# Patient Record
Sex: Female | Born: 1965 | Race: White | Hispanic: No | Marital: Married | Smoking: Former smoker
Health system: Southern US, Community
[De-identification: ages and names within clinical notes are randomized; demographics above are authoritative.]

## PROBLEM LIST (undated history)

## (undated) DIAGNOSIS — E538 Deficiency of other specified B group vitamins: Secondary | ICD-10-CM

## (undated) DIAGNOSIS — R0600 Dyspnea, unspecified: Secondary | ICD-10-CM

## (undated) DIAGNOSIS — K219 Gastro-esophageal reflux disease without esophagitis: Secondary | ICD-10-CM

## (undated) DIAGNOSIS — Z8489 Family history of other specified conditions: Secondary | ICD-10-CM

## (undated) DIAGNOSIS — I739 Peripheral vascular disease, unspecified: Secondary | ICD-10-CM

## (undated) DIAGNOSIS — I493 Ventricular premature depolarization: Secondary | ICD-10-CM

## (undated) DIAGNOSIS — M1990 Unspecified osteoarthritis, unspecified site: Secondary | ICD-10-CM

## (undated) DIAGNOSIS — R2689 Other abnormalities of gait and mobility: Secondary | ICD-10-CM

## (undated) DIAGNOSIS — D509 Iron deficiency anemia, unspecified: Secondary | ICD-10-CM

## (undated) DIAGNOSIS — N028 Recurrent and persistent hematuria with other morphologic changes: Secondary | ICD-10-CM

## (undated) DIAGNOSIS — E039 Hypothyroidism, unspecified: Secondary | ICD-10-CM

## (undated) DIAGNOSIS — I731 Thromboangiitis obliterans [Buerger's disease]: Secondary | ICD-10-CM

## (undated) DIAGNOSIS — E559 Vitamin D deficiency, unspecified: Secondary | ICD-10-CM

## (undated) DIAGNOSIS — D473 Essential (hemorrhagic) thrombocythemia: Secondary | ICD-10-CM

## (undated) DIAGNOSIS — D735 Infarction of spleen: Secondary | ICD-10-CM

## (undated) DIAGNOSIS — D75839 Thrombocytosis, unspecified: Secondary | ICD-10-CM

## (undated) DIAGNOSIS — G4730 Sleep apnea, unspecified: Secondary | ICD-10-CM

## (undated) DIAGNOSIS — K5100 Ulcerative (chronic) pancolitis without complications: Secondary | ICD-10-CM

## (undated) DIAGNOSIS — I10 Essential (primary) hypertension: Secondary | ICD-10-CM

## (undated) HISTORY — DX: Iron deficiency anemia, unspecified: D50.9

## (undated) HISTORY — DX: Essential (hemorrhagic) thrombocythemia: D47.3

## (undated) HISTORY — DX: Thrombocytosis, unspecified: D75.839

## (undated) HISTORY — DX: Thromboangiitis obliterans (Buerger's disease): I73.1

## (undated) HISTORY — PX: APPENDECTOMY: SHX54

## (undated) HISTORY — DX: Deficiency of other specified B group vitamins: E53.8

## (undated) HISTORY — PX: DILATION AND CURETTAGE OF UTERUS: SHX78

## (undated) HISTORY — DX: Ventricular premature depolarization: I49.3

## (undated) HISTORY — PX: CORONARY ARTERY BYPASS GRAFT: SHX141

## (undated) HISTORY — PX: ADENOIDECTOMY: SUR15

## (undated) HISTORY — DX: Unspecified osteoarthritis, unspecified site: M19.90

---
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
## 1995-03-26 DIAGNOSIS — K9189 Other postprocedural complications and disorders of digestive system: Secondary | ICD-10-CM

## 1998-01-14 DIAGNOSIS — K9189 Other postprocedural complications and disorders of digestive system: Secondary | ICD-10-CM

## 2004-07-02 ENCOUNTER — Other Ambulatory Visit: Payer: Self-pay

## 2005-02-28 ENCOUNTER — Emergency Department: Payer: Self-pay | Admitting: Emergency Medicine

## 2005-07-20 ENCOUNTER — Emergency Department: Payer: Self-pay | Admitting: Emergency Medicine

## 2005-07-21 ENCOUNTER — Other Ambulatory Visit: Payer: Self-pay

## 2005-11-02 ENCOUNTER — Ambulatory Visit: Payer: Self-pay

## 2006-10-05 ENCOUNTER — Encounter: Admission: RE | Admit: 2006-10-05 | Discharge: 2006-10-05 | Payer: Self-pay | Admitting: Neurosurgery

## 2008-02-03 ENCOUNTER — Other Ambulatory Visit: Payer: Self-pay

## 2008-02-03 ENCOUNTER — Emergency Department: Payer: Self-pay | Admitting: Emergency Medicine

## 2008-10-08 ENCOUNTER — Ambulatory Visit: Payer: Self-pay | Admitting: Internal Medicine

## 2009-12-13 DIAGNOSIS — N028 Recurrent and persistent hematuria with other morphologic changes: Secondary | ICD-10-CM

## 2009-12-13 DIAGNOSIS — N02B9 Other recurrent and persistent immunoglobulin A nephropathy: Secondary | ICD-10-CM

## 2009-12-13 HISTORY — DX: Recurrent and persistent hematuria with other morphologic changes: N02.8

## 2009-12-13 HISTORY — DX: Other recurrent and persistent immunoglobulin A nephropathy: N02.B9

## 2010-03-24 ENCOUNTER — Emergency Department: Payer: Self-pay | Admitting: Emergency Medicine

## 2010-04-05 ENCOUNTER — Inpatient Hospital Stay: Payer: Self-pay | Admitting: Specialist

## 2010-11-13 ENCOUNTER — Emergency Department: Payer: Self-pay | Admitting: Emergency Medicine

## 2011-07-06 ENCOUNTER — Ambulatory Visit: Payer: Self-pay | Admitting: Internal Medicine

## 2012-09-26 ENCOUNTER — Ambulatory Visit: Payer: Self-pay

## 2012-12-22 ENCOUNTER — Ambulatory Visit: Payer: Self-pay | Admitting: Internal Medicine

## 2012-12-22 LAB — CBC CANCER CENTER
Bands: 1 %
Eosinophil: 2 %
HCT: 32.6 % — ABNORMAL LOW
HGB: 10.5 g/dL — ABNORMAL LOW
Lymphocytes: 24 %
MCH: 24.1 pg — ABNORMAL LOW
MCHC: 32.1 g/dL
MCV: 75 fL — ABNORMAL LOW
Monocytes: 5 %
Platelet: 432 "x10 3/mm "
RBC: 4.35 "x10 6/mm "
RDW: 18.4 % — ABNORMAL HIGH
Segmented Neutrophils: 63 %
Variant Lymphocyte: 5 %
WBC: 10.3 "x10 3/mm "

## 2012-12-22 LAB — IRON AND TIBC
Iron Bind.Cap.(Total): 481 ug/dL — ABNORMAL HIGH
Iron Saturation: 6 %
Iron: 28 ug/dL — ABNORMAL LOW
Unbound Iron-Bind.Cap.: 453 ug/dL

## 2013-01-13 ENCOUNTER — Ambulatory Visit: Payer: Self-pay | Admitting: Internal Medicine

## 2013-02-10 ENCOUNTER — Ambulatory Visit: Payer: Self-pay | Admitting: Internal Medicine

## 2013-03-13 ENCOUNTER — Ambulatory Visit: Payer: Self-pay | Admitting: Internal Medicine

## 2013-03-27 LAB — CBC CANCER CENTER
HCT: 40.3 % (ref 35.0–47.0)
Lymphocyte #: 2.7 x10 3/mm (ref 1.0–3.6)
Lymphocyte %: 32.4 %
MCHC: 32.3 g/dL (ref 32.0–36.0)
MCV: 81 fL (ref 80–100)
Monocyte #: 0.6 x10 3/mm (ref 0.2–0.9)
Monocyte %: 7.2 %
Neutrophil #: 4.9 x10 3/mm (ref 1.4–6.5)
Neutrophil %: 58.3 %
Platelet: 322 x10 3/mm (ref 150–440)
WBC: 8.3 x10 3/mm (ref 3.6–11.0)

## 2013-03-27 LAB — IRON AND TIBC
Iron Bind.Cap.(Total): 427 ug/dL (ref 250–450)
Iron: 41 ug/dL — ABNORMAL LOW (ref 50–170)
Unbound Iron-Bind.Cap.: 386 ug/dL

## 2013-04-12 ENCOUNTER — Ambulatory Visit: Payer: Self-pay | Admitting: Internal Medicine

## 2013-05-11 DIAGNOSIS — E039 Hypothyroidism, unspecified: Secondary | ICD-10-CM | POA: Insufficient documentation

## 2013-05-11 DIAGNOSIS — I10 Essential (primary) hypertension: Secondary | ICD-10-CM | POA: Insufficient documentation

## 2013-06-04 DIAGNOSIS — Z6841 Body Mass Index (BMI) 40.0 and over, adult: Secondary | ICD-10-CM | POA: Insufficient documentation

## 2013-06-04 DIAGNOSIS — I731 Thromboangiitis obliterans [Buerger's disease]: Secondary | ICD-10-CM | POA: Insufficient documentation

## 2013-06-04 DIAGNOSIS — G4730 Sleep apnea, unspecified: Secondary | ICD-10-CM | POA: Insufficient documentation

## 2013-06-04 DIAGNOSIS — K5100 Ulcerative (chronic) pancolitis without complications: Secondary | ICD-10-CM | POA: Insufficient documentation

## 2013-06-04 DIAGNOSIS — K219 Gastro-esophageal reflux disease without esophagitis: Secondary | ICD-10-CM | POA: Insufficient documentation

## 2013-06-12 ENCOUNTER — Ambulatory Visit: Payer: Self-pay | Admitting: Internal Medicine

## 2013-08-15 ENCOUNTER — Ambulatory Visit: Payer: Self-pay | Admitting: Internal Medicine

## 2013-08-15 LAB — CBC CANCER CENTER
Basophil #: 0 x10 3/mm (ref 0.0–0.1)
Basophil %: 0.6 %
Eosinophil #: 0.1 x10 3/mm (ref 0.0–0.7)
Eosinophil %: 1.7 %
HCT: 41.2 % (ref 35.0–47.0)
Lymphocyte %: 33.4 %
MCH: 29.2 pg (ref 26.0–34.0)
MCHC: 33.3 g/dL (ref 32.0–36.0)
Monocyte #: 0.5 x10 3/mm (ref 0.2–0.9)
Monocyte %: 6.5 %
Neutrophil #: 4.7 x10 3/mm (ref 1.4–6.5)
Neutrophil %: 57.8 %
RDW: 14.4 % (ref 11.5–14.5)
WBC: 8.1 x10 3/mm (ref 3.6–11.0)

## 2013-08-15 LAB — IRON AND TIBC
Iron Saturation: 22 %
Iron: 95 ug/dL (ref 50–170)

## 2013-09-12 ENCOUNTER — Ambulatory Visit: Payer: Self-pay | Admitting: Internal Medicine

## 2013-10-03 ENCOUNTER — Ambulatory Visit: Payer: Self-pay

## 2013-10-19 ENCOUNTER — Ambulatory Visit: Payer: Self-pay

## 2014-10-15 ENCOUNTER — Ambulatory Visit: Payer: Self-pay | Admitting: Internal Medicine

## 2014-11-12 ENCOUNTER — Ambulatory Visit: Payer: Self-pay | Admitting: Internal Medicine

## 2014-11-12 ENCOUNTER — Ambulatory Visit: Payer: Self-pay

## 2015-01-03 ENCOUNTER — Ambulatory Visit: Payer: Self-pay | Admitting: Internal Medicine

## 2015-01-08 LAB — CBC CANCER CENTER
BASOS ABS: 0.1 x10 3/mm (ref 0.0–0.1)
BASOS PCT: 1.1 %
EOS ABS: 0.1 x10 3/mm (ref 0.0–0.7)
Eosinophil %: 1.1 %
HCT: 43.7 % (ref 35.0–47.0)
HGB: 13.8 g/dL (ref 12.0–16.0)
LYMPHS PCT: 25.6 %
Lymphocyte #: 2.5 x10 3/mm (ref 1.0–3.6)
MCH: 27 pg (ref 26.0–34.0)
MCHC: 31.7 g/dL — AB (ref 32.0–36.0)
MCV: 85 fL (ref 80–100)
MONOS PCT: 4.7 %
Monocyte #: 0.5 x10 3/mm (ref 0.2–0.9)
NEUTROS PCT: 67.5 %
Neutrophil #: 6.6 x10 3/mm — ABNORMAL HIGH (ref 1.4–6.5)
Platelet: 337 x10 3/mm (ref 150–440)
RBC: 5.13 10*6/uL (ref 3.80–5.20)
RDW: 19.1 % — AB (ref 11.5–14.5)
WBC: 9.8 x10 3/mm (ref 3.6–11.0)

## 2015-01-08 LAB — IRON AND TIBC
Iron Bind.Cap.(Total): 417 ug/dL (ref 250–450)
Iron Saturation: 13 %
Iron: 55 ug/dL (ref 50–170)
Unbound Iron-Bind.Cap.: 362 ug/dL

## 2015-01-08 LAB — FERRITIN: Ferritin (ARMC): 41 ng/mL (ref 8–388)

## 2015-01-13 ENCOUNTER — Ambulatory Visit: Payer: Self-pay | Admitting: Internal Medicine

## 2015-04-23 ENCOUNTER — Other Ambulatory Visit: Payer: Self-pay

## 2015-04-29 ENCOUNTER — Encounter
Admission: RE | Admit: 2015-04-29 | Discharge: 2015-04-29 | Disposition: A | Discharge status: Home / Self Care | Payer: Managed Care, Other (non HMO) | Source: Ambulatory Visit | Attending: Obstetrics and Gynecology | Admitting: Obstetrics and Gynecology

## 2015-04-29 DIAGNOSIS — K219 Gastro-esophageal reflux disease without esophagitis: Secondary | ICD-10-CM | POA: Diagnosis not present

## 2015-04-29 DIAGNOSIS — N939 Abnormal uterine and vaginal bleeding, unspecified: Secondary | ICD-10-CM | POA: Diagnosis present

## 2015-04-29 DIAGNOSIS — Z7982 Long term (current) use of aspirin: Secondary | ICD-10-CM | POA: Diagnosis not present

## 2015-04-29 DIAGNOSIS — F17200 Nicotine dependence, unspecified, uncomplicated: Secondary | ICD-10-CM | POA: Diagnosis not present

## 2015-04-29 DIAGNOSIS — I731 Thromboangiitis obliterans [Buerger's disease]: Secondary | ICD-10-CM | POA: Diagnosis not present

## 2015-04-29 DIAGNOSIS — Z8249 Family history of ischemic heart disease and other diseases of the circulatory system: Secondary | ICD-10-CM | POA: Diagnosis not present

## 2015-04-29 DIAGNOSIS — G4730 Sleep apnea, unspecified: Secondary | ICD-10-CM | POA: Diagnosis not present

## 2015-04-29 DIAGNOSIS — E039 Hypothyroidism, unspecified: Secondary | ICD-10-CM | POA: Diagnosis not present

## 2015-04-29 DIAGNOSIS — Z808 Family history of malignant neoplasm of other organs or systems: Secondary | ICD-10-CM | POA: Diagnosis not present

## 2015-04-29 DIAGNOSIS — N8501 Benign endometrial hyperplasia: Secondary | ICD-10-CM | POA: Diagnosis not present

## 2015-04-29 DIAGNOSIS — Z6841 Body Mass Index (BMI) 40.0 and over, adult: Secondary | ICD-10-CM | POA: Diagnosis not present

## 2015-04-29 DIAGNOSIS — Z79899 Other long term (current) drug therapy: Secondary | ICD-10-CM | POA: Diagnosis not present

## 2015-04-29 DIAGNOSIS — I10 Essential (primary) hypertension: Secondary | ICD-10-CM | POA: Diagnosis not present

## 2015-04-29 DIAGNOSIS — E669 Obesity, unspecified: Secondary | ICD-10-CM | POA: Diagnosis not present

## 2015-04-29 DIAGNOSIS — D649 Anemia, unspecified: Secondary | ICD-10-CM | POA: Diagnosis not present

## 2015-04-29 DIAGNOSIS — Z8349 Family history of other endocrine, nutritional and metabolic diseases: Secondary | ICD-10-CM | POA: Diagnosis not present

## 2015-04-29 HISTORY — DX: Gastro-esophageal reflux disease without esophagitis: K21.9

## 2015-04-29 HISTORY — DX: Essential (primary) hypertension: I10

## 2015-04-29 HISTORY — DX: Hypothyroidism, unspecified: E03.9

## 2015-04-29 HISTORY — DX: Recurrent and persistent hematuria with other morphologic changes: N02.8

## 2015-04-29 HISTORY — DX: Family history of other specified conditions: Z84.89

## 2015-04-29 LAB — BASIC METABOLIC PANEL
Anion gap: 9 (ref 5–15)
BUN: 11 mg/dL (ref 6–20)
CHLORIDE: 101 mmol/L (ref 101–111)
CO2: 28 mmol/L (ref 22–32)
CREATININE: 0.68 mg/dL (ref 0.44–1.00)
Calcium: 8.8 mg/dL — ABNORMAL LOW (ref 8.9–10.3)
GFR calc non Af Amer: >60 mL/min (ref >60)
GLUCOSE: 86 mg/dL (ref 65–99)
POTASSIUM: 4.1 mmol/L (ref 3.5–5.1)
Sodium: 138 mmol/L (ref 135–145)

## 2015-04-29 LAB — DIFFERENTIAL
BASOS PCT: 1 %
Basophils Absolute: 0.1 10*3/uL (ref 0–0.1)
Eosinophils Absolute: 0.1 10*3/uL (ref 0–0.7)
Eosinophils Relative: 1 %
Lymphocytes Relative: 24 %
Lymphs Abs: 3.2 10*3/uL (ref 1.0–3.6)
MONO ABS: 0.8 10*3/uL (ref 0.2–0.9)
MONOS PCT: 6 %
Neutro Abs: 9 10*3/uL — ABNORMAL HIGH (ref 1.4–6.5)
Neutrophils Relative %: 68 %

## 2015-04-29 LAB — CBC
HCT: 41.7 % (ref 35.0–47.0)
HEMOGLOBIN: 13.4 g/dL (ref 12.0–16.0)
MCH: 28.5 pg (ref 26.0–34.0)
MCHC: 32.1 g/dL (ref 32.0–36.0)
MCV: 88.8 fL (ref 80.0–100.0)
Platelets: 357 10*3/uL (ref 150–440)
RBC: 4.69 MIL/uL (ref 3.80–5.20)
RDW: 15.8 % — ABNORMAL HIGH (ref 11.5–14.5)
WBC: 13.2 10*3/uL — AB (ref 3.6–11.0)

## 2015-04-29 NOTE — Patient Instructions (Signed)
  Your procedure is scheduled on: May 02, 2015 Report to Same Day Surgery. To find out your arrival time please call [phone] between 1PM - 3PM on May 01, 2015.  Remember: Instructions that are not followed completely may result in serious medical risk, up to and including death, or upon the discretion of your surgeon and anesthesiologist your surgery may need to be rescheduled.    __x__ 1. Do not eat food or drink liquids after midnight. No gum chewing or hard candies.     __x__ 2. No Alcohol for 24 hours before or after surgery.   ____ 3. Bring all medications with you on the day of surgery if instructed.    __x__ 4. Notify your doctor if there is any change in your medical condition     (cold, fever, infections).     Do not wear jewelry, make-up, hairpins, clips or nail polish.  Do not wear lotions, powders, or perfumes. You may wear deodorant.  Do not shave 48 hours prior to surgery. Men may shave face and neck.  Do not bring valuables to the hospital.    [facility] is not responsible for any belongings or valuables.               Contacts, dentures or bridgework may not be worn into surgery.  Leave your suitcase in the car. After surgery it may be brought to your room.  For patients admitted to the hospital, discharge time is determined by your treatment team.   Patients discharged the day of surgery will not be allowed to drive home.   Please read over the following fact sheets that you were given:     __x__ Take these medicines the morning of surgery with A SIP OF WATER:    1. Levothyroxine Sodium (LEVOXYL PO)     ____ Fleet Enema (as directed)   ____ Use CHG Soap as directed  ____ Use inhalers on the day of surgery  ____ Stop metformin 2 days prior to surgery    ____ Take 1/2 of usual insulin dose the night before surgery and none on the morning of surgery.   ____ Stop Coumadin/Plavix/aspirin on Does not apply.  ____ Stop Anti-inflammatories on does not  apply, patient does not take.   ____ Stop supplements until after surgery.    ____ Bring C-Pap to the hospital.

## 2015-05-02 ENCOUNTER — Ambulatory Visit: Payer: Managed Care, Other (non HMO) | Admitting: Anesthesiology

## 2015-05-02 ENCOUNTER — Encounter: Payer: Self-pay | Admitting: *Deleted

## 2015-05-02 ENCOUNTER — Encounter
Admission: RE | Disposition: A | Discharge status: Home / Self Care | Payer: Self-pay | Source: Ambulatory Visit | Attending: Obstetrics and Gynecology

## 2015-05-02 ENCOUNTER — Ambulatory Visit
Admission: RE | Admit: 2015-05-02 | Discharge: 2015-05-02 | Disposition: A | Discharge status: Home / Self Care | Payer: Managed Care, Other (non HMO) | Source: Ambulatory Visit | Attending: Obstetrics and Gynecology | Admitting: Obstetrics and Gynecology

## 2015-05-02 DIAGNOSIS — E039 Hypothyroidism, unspecified: Secondary | ICD-10-CM | POA: Insufficient documentation

## 2015-05-02 DIAGNOSIS — Z6841 Body Mass Index (BMI) 40.0 and over, adult: Secondary | ICD-10-CM | POA: Insufficient documentation

## 2015-05-02 DIAGNOSIS — Z79899 Other long term (current) drug therapy: Secondary | ICD-10-CM | POA: Insufficient documentation

## 2015-05-02 DIAGNOSIS — I10 Essential (primary) hypertension: Secondary | ICD-10-CM | POA: Insufficient documentation

## 2015-05-02 DIAGNOSIS — N8501 Benign endometrial hyperplasia: Secondary | ICD-10-CM | POA: Insufficient documentation

## 2015-05-02 DIAGNOSIS — Z8249 Family history of ischemic heart disease and other diseases of the circulatory system: Secondary | ICD-10-CM | POA: Insufficient documentation

## 2015-05-02 DIAGNOSIS — D649 Anemia, unspecified: Secondary | ICD-10-CM | POA: Insufficient documentation

## 2015-05-02 DIAGNOSIS — E669 Obesity, unspecified: Secondary | ICD-10-CM | POA: Insufficient documentation

## 2015-05-02 DIAGNOSIS — G4730 Sleep apnea, unspecified: Secondary | ICD-10-CM | POA: Insufficient documentation

## 2015-05-02 DIAGNOSIS — I731 Thromboangiitis obliterans [Buerger's disease]: Secondary | ICD-10-CM | POA: Insufficient documentation

## 2015-05-02 DIAGNOSIS — K219 Gastro-esophageal reflux disease without esophagitis: Secondary | ICD-10-CM | POA: Insufficient documentation

## 2015-05-02 DIAGNOSIS — Z7982 Long term (current) use of aspirin: Secondary | ICD-10-CM | POA: Insufficient documentation

## 2015-05-02 DIAGNOSIS — Z8349 Family history of other endocrine, nutritional and metabolic diseases: Secondary | ICD-10-CM | POA: Insufficient documentation

## 2015-05-02 DIAGNOSIS — N939 Abnormal uterine and vaginal bleeding, unspecified: Secondary | ICD-10-CM | POA: Insufficient documentation

## 2015-05-02 DIAGNOSIS — F17200 Nicotine dependence, unspecified, uncomplicated: Secondary | ICD-10-CM | POA: Insufficient documentation

## 2015-05-02 DIAGNOSIS — Z808 Family history of malignant neoplasm of other organs or systems: Secondary | ICD-10-CM | POA: Insufficient documentation

## 2015-05-02 HISTORY — PX: DILATATION & CURETTAGE/HYSTEROSCOPY WITH MYOSURE: SHX6511

## 2015-05-02 LAB — POCT PREGNANCY, URINE: PREG TEST UR: NEGATIVE

## 2015-05-02 SURGERY — DILATATION & CURETTAGE/HYSTEROSCOPY WITH MYOSURE
Anesthesia: General

## 2015-05-02 MED ORDER — FENTANYL CITRATE (PF) 100 MCG/2ML IJ SOLN
INTRAMUSCULAR | Status: DC | PRN
  Administered 2015-05-02: 50 ug via INTRAVENOUS
  Administered 2015-05-02 (×2): 25 ug via INTRAVENOUS

## 2015-05-02 MED ORDER — FAMOTIDINE 20 MG PO TABS
ORAL_TABLET | ORAL | Status: AC
  Administered 2015-05-02: 20 mg via ORAL
  Filled 2015-05-02: qty 1

## 2015-05-02 MED ORDER — LACTATED RINGERS IV SOLN
INTRAVENOUS | Status: DC
  Administered 2015-05-02: 12:00:00 via INTRAVENOUS

## 2015-05-02 MED ORDER — ONDANSETRON HCL 4 MG/2ML IJ SOLN
INTRAMUSCULAR | Status: DC | PRN
  Administered 2015-05-02: 4 mg via INTRAVENOUS

## 2015-05-02 MED ORDER — DEXAMETHASONE SODIUM PHOSPHATE 10 MG/ML IJ SOLN
INTRAMUSCULAR | Status: DC | PRN
  Administered 2015-05-02: 8 mg via INTRAVENOUS

## 2015-05-02 MED ORDER — KETOROLAC TROMETHAMINE 30 MG/ML IJ SOLN
INTRAMUSCULAR | Status: DC | PRN
  Administered 2015-05-02: 30 mg via INTRAVENOUS

## 2015-05-02 MED ORDER — LIDOCAINE HCL (CARDIAC) 20 MG/ML IV SOLN
INTRAVENOUS | Status: DC | PRN
  Administered 2015-05-02: 80 mg via INTRAVENOUS

## 2015-05-02 MED ORDER — HYDROCODONE-ACETAMINOPHEN 5-325 MG PO TABS: 1.0000 | ORAL_TABLET | Freq: Four times a day (QID) | ORAL | Status: DC | PRN

## 2015-05-02 MED ORDER — IBUPROFEN 600 MG PO TABS: 600.0000 mg | ORAL_TABLET | Freq: Four times a day (QID) | ORAL | Status: DC | PRN

## 2015-05-02 MED ORDER — FAMOTIDINE 20 MG PO TABS
20.0000 mg | ORAL_TABLET | Freq: Once | ORAL | Status: AC
  Administered 2015-05-02: 20 mg via ORAL

## 2015-05-02 MED ORDER — MIDAZOLAM HCL 5 MG/5ML IJ SOLN
INTRAMUSCULAR | Status: DC | PRN
  Administered 2015-05-02: 2 mg via INTRAVENOUS

## 2015-05-02 MED ORDER — ACETAMINOPHEN 10 MG/ML IV SOLN
INTRAVENOUS | Status: AC
  Filled 2015-05-02: qty 100

## 2015-05-02 MED ORDER — ONDANSETRON HCL 4 MG/2ML IJ SOLN: 4.0000 mg | Freq: Once | INTRAMUSCULAR | Status: DC | PRN

## 2015-05-02 MED ORDER — ACETAMINOPHEN 10 MG/ML IV SOLN
INTRAVENOUS | Status: DC | PRN
  Administered 2015-05-02: 1000 mg via INTRAVENOUS

## 2015-05-02 MED ORDER — PROPOFOL 10 MG/ML IV BOLUS
INTRAVENOUS | Status: DC | PRN
  Administered 2015-05-02: 200 mg via INTRAVENOUS

## 2015-05-02 MED ORDER — HYDROMORPHONE HCL 1 MG/ML IJ SOLN: 0.2500 mg | INTRAMUSCULAR | Status: DC | PRN

## 2015-05-02 MED ORDER — FENTANYL CITRATE (PF) 100 MCG/2ML IJ SOLN: 25.0000 ug | INTRAMUSCULAR | Status: DC | PRN

## 2015-05-02 SURGICAL SUPPLY — 22 items
ABLATOR ENDOMETRIAL MYOSURE (ABLATOR) ×3 IMPLANT
CANISTER SUC SOCK COL 7IN (MISCELLANEOUS) ×3 IMPLANT
CATH ROBINSON RED A/P 16FR (CATHETERS) ×3 IMPLANT
COVER LIGHT HANDLE STERIS (MISCELLANEOUS) ×3 IMPLANT
GLOVE BIO SURGEON STRL SZ7 (GLOVE) ×3 IMPLANT
GOWN STRL REUS W/ TWL LRG LVL3 (GOWN DISPOSABLE) ×2 IMPLANT
GOWN STRL REUS W/TWL LRG LVL3 (GOWN DISPOSABLE) ×4
IV NS 1000ML (IV SOLUTION) ×4
IV NS 1000ML BAXH (IV SOLUTION) ×2 IMPLANT
KIT RM TURNOVER CYSTO AR (KITS) ×3 IMPLANT
MYOSURE LITE POLYP REMOVAL (MISCELLANEOUS) ×3 IMPLANT
PACK DNC HYST (MISCELLANEOUS) ×3 IMPLANT
PAD GROUND ADULT SPLIT (MISCELLANEOUS) ×3 IMPLANT
PAD OB MATERNITY 4.3X12.25 (PERSONAL CARE ITEMS) ×3 IMPLANT
PAD PREP 24X41 OB/GYN DISP (PERSONAL CARE ITEMS) ×3 IMPLANT
SEAL ROD LENS SCOPE MYOSURE (ABLATOR) ×3 IMPLANT
SOL .9 NS 3000ML IRR  AL (IV SOLUTION)
SOL .9 NS 3000ML IRR UROMATIC (IV SOLUTION) IMPLANT
TOWEL OR 17X26 4PK STRL BLUE (TOWEL DISPOSABLE) ×3 IMPLANT
TUBING CONNECTING 10 (TUBING) ×2 IMPLANT
TUBING CONNECTING 10' (TUBING) ×1
TUBING HYSTEROSCOPY DOLPHIN (MISCELLANEOUS) IMPLANT

## 2015-05-02 NOTE — Transfer of Care (Signed)
Immediate Anesthesia Transfer of Care Note  Patient: [name]  Procedure(s) Performed: Procedure(s): DILATATION & CURETTAGE/HYSTEROSCOPY WITH MYOSURE (N/A)  Patient Location: PACU  Anesthesia Type:General  Level of Consciousness: awake, alert  and oriented  Airway & Oxygen Therapy: Patient Spontanous Breathing and Patient connected to face mask oxygen  Post-op Assessment: Report given to RN, Post -op Vital signs reviewed and stable and Patient moving all extremities X 4  Post vital signs: Reviewed and stable  Last Vitals:  Filed Vitals:   05/02/15 1109  BP: 123/76  Pulse: 83  Temp: 37.2 C  Resp: 16    Complications: No apparent anesthesia complications

## 2015-05-02 NOTE — Op Note (Signed)
Patient Name: [name] Date of Procedure: 05/02/2015  Preoperative Diagnosis: 1) 49 y.o. with abnormal uterine bleeding, polypoid fragment on in office D&C  Postoperative Diagnosis: 1) 49 y.o. with abnormal uterine bleeding, normal cavity contour  Operation Performed: Hysteroscopy, dilation and curettage  Indication: abnormal uterine bleeding, concern for endometrial polyp on in office biopsy  Anesthesia: .General  Primary Surgeon: Malachy Mood, MD  Assistant: none  Preoperative Antibiotics: none  Estimated Blood Loss: * No blood loss amount entered *  IV Fluids: 486mL  Urine Output:: ~16mL straight cath  Drains or Tubes: none  Implants: none  Specimens Removed: endometrial curettings  Complications: none  Intraoperative Findings:  Normal uterine cavity, grossly normal endometrial lining  Patient Condition: stable  Procedure in Detail:  Patient was taken to the operating room were she was administered general endotracheal anesthesia.  She was positioned in the dorsal lithotomy position utilizing Allen stirups, prepped and draped in the usual sterile fashion.  Bimanual exam limited by habitus.   Prior to proceeding with the case a time out was performed.  Attention was turned to the patient's pelvis.  A red rubber catheter was used to empty the patient's bladder.  An operative speculum was placed to allow visualization of the cervix.  The anterior lip of the cervix was grasped with a single tooth tenaculum and the the cervix was dilated using direct entry with the myosure hysteroscope.  The hysteroscope was then advanced into the uterine cavity, the operative speculum had to be removed secondary to patient's habitus allowing the scope to be advanced sufficiently into the uterine cavity.   Inspection noted the above findings.  Curettage was performed using the mysure system and the resulting specimen collected and sent to pathology.    The single tooth tenaculum was  removed from the cervix.  The tenaculum sites and cervix were noted to be  Hemostatic before removing the operative speculum.  Sponge needle and instrument counts were corrects times two.  The patient tolerated the procedure well and was taken to the recovery room in stable condition.

## 2015-05-02 NOTE — Discharge Summary (Signed)
  Gynecology Physician Postoperative Discharge Summary  Patient ID: ALIYAH ABEYTA MRN: [id number] DOB/AGE: 07/30/1966 49 y.o.  Admit Date: 05/02/2015 Discharge Date: 05/02/2015  Preoperative Diagnoses: Endometrial polyp, abnormal uterine bleeding  Procedures: Procedure(s) (LRB): DILATATION & CURETTAGE/HYSTEROSCOPY WITH MYOSURE (N/A)  CBC Latest Ref Rng 04/29/2015 01/08/2015 08/15/2013  WBC 3.6 - 11.0 K/uL 13.2(H) 9.8 8.1  Hemoglobin 12.0 - 16.0 g/dL 13.4 13.8 13.7  Hematocrit 35.0 - 47.0 % 41.7 43.7 41.2  Platelets 150 - 440 K/uL 357 337 319    Hospital Course:  SHEMAIAH ROUND is a 49 y.o. No obstetric history on file.  admitted for scheduled surgery.  She underwent the procedures as mentioned above, her operation was uncomplicated. For further details about surgery, please refer to the operative report. Patient had an uncomplicated postoperative course. By time of discharge on POD#0, her pain was controlled on oral pain medications; she was ambulating, voiding without difficulty, tolerating regular diet and passing flatus. She was deemed stable for discharge to home.   Discharged Condition: Stable  Disposition: home     Medication List    TAKE these medications        amLODipine 10 MG tablet  Commonly known as:  NORVASC  Take 10 mg by mouth at bedtime.     aspirin 81 MG tablet  Take 81 mg by mouth at bedtime.     folic acid 1 MG tablet  Commonly known as:  FOLVITE  Take 1 mg by mouth at bedtime.     HYDROcodone-acetaminophen 5-325 MG per tablet  Commonly known as:  NORCO/VICODIN  Take 1 tablet by mouth every 6 (six) hours as needed.     ibuprofen 600 MG tablet  Commonly known as:  ADVIL,MOTRIN  Take 1 tablet (600 mg total) by mouth every 6 (six) hours as needed.     LEVOXYL PO  Take 225 mcg by mouth every morning.     omeprazole 40 MG capsule  Commonly known as:  PRILOSEC  Take 40 mg by mouth at bedtime.     VITAMIN D3 COMPLETE PO  Take 1 capsule by mouth 2 (two)  times a week.

## 2015-05-02 NOTE — Anesthesia Postprocedure Evaluation (Signed)
  Anesthesia Post-op Note  Patient: [name]  Procedure(s) Performed: Procedure(s): DILATATION & CURETTAGE/HYSTEROSCOPY WITH MYOSURE (N/A)  Anesthesia type:General LMA  Patient location: PACU  Post pain: Pain level controlled  Post assessment: Post-op Vital signs reviewed, Patient's Cardiovascular Status Stable, Respiratory Function Stable, Patent Airway and No signs of Nausea or vomiting  Post vital signs: Reviewed and stable  Last Vitals:  Filed Vitals:   05/02/15 1356  BP: 128/79  Pulse: 76  Temp: 36.4 C  Resp:     Level of consciousness: awake, alert  and patient cooperative  Complications: No apparent anesthesia complications

## 2015-05-02 NOTE — Discharge Instructions (Signed)
AMBULATORY SURGERY  DISCHARGE INSTRUCTIONS   1) The drugs that you were given will stay in your system until tomorrow so for the next 24 hours you should not:  A) Drive an automobile B) Make any legal decisions C) Drink any alcoholic beverage   2) You may resume regular meals tomorrow.  Today it is better to start with liquids and gradually work up to solid foods.  You may eat anything you prefer, but it is better to start with liquids, then soup and crackers, and gradually work up to solid foods.   3) Please notify your doctor immediately if you have any unusual bleeding, trouble breathing, redness and pain at the surgery site, drainage, fever, or pain not relieved by medication. 4)   5) Your post-operative visit with Dr.    George Ina                                 is: Date:                        Time:    Please call to schedule your post-operative visit.  6) Additional Instructions: 7)

## 2015-05-02 NOTE — H&P (Signed)
Date of Initial H&P: 04/29/2015  History reviewed, patient examined, no change in status, stable for surgery.

## 2015-05-02 NOTE — Progress Notes (Signed)
preop report given to Gustavo Lah, RN by Phillips Grout, RN

## 2015-05-02 NOTE — Brief Op Note (Signed)
05/02/2015  12:42 PM  PATIENT:  [name]  49 y.o. female  PRE-OPERATIVE DIAGNOSIS:  Endometrial polyp  POST-OPERATIVE DIAGNOSIS:  same  PROCEDURE:  Procedure(s): DILATATION & CURETTAGE/HYSTEROSCOPY WITH MYOSURE (N/A)  SURGEON:  Surgeon(s) and Role:    * Malachy Mood, MD - Primary  PHYSICIAN ASSISTANT: none  ASSISTANTS: none  ANESTHESIA:   general  EBL:     BLOOD ADMINISTERED:none  DRAINS: none   LOCAL MEDICATIONS USED:  NONE  SPECIMEN:  Source of Specimen:  endometrial curretings  DISPOSITION OF SPECIMEN:  PATHOLOGY  COUNTS:  YES  TOURNIQUET:  * No tourniquets in log *  DICTATION: .Note written in EPIC  PLAN OF CARE: Discharge to home after PACU  PATIENT DISPOSITION:  PACU - hemodynamically stable.   Delay start of Pharmacological VTE agent (>24hrs) due to surgical blood loss or risk of bleeding: no

## 2015-05-02 NOTE — Anesthesia Procedure Notes (Signed)
Procedure Name: LMA Insertion Date/Time: 05/02/2015 12:11 PM Performed by: Delaney Meigs Pre-anesthesia Checklist: Patient identified, Emergency Drugs available, Suction available, Patient being monitored and Timeout performed Patient Re-evaluated:Patient Re-evaluated prior to inductionOxygen Delivery Method: Circle system utilized Preoxygenation: Pre-oxygenation with 100% oxygen Intubation Type: IV induction Ventilation: Mask ventilation without difficulty LMA: LMA inserted LMA Size: 3.5 Number of attempts: 1 Placement Confirmation: positive ETCO2 and breath sounds checked- equal and bilateral Tube secured with: Tape Dental Injury: Teeth and Oropharynx as per pre-operative assessment

## 2015-05-02 NOTE — Anesthesia Preprocedure Evaluation (Signed)
Anesthesia Evaluation  Patient identified by MRN, date of birth, ID band Patient awake    Reviewed: Allergy & Precautions, H&P , NPO status , Patient's Chart, lab work & pertinent test results, reviewed documented beta blocker date and time   Airway Mallampati: II  TM Distance: >3 FB Neck ROM: full    Dental   Pulmonary sleep apnea and Continuous Positive Airway Pressure Ventilation , Current Smoker, former smoker,          Cardiovascular hypertension, Rate:Normal     Neuro/Psych    GI/Hepatic GERD-  ,  Endo/Other    Renal/GU      Musculoskeletal   Abdominal   Peds  Hematology  (+) anemia ,   Anesthesia Other Findings   Reproductive/Obstetrics                             Anesthesia Physical Anesthesia Plan  ASA: III  Anesthesia Plan: General LMA   Post-op Pain Management:    Induction:   Airway Management Planned:   Additional Equipment:   Intra-op Plan:   Post-operative Plan:   Informed Consent: I have reviewed the patients History and Physical, chart, labs and discussed the procedure including the risks, benefits and alternatives for the proposed anesthesia with the patient or authorized representative who has indicated his/her understanding and acceptance.     Plan Discussed with: CRNA  Anesthesia Plan Comments:         Anesthesia Quick Evaluation

## 2015-05-05 ENCOUNTER — Encounter: Payer: Self-pay | Admitting: Obstetrics and Gynecology

## 2015-05-07 LAB — SURGICAL PATHOLOGY

## 2015-06-24 ENCOUNTER — Other Ambulatory Visit: Payer: Self-pay

## 2015-06-24 DIAGNOSIS — D509 Iron deficiency anemia, unspecified: Secondary | ICD-10-CM

## 2015-06-25 ENCOUNTER — Inpatient Hospital Stay: Payer: Managed Care, Other (non HMO) | Attending: Internal Medicine

## 2015-09-17 ENCOUNTER — Inpatient Hospital Stay: Payer: Managed Care, Other (non HMO) | Attending: Internal Medicine

## 2015-10-16 ENCOUNTER — Inpatient Hospital Stay: Payer: Managed Care, Other (non HMO) | Attending: Internal Medicine

## 2015-10-16 DIAGNOSIS — N840 Polyp of corpus uteri: Secondary | ICD-10-CM | POA: Insufficient documentation

## 2015-10-16 DIAGNOSIS — D509 Iron deficiency anemia, unspecified: Secondary | ICD-10-CM

## 2015-10-16 DIAGNOSIS — N939 Abnormal uterine and vaginal bleeding, unspecified: Secondary | ICD-10-CM | POA: Diagnosis present

## 2015-10-16 LAB — IRON AND TIBC
IRON: 39 ug/dL (ref 28–170)
Saturation Ratios: 10 % — ABNORMAL LOW (ref 10.4–31.8)
TIBC: 408 ug/dL (ref 250–450)
UIBC: 369 ug/dL

## 2015-10-16 LAB — CBC
HCT: 42.3 % (ref 35.0–47.0)
Hemoglobin: 13.8 g/dL (ref 12.0–16.0)
MCH: 27.6 pg (ref 26.0–34.0)
MCHC: 32.7 g/dL (ref 32.0–36.0)
MCV: 84.5 fL (ref 80.0–100.0)
PLATELETS: 359 10*3/uL (ref 150–440)
RBC: 5.01 MIL/uL (ref 3.80–5.20)
RDW: 15.5 % — AB (ref 11.5–14.5)
WBC: 12.9 10*3/uL — ABNORMAL HIGH (ref 3.6–11.0)

## 2015-10-16 LAB — FERRITIN: FERRITIN: 26 ng/mL (ref 11–307)

## 2015-10-21 ENCOUNTER — Other Ambulatory Visit: Payer: Self-pay | Admitting: *Deleted

## 2015-10-21 DIAGNOSIS — D509 Iron deficiency anemia, unspecified: Secondary | ICD-10-CM

## 2015-12-24 ENCOUNTER — Ambulatory Visit: Payer: Self-pay | Admitting: Internal Medicine

## 2015-12-24 ENCOUNTER — Other Ambulatory Visit: Payer: Self-pay

## 2015-12-30 ENCOUNTER — Other Ambulatory Visit: Payer: Self-pay | Admitting: *Deleted

## 2015-12-30 DIAGNOSIS — D509 Iron deficiency anemia, unspecified: Secondary | ICD-10-CM

## 2015-12-31 ENCOUNTER — Encounter: Payer: Self-pay | Admitting: *Deleted

## 2016-01-01 ENCOUNTER — Inpatient Hospital Stay: Payer: Managed Care, Other (non HMO) | Admitting: Internal Medicine

## 2016-01-01 ENCOUNTER — Inpatient Hospital Stay: Payer: Managed Care, Other (non HMO)

## 2016-01-12 ENCOUNTER — Inpatient Hospital Stay: Payer: Managed Care, Other (non HMO)

## 2016-01-12 ENCOUNTER — Encounter: Payer: Self-pay | Admitting: *Deleted

## 2016-01-12 ENCOUNTER — Inpatient Hospital Stay: Payer: Managed Care, Other (non HMO) | Admitting: Internal Medicine

## 2016-01-19 ENCOUNTER — Other Ambulatory Visit: Payer: Managed Care, Other (non HMO)

## 2016-01-19 ENCOUNTER — Encounter: Payer: Self-pay | Admitting: *Deleted

## 2016-01-19 ENCOUNTER — Inpatient Hospital Stay: Payer: Managed Care, Other (non HMO) | Attending: Internal Medicine

## 2016-01-19 ENCOUNTER — Inpatient Hospital Stay: Payer: Managed Care, Other (non HMO) | Admitting: Internal Medicine

## 2016-01-19 NOTE — Progress Notes (Signed)
Pt no show with Dr. Rogue Bussing. She has missed multiple appointments. Per MD, a final discharge letter will be written. MD requests that this appointment not be rescheduled.  Cancer center scheduling made aware.

## 2016-07-06 HISTORY — PX: LAPAROSCOPIC GASTRIC SLEEVE RESECTION: SHX5895

## 2016-10-21 ENCOUNTER — Other Ambulatory Visit: Payer: Self-pay | Admitting: Internal Medicine

## 2016-10-21 DIAGNOSIS — Z1231 Encounter for screening mammogram for malignant neoplasm of breast: Secondary | ICD-10-CM

## 2016-11-24 ENCOUNTER — Ambulatory Visit: Payer: Managed Care, Other (non HMO)

## 2016-12-17 ENCOUNTER — Ambulatory Visit: Payer: Managed Care, Other (non HMO)

## 2017-01-13 ENCOUNTER — Ambulatory Visit: Payer: Managed Care, Other (non HMO)

## 2017-02-09 ENCOUNTER — Inpatient Hospital Stay: Admission: RE | Admit: 2017-02-09 | Payer: Managed Care, Other (non HMO) | Source: Ambulatory Visit

## 2017-02-23 ENCOUNTER — Ambulatory Visit: Payer: Self-pay | Admitting: Obstetrics and Gynecology

## 2017-03-12 ENCOUNTER — Telehealth: Payer: Self-pay | Admitting: Obstetrics and Gynecology

## 2017-03-12 NOTE — Telephone Encounter (Signed)
Called by After-hours service who received a call from the patient. The patient requested antibiotics for a UTI. Per the after-hours service, the patient is bedridden with disability and can only take 16 steps without becoming winded.  She states we have treated her for UTIs in the past.   I reviewed her medical records inasmuch as I have access. I reviewed what I could find in Epic and in our prior system, [facility], where she is more likely to have records with our practice.  In 11/2015, she was treated after calling in for a UTI by Dr Leonides Schanz (then with our practice) with Cipro.  She has had no appointments since that time. She is scheduled to have an appointment on 03/29/17 with Dr. Georgianne Fick.   I declined the medication request as I have never personally met the patient and as she describes a medical condition (being bedridden) which is inconsistent with her most recent clinic note (10/2015 with Dr. Georgianne Fick), there is reason to believe she has newer medical conditions for which I can not account. In addition, she has rescheduled/cancelled six appointments with Dr. Georgianne Fick since that time.   I have asked the on-call nurse to pass along my recommendation to the patient that she be seen and evaluated for this condition so that she may be fully assessed in the context of her current medical state.

## 2017-03-29 ENCOUNTER — Ambulatory Visit: Payer: Self-pay | Admitting: Obstetrics and Gynecology

## 2017-03-29 ENCOUNTER — Encounter: Payer: Self-pay | Admitting: Obstetrics and Gynecology

## 2017-04-13 ENCOUNTER — Ambulatory Visit: Payer: Self-pay | Admitting: Obstetrics and Gynecology

## 2017-10-11 ENCOUNTER — Other Ambulatory Visit: Payer: Self-pay | Admitting: Nurse Practitioner

## 2017-10-11 DIAGNOSIS — R1013 Epigastric pain: Secondary | ICD-10-CM

## 2017-10-21 ENCOUNTER — Other Ambulatory Visit
Admission: RE | Admit: 2017-10-21 | Discharge: 2017-10-21 | Disposition: A | Discharge status: Home / Self Care | Payer: 59 | Source: Ambulatory Visit | Attending: Nurse Practitioner | Admitting: Nurse Practitioner

## 2017-10-21 DIAGNOSIS — E559 Vitamin D deficiency, unspecified: Secondary | ICD-10-CM | POA: Insufficient documentation

## 2017-10-21 DIAGNOSIS — E039 Hypothyroidism, unspecified: Secondary | ICD-10-CM | POA: Diagnosis not present

## 2017-10-21 DIAGNOSIS — R7301 Impaired fasting glucose: Secondary | ICD-10-CM | POA: Insufficient documentation

## 2017-10-21 DIAGNOSIS — E538 Deficiency of other specified B group vitamins: Secondary | ICD-10-CM | POA: Diagnosis not present

## 2017-10-21 DIAGNOSIS — Z9884 Bariatric surgery status: Secondary | ICD-10-CM | POA: Insufficient documentation

## 2017-10-21 LAB — CBC
HEMATOCRIT: 45.8 % (ref 35.0–47.0)
Hemoglobin: 15.1 g/dL (ref 12.0–16.0)
MCH: 30.2 pg (ref 26.0–34.0)
MCHC: 33 g/dL (ref 32.0–36.0)
MCV: 91.5 fL (ref 80.0–100.0)
Platelets: 375 10*3/uL (ref 150–440)
RBC: 5.01 MIL/uL (ref 3.80–5.20)
RDW: 14.4 % (ref 11.5–14.5)
WBC: 11.6 10*3/uL — AB (ref 3.6–11.0)

## 2017-10-21 LAB — COMPREHENSIVE METABOLIC PANEL
ALT: 31 U/L (ref 14–54)
AST: 29 U/L (ref 15–41)
Albumin: 4.1 g/dL (ref 3.5–5.0)
Alkaline Phosphatase: 114 U/L (ref 38–126)
Anion gap: 12 (ref 5–15)
BILIRUBIN TOTAL: 0.9 mg/dL (ref 0.3–1.2)
BUN: 11 mg/dL (ref 6–20)
CO2: 24 mmol/L (ref 22–32)
Calcium: 9.3 mg/dL (ref 8.9–10.3)
Chloride: 100 mmol/L — ABNORMAL LOW (ref 101–111)
Creatinine, Ser: 0.63 mg/dL (ref 0.44–1.00)
GFR calc Af Amer: >60 mL/min (ref >60)
GFR calc non Af Amer: >60 mL/min (ref >60)
Glucose, Bld: 87 mg/dL (ref 65–99)
POTASSIUM: 3.7 mmol/L (ref 3.5–5.1)
Sodium: 136 mmol/L (ref 135–145)
TOTAL PROTEIN: 8.7 g/dL — AB (ref 6.5–8.1)

## 2017-10-21 LAB — HEMOGLOBIN A1C
HEMOGLOBIN A1C: 5.2 % (ref 4.8–5.6)
MEAN PLASMA GLUCOSE: 102.54 mg/dL

## 2017-10-21 LAB — T4, FREE: FREE T4: 1.26 ng/dL — AB (ref 0.61–1.12)

## 2017-10-21 LAB — VITAMIN B12: Vitamin B-12: 2768 pg/mL — ABNORMAL HIGH (ref 180–914)

## 2017-10-21 LAB — LIPID PANEL
CHOLESTEROL: 163 mg/dL (ref 0–200)
HDL: 51 mg/dL (ref >40)
LDL Cholesterol: 93 mg/dL (ref 0–99)
Total CHOL/HDL Ratio: 3.2 RATIO
Triglycerides: 96 mg/dL (ref <150)
VLDL: 19 mg/dL (ref 0–40)

## 2017-10-21 LAB — MAGNESIUM: MAGNESIUM: 1.9 mg/dL (ref 1.7–2.4)

## 2017-10-21 LAB — PHOSPHORUS: Phosphorus: 2.4 mg/dL — ABNORMAL LOW (ref 2.5–4.6)

## 2017-10-21 LAB — FOLATE: FOLATE: 41 ng/mL (ref >5.9)

## 2017-10-21 LAB — FERRITIN: Ferritin: 81 ng/mL (ref 11–307)

## 2017-10-21 LAB — TSH: TSH: 0.454 u[IU]/mL (ref 0.350–4.500)

## 2017-10-22 LAB — VITAMIN D 25 HYDROXY (VIT D DEFICIENCY, FRACTURES): Vit D, 25-Hydroxy: 41.3 ng/mL (ref 30.0–100.0)

## 2017-10-24 ENCOUNTER — Ambulatory Visit: Payer: Managed Care, Other (non HMO)

## 2017-10-24 ENCOUNTER — Ambulatory Visit: Admission: RE | Admit: 2017-10-24 | Payer: Managed Care, Other (non HMO) | Source: Ambulatory Visit

## 2017-10-25 LAB — MISC LABCORP TEST (SEND OUT): LABCORP TEST CODE: 4655

## 2017-11-22 ENCOUNTER — Other Ambulatory Visit: Payer: Self-pay

## 2017-11-22 ENCOUNTER — Encounter: Payer: Self-pay | Admitting: Advanced Practice Midwife

## 2017-11-22 ENCOUNTER — Ambulatory Visit (INDEPENDENT_AMBULATORY_CARE_PROVIDER_SITE_OTHER): Payer: 59 | Admitting: Advanced Practice Midwife

## 2017-11-22 VITALS — BP 116/76 | HR 80 | Ht 67.5 in | Wt 342.0 lb

## 2017-11-22 DIAGNOSIS — N898 Other specified noninflammatory disorders of vagina: Secondary | ICD-10-CM | POA: Diagnosis not present

## 2017-11-22 DIAGNOSIS — B9689 Other specified bacterial agents as the cause of diseases classified elsewhere: Secondary | ICD-10-CM | POA: Diagnosis not present

## 2017-11-22 DIAGNOSIS — N760 Acute vaginitis: Secondary | ICD-10-CM | POA: Diagnosis not present

## 2017-11-22 DIAGNOSIS — B379 Candidiasis, unspecified: Secondary | ICD-10-CM

## 2017-11-22 MED ORDER — METRONIDAZOLE 500 MG PO TABS: 500.0000 mg | ORAL_TABLET | Freq: Two times a day (BID) | ORAL | 0 refills | Status: AC

## 2017-11-22 MED ORDER — FLUCONAZOLE 150 MG PO TABS: 150.0000 mg | ORAL_TABLET | Freq: Once | ORAL | 1 refills | Status: AC

## 2017-11-22 NOTE — Progress Notes (Addendum)
S: The patient is here with pain and irritation in her vagina. She has been treated recently and is still taking antibiotics for a UTI by another provider. She was concerned that she had a yeast infection. Last night she tried single dose OTC yeast treatment and she feels a little better today. She describes a feeling of vaginal pain and fullness. She is wondering if her IUD may be contributing to the symptoms. She is also wondering how she got a UTI since she has an abdominal intestinal end point. Discussion of vaginitis, bacterial infections, preventive strategies for UTI and vaginitis.   O: Vital Signs: BP 116/76 (BP Location: Right Arm, Patient Position: Sitting, Cuff Size: Large)   Pulse 80   Ht 5' 7.5" (1.715 m)   Wt (!) 342 lb (155.1 kg)   BMI 52.77 kg/m  Constitutional: Morbidly obese female in no acute distress.  HEENT: normal Skin: Warm and dry.  Cardiovascular: Regular rate and rhythm.   Respiratory: Clear to auscultation bilateral. Normal respiratory effort Psych: Alert and Oriented x3. No memory deficits. Normal mood and affect.    Pelvic exam:  is limited by body habitus EGBUS: within normal limits Vagina: redness/irritation present especially with speculum exam, thin white/yellow discharge present Cervix: IUD strings visualized at approximately 2 cm  Wet prep: +clue cells, negative yeast or whiff  A: 51 yo female with bacterial vaginosis  P: Rx for metronidazole 7 days Rx for diflucan if yeast symptoms develop Probiotics to prevent BV, baking soda or apple cider vinegar for bladder pH to prevent UTI  Rod Can, CNM

## 2017-11-30 ENCOUNTER — Other Ambulatory Visit: Payer: Self-pay | Admitting: Maternal Newborn

## 2017-11-30 ENCOUNTER — Telehealth: Payer: Self-pay

## 2017-11-30 DIAGNOSIS — B3731 Acute candidiasis of vulva and vagina: Secondary | ICD-10-CM

## 2017-11-30 DIAGNOSIS — B373 Candidiasis of vulva and vagina: Secondary | ICD-10-CM

## 2017-11-30 MED ORDER — FLUCONAZOLE 150 MG PO TABS: 150.0000 mg | ORAL_TABLET | Freq: Once | ORAL | 0 refills | Status: DC

## 2017-11-30 NOTE — Progress Notes (Signed)
Patient seen on 11/22/17 with possible yeast infection and told to call for Rx if symptoms develop. Patient now symptomatic and requesting medication. Diflucan prescription sent to pharmacy.  Avel Sensor, CNM 11/30/2017  4:51 PM

## 2017-11-30 NOTE — Telephone Encounter (Signed)
Pt was seen by JEG for BV, was given an antibx for it and was already on antibx for UTI, also given pill for yeast inf and told it probably would not be enough, to call and she would send in another one.  Pt would like for this to be done today. Uses [facility] [address] [phone]

## 2017-12-02 ENCOUNTER — Other Ambulatory Visit: Payer: Self-pay | Admitting: Internal Medicine

## 2017-12-08 ENCOUNTER — Ambulatory Visit: Payer: 59 | Admitting: Obstetrics and Gynecology

## 2018-02-03 ENCOUNTER — Other Ambulatory Visit: Payer: Self-pay

## 2018-02-03 MED ORDER — OMEPRAZOLE 40 MG PO CPDR: 40.0000 mg | DELAYED_RELEASE_CAPSULE | Freq: Every day | ORAL | 3 refills | Status: DC

## 2018-02-03 MED ORDER — PANTOPRAZOLE SODIUM 40 MG PO TBEC: 40.0000 mg | DELAYED_RELEASE_TABLET | Freq: Two times a day (BID) | ORAL | 3 refills | Status: DC

## 2018-03-28 ENCOUNTER — Ambulatory Visit: Payer: Self-pay | Admitting: Nurse Practitioner

## 2018-04-06 ENCOUNTER — Ambulatory Visit: Payer: Self-pay | Admitting: Nurse Practitioner

## 2018-04-21 DIAGNOSIS — J449 Chronic obstructive pulmonary disease, unspecified: Secondary | ICD-10-CM | POA: Diagnosis not present

## 2018-04-27 ENCOUNTER — Other Ambulatory Visit: Payer: Self-pay | Admitting: Nurse Practitioner

## 2018-04-27 MED ORDER — PANTOPRAZOLE SODIUM 40 MG PO TBEC: 40.0000 mg | DELAYED_RELEASE_TABLET | Freq: Two times a day (BID) | ORAL | 3 refills | Status: DC

## 2018-05-15 ENCOUNTER — Other Ambulatory Visit: Payer: Self-pay | Admitting: Internal Medicine

## 2018-05-16 ENCOUNTER — Other Ambulatory Visit: Payer: Self-pay

## 2018-05-17 ENCOUNTER — Other Ambulatory Visit: Payer: Self-pay | Admitting: Nurse Practitioner

## 2018-05-17 MED ORDER — VITAMIN D (ERGOCALCIFEROL) 1.25 MG (50000 UNIT) PO CAPS: ORAL_CAPSULE | ORAL | 0 refills | Status: DC

## 2018-05-18 ENCOUNTER — Ambulatory Visit: Payer: 59 | Admitting: Nurse Practitioner

## 2018-05-18 ENCOUNTER — Ambulatory Visit (INDEPENDENT_AMBULATORY_CARE_PROVIDER_SITE_OTHER): Payer: 59 | Admitting: Obstetrics and Gynecology

## 2018-05-18 ENCOUNTER — Encounter: Payer: Self-pay | Admitting: Nurse Practitioner

## 2018-05-18 ENCOUNTER — Encounter: Payer: Self-pay | Admitting: Obstetrics and Gynecology

## 2018-05-18 VITALS — BP 118/80 | Ht 67.5 in

## 2018-05-18 VITALS — BP 130/74 | HR 67 | Resp 16 | Ht 67.0 in

## 2018-05-18 DIAGNOSIS — R0602 Shortness of breath: Secondary | ICD-10-CM

## 2018-05-18 DIAGNOSIS — Z124 Encounter for screening for malignant neoplasm of cervix: Secondary | ICD-10-CM | POA: Diagnosis not present

## 2018-05-18 DIAGNOSIS — M25561 Pain in right knee: Secondary | ICD-10-CM

## 2018-05-18 DIAGNOSIS — G4733 Obstructive sleep apnea (adult) (pediatric): Secondary | ICD-10-CM

## 2018-05-18 DIAGNOSIS — R2689 Other abnormalities of gait and mobility: Secondary | ICD-10-CM | POA: Diagnosis not present

## 2018-05-18 DIAGNOSIS — I10 Essential (primary) hypertension: Secondary | ICD-10-CM | POA: Diagnosis not present

## 2018-05-18 DIAGNOSIS — Z01419 Encounter for gynecological examination (general) (routine) without abnormal findings: Secondary | ICD-10-CM

## 2018-05-18 DIAGNOSIS — N939 Abnormal uterine and vaginal bleeding, unspecified: Secondary | ICD-10-CM | POA: Diagnosis not present

## 2018-05-18 DIAGNOSIS — E038 Other specified hypothyroidism: Secondary | ICD-10-CM | POA: Diagnosis not present

## 2018-05-18 DIAGNOSIS — Z1239 Encounter for other screening for malignant neoplasm of breast: Secondary | ICD-10-CM

## 2018-05-18 DIAGNOSIS — L03032 Cellulitis of left toe: Secondary | ICD-10-CM

## 2018-05-18 DIAGNOSIS — M25562 Pain in left knee: Secondary | ICD-10-CM

## 2018-05-18 DIAGNOSIS — N8501 Benign endometrial hyperplasia: Secondary | ICD-10-CM

## 2018-05-18 DIAGNOSIS — Z1231 Encounter for screening mammogram for malignant neoplasm of breast: Secondary | ICD-10-CM

## 2018-05-18 DIAGNOSIS — Z6841 Body Mass Index (BMI) 40.0 and over, adult: Secondary | ICD-10-CM

## 2018-05-18 DIAGNOSIS — E063 Autoimmune thyroiditis: Secondary | ICD-10-CM | POA: Diagnosis not present

## 2018-05-18 DIAGNOSIS — Z30431 Encounter for routine checking of intrauterine contraceptive device: Secondary | ICD-10-CM

## 2018-05-18 MED ORDER — SULFAMETHOXAZOLE-TRIMETHOPRIM 800-160 MG PO TABS: 1.0000 | ORAL_TABLET | Freq: Two times a day (BID) | ORAL | 0 refills | Status: DC

## 2018-05-18 MED ORDER — MUPIROCIN 2 % EX OINT: TOPICAL_OINTMENT | CUTANEOUS | 1 refills | Status: DC

## 2018-05-18 NOTE — Patient Instructions (Signed)
Mammogram A mammogram is an X-ray of the breasts that is done to check for abnormal changes. This procedure can screen for and detect any changes that may suggest breast cancer. A mammogram can also identify other changes and variations in the breast, such as:  Inflammation of the breast tissue (mastitis).  An infected area that contains a collection of pus (abscess).  A fluid-filled sac (cyst).  Fibrocystic changes. This is when breast tissue becomes denser, which can make the tissue feel rope-like or uneven under the skin.  Tumors that are not cancerous (benign).  Tell a health care provider about:  Any allergies you have.  If you have breast implants.  If you have had previous breast disease, biopsy, or surgery.  If you are breastfeeding.  Any possibility that you could be pregnant, if this applies.  If you are younger than age 25.  If you have a family history of breast cancer. What are the risks? Generally, this is a safe procedure. However, problems may occur, including:  Exposure to radiation. Radiation levels are very low with this test.  The results being misinterpreted.  The need for further tests.  The inability of the mammogram to detect certain cancers.  What happens before the procedure?  Schedule your test about 1-2 weeks after your menstrual period. This is usually when your breasts are the least tender.  If you have had a mammogram done at a different facility in the past, get the mammogram X-rays or have them sent to your current exam facility in order to compare them.  Wash your breasts and under your arms the day of the test.  Do not wear deodorants, perfumes, lotions, or powders anywhere on your body on the day of the test.  Remove any jewelry from your neck.  Wear clothes that you can change into and out of easily. What happens during the procedure?  You will undress from the waist up and put on a gown.  You will stand in front of the  X-ray machine.  Each breast will be placed between two plastic or glass plates. The plates will compress your breast for a few seconds. Try to stay as relaxed as possible during the procedure. This does not cause any harm to your breasts and any discomfort you feel will be very brief.  X-rays will be taken from different angles of each breast. The procedure may vary among health care providers and hospitals. What happens after the procedure?  The mammogram will be examined by a specialist (radiologist).  You may need to repeat certain parts of the test, depending on the quality of the images. This is commonly done if the radiologist needs a better view of the breast tissue.  Ask when your test results will be ready. Make sure you get your test results.  You may resume your normal activities. This information is not intended to replace advice given to you by your health care provider. Make sure you discuss any questions you have with your health care provider. Document Released: 11/26/2000 Document Revised: 05/03/2016 Document Reviewed: 02/07/2015 Elsevier Interactive Patient Education  2018 [employer]  

## 2018-05-18 NOTE — Progress Notes (Signed)
Gynecology Annual Exam  PCP: Lavera Guise, MD  Chief Complaint:  Chief Complaint  Patient presents with  . Gynecologic Exam  . Vaginal Pain    History of Present Illness: Patient is a 52 y.o. [name] presents for annual exam. The patient has no complaints today.   LMP: No LMP recorded. (Menstrual status: IUD). Patient is status post prior D&C in 2016 showing hyperplasia without atypia.  IUD placed for long term management.  Has not been seen for follow up.  She reports that in the last few months she has experienced increased and prolonged episodes of bleeding despite the IUD.  She has not been re-biopsied since her initial D&C  There is no notable family history of breast or ovarian cancer in her family.  The patient wears seatbelts: yes.   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    The patient denies current symptoms of depression.    Review of Systems: ROS  Past Medical History:  Past Medical History:  Diagnosis Date  . Berger's disease 2011  . Family history of adverse reaction to anesthesia    sister and daughter difficult to wake up  . Folic acid deficiency   . GERD (gastroesophageal reflux disease)   . Hypertension   . Hypothyroidism   . IDA (iron deficiency anemia)   . Thrombocytosis ([place])     Past Surgical History:  Past Surgical History:  Procedure Laterality Date  . ADENOIDECTOMY     Small child  . APPENDECTOMY     Age 73  . COLON SURGERY  1980   ileostomy and internal pouch age 84  . DILATATION & CURETTAGE/HYSTEROSCOPY WITH MYOSURE N/A 05/02/2015   Procedure: DILATATION & CURETTAGE/HYSTEROSCOPY WITH MYOSURE;  Surgeon: Malachy Mood, MD;  Location: [facility] ORS;  Service: Gynecology;  Laterality: N/A;  . LAPAROSCOPIC GASTRIC SLEEVE RESECTION  07/06/2016    Gynecologic History:  No LMP recorded. (Menstrual status: IUD). Contraception: 07/Joanne/2016 IUD  Obstetric History: G2P2002  Family History:  Family History    Problem Relation Age of Onset  . Hypertension Mother   . Congestive Heart Failure Father   . Hypertension Father   . Congestive Heart Failure Maternal Grandmother   . Colon cancer Maternal Grandfather 85  . Skin cancer Maternal Aunt        Basal cell  . Skin cancer Maternal Uncle        Basal cell  . Skin cancer Cousin        Basal Cell    Social History:  Social History   Socioeconomic History  . Marital status: Married    Spouse name: Not on file  . Number of children: Not on file  . Years of education: Not on file  . Highest education level: Not on file  Occupational History  . Not on file  Social Needs  . Financial resource strain: Not on file  . Food insecurity:    Worry: Not on file    Inability: Not on file  . Transportation needs:    Medical: Not on file    Non-medical: Not on file  Tobacco Use  . Smoking status: Former Smoker    Types: Cigarettes    Last attempt to quit: 04/28/2010    Years since quitting: 8.0  . Smokeless tobacco: Never Used  Substance and Sexual Activity  . Alcohol use: No    Alcohol/week: 0.0 oz  . Drug use: No  . Sexual activity: Yes  Birth control/protection: None  Lifestyle  . Physical activity:    Days per week: Not on file    Minutes per session: Not on file  . Stress: Not on file  Relationships  . Social connections:    Talks on phone: Not on file    Gets together: Not on file    Attends religious service: Not on file    Active member of club or organization: Not on file    Attends meetings of clubs or organizations: Not on file    Relationship status: Not on file  . Intimate partner violence:    Fear of current or ex partner: Not on file    Emotionally abused: Not on file    Physically abused: Not on file    Forced sexual activity: Not on file  Other Topics Concern  . Not on file  Social History Narrative  . Not on file    Allergies:  Allergies  Allergen Reactions  . Tape Rash    Some tapes cause a rash,  possible the paper tape.    Medications: Prior to Admission medications   Medication Sig Start Date End Date Taking? Authorizing Provider  amLODipine (NORVASC) 10 MG tablet Take 10 mg by mouth at bedtime.    [provider]  aspirin 81 MG tablet Take 81 mg by mouth at bedtime.    [provider]  ciprofloxacin (CIPRO) 500 MG tablet TAKE 1 TABLET BY MOUTH TWICE A DAY FOR 10 DAYS FOR UTI 11/17/17   [provider]  folic acid (FOLVITE) 1 MG tablet TAKE 1 TABLET BY MOUTH EVERY DAY 12/02/17   Ronnell Freshwater, NP  levonorgestrel (MIRENA, 52 MG,) 20 MCG/24HR IUD by Intrauterine route.    [provider]  levothyroxine (SYNTHROID, LEVOTHROID) 50 MCG tablet TAKE 1 TABLET BY MOUTH EVERY DAY. SHOULD BE TAKEN WITH 200MCG TABLET FOR TOTAL OF 250MCG. 6/3/Joanne   Ronnell Freshwater, NP  Levothyroxine Sodium (LEVOXYL PO) Take 225 mcg by mouth every morning.    [provider]  Multiple Vitamins-Minerals (VITAMIN D3 COMPLETE PO) Take 1 capsule by mouth 2 (two) times a week.    [provider]  mupirocin ointment (BACTROBAN) 2 % Apply to affected area bid for 7 to 10 days 6/6/Joanne   Ronnell Freshwater, NP  omeprazole (PRILOSEC) 40 MG capsule Take 1 capsule (40 mg total) by mouth at bedtime. 2/22/Joanne   Ronnell Freshwater, NP  pantoprazole (PROTONIX) 40 MG tablet Take 1 tablet (40 mg total) by mouth 2 (two) times daily. 5/16/Joanne   Ronnell Freshwater, NP  phenazopyridine (PYRIDIUM) 200 MG tablet TAKE 1 TABLET BY MOUTH 3 TIMES A DAY AS NEEDED PAIN/SPASMS 11/02/17   [provider]  sulfamethoxazole-trimethoprim (BACTRIM DS,SEPTRA DS) 800-160 MG tablet Take 1 tablet by mouth 2 (two) times daily. 6/6/Joanne   Ronnell Freshwater, NP  Vitamin D, Ergocalciferol, (DRISDOL) 50000 units CAPS capsule TAKE 1 CAPSULE ONCE A WEEK 6/5/Joanne   Ronnell Freshwater, NP    Physical Exam Vitals: Blood pressure 118/80, height 5' 7.5" (1.715 m).  General: NAD HEENT: normocephalic,  anicteric Thyroid: no enlargement, no palpable nodules Pulmonary: No increased work of breathing, CTAB Cardiovascular: RRR, distal pulses 2+ Abdomen: NABS, soft, non-tender, non-distended.  Umbilicus without lesions.  No hepatomegaly, splenomegaly or masses palpable. No evidence of hernia  Genitourinary:  External: Normal external female genitalia.  Normal urethral meatus, normal Bartholin's and Skene's glands.    Vagina: Normal vaginal mucosa, no evidence  of prolapse.    Cervix: Grossly normal in appearance, no bleeding, cervical strings visualized  Uterus: Non-enlarged, mobile, normal contour.  No CMT  Adnexa: ovaries non-enlarged, no adnexal masses  Rectal: deferred  Lymphatic: no evidence of inguinal lymphadenopathy Extremities: no edema, erythema, or tenderness Neurologic: Grossly intact Psychiatric: mood appropriate, affect full  Female chaperone present for pelvic  portions of the physical exam    ENDOMETRIAL BIOPSY     The indications for endometrial biopsy were reviewed.   Risks of the biopsy including cramping, bleeding, infection, uterine perforation, inadequate specimen and need for additional procedures  were discussed. The patient states she understands and agrees to undergo procedure today. Consent was signed. Time out was performed. Urine HCG was negative. A Graves speculum was placed and the cervix was brought into view.  The cervix was prepped with Betadine. A single-toothed tenaculum was not placed on the anterior lip of the cervix for traction. A 3 mm pipelle was introduced through the cervix into the endometrial cavity without difficulty to a depth of 8 cm, and a moderate amount of tissue was obtained, the resulting specime sent to pathology. The instruments were removed from the patient's vagina. Minimal bleeding from the cervix was noted. The patient tolerated the procedure well. Routine post-procedure instructions were given to the patient.  She will be contacted by  phone one results become available.     Assessment: 52 y.o. G2P2002 routine annual exam  Plan: Problem List Items Addressed This Visit    None    Visit Diagnoses    Breast screening    -  Primary   Relevant Orders   MM DIGITAL SCREENING BILATERAL   Screening for malignant neoplasm of cervix       Relevant Orders   PapIG, HPV, rfx 16/18   Encounter for gynecological examination without abnormal finding       Relevant Orders   PapIG, HPV, rfx 16/18   Endometrial hyperplasia without atypia, simple       Relevant Orders   US Transvaginal Non-OB   Pathology Report   Abnormal uterine bleeding       Relevant Orders   US Transvaginal Non-OB   Pathology Report   IUD check up       Relevant Orders   US Transvaginal Non-OB      1) Mammogram - recommend yearly screening mammogram.  Mammogram Was ordered today   2) STI screening  was notoffered and therefore not obtained  3) ASCCP guidelines and rational discussed.  Patient opts for every 3 years screening interval  4) Contraception - the patient is currently using  IUD.  She is happy with her current form of contraception and plans to continue  - Given bleeding concern for misplacement of IUD will obtain TVUS to evalute  5) Colonoscopy -- N/A s/p prior colectomy  6) Routine healthcare maintenance including cholesterol, diabetes screening discussed managed by PCP  7) Return in about 1 week (around 05/25/2018) for 1-4 week TVUS and follow up.   Malachy Mood, MD, [facility], [facility] 05/18/2018, 3:48 PM

## 2018-05-18 NOTE — Progress Notes (Signed)
[facility] [place], [place] [address]  Internal MEDICINE  Office Visit Note  Patient Name: [name]  [id number]  [id number]  Date of Service: 06/07/2018   Pt is here for routine follow up.   Chief Complaint  Patient presents with  . Follow-up    left great toe lateral side pus     The patient states that she clipped her toenails the other day, feels like she may have clipped some of the skin around the nailbed. Getting tender and red. Has noted some purulent drainage.  She is also reporting increased shortness of breath, especially with exertion. Even the slightest activity increases ger shortness of breath.       Current Medication: Outpatient Encounter Medications as of 05/18/2018  Medication Sig  . amLODipine (NORVASC) 10 MG tablet Take 10 mg by mouth at bedtime.  Marland Kitchen aspirin 81 MG tablet Take 81 mg by mouth at bedtime.  . folic acid (FOLVITE) 1 MG tablet TAKE 1 TABLET BY MOUTH EVERY DAY  . levonorgestrel (MIRENA, 52 MG,) 20 MCG/24HR IUD by Intrauterine route.  Marland Kitchen levothyroxine (SYNTHROID, LEVOTHROID) 50 MCG tablet TAKE 1 TABLET BY MOUTH EVERY DAY. SHOULD BE TAKEN WITH 200MCG TABLET FOR TOTAL OF 250MCG.  . Levothyroxine Sodium (LEVOXYL PO) Take 225 mcg by mouth every morning.  . Multiple Vitamins-Minerals (VITAMIN D3 COMPLETE PO) Take 1 capsule by mouth 2 (two) times a week.  . mupirocin ointment (BACTROBAN) 2 % Apply to affected area bid for 7 to 10 days  . omeprazole (PRILOSEC) 40 MG capsule Take 1 capsule (40 mg total) by mouth at bedtime.  . pantoprazole (PROTONIX) 40 MG tablet Take 1 tablet (40 mg total) by mouth 2 (two) times daily.  Marland Kitchen sulfamethoxazole-trimethoprim (BACTRIM DS,SEPTRA DS) 800-160 MG tablet Take 1 tablet by mouth 2 (two) times daily.  . Vitamin D, Ergocalciferol, (DRISDOL) 50000 units CAPS capsule TAKE 1 CAPSULE ONCE A WEEK  . [DISCONTINUED] ciprofloxacin (CIPRO) 500 MG tablet TAKE 1 TABLET BY MOUTH TWICE A DAY FOR 10 DAYS FOR  UTI  . [DISCONTINUED] phenazopyridine (PYRIDIUM) 200 MG tablet TAKE 1 TABLET BY MOUTH 3 TIMES A DAY AS NEEDED PAIN/SPASMS   No facility-administered encounter medications on file as of 05/18/2018.     Surgical History: Past Surgical History:  Procedure Laterality Date  . ADENOIDECTOMY     Small child  . APPENDECTOMY     Age 64  . COLON SURGERY  1980   ileostomy and internal pouch age 46  . DILATATION & CURETTAGE/HYSTEROSCOPY WITH MYOSURE N/A 05/02/2015   Procedure: DILATATION & CURETTAGE/HYSTEROSCOPY WITH MYOSURE;  Surgeon: Malachy Mood, MD;  Location: [facility] ORS;  Service: Gynecology;  Laterality: N/A;  . LAPAROSCOPIC GASTRIC SLEEVE RESECTION  07/06/2016    Medical History: Past Medical History:  Diagnosis Date  . Berger's disease 2011  . Family history of adverse reaction to anesthesia    sister and daughter difficult to wake up  . Folic acid deficiency   . GERD (gastroesophageal reflux disease)   . Hypertension   . Hypothyroidism   . IDA (iron deficiency anemia)   . Thrombocytosis ([place])     Family History: Family History  Problem Relation Age of Onset  . Hypertension Mother   . Congestive Heart Failure Father   . Hypertension Father   . Congestive Heart Failure Maternal Grandmother   . Colon cancer Maternal Grandfather 40  . Skin cancer Maternal Aunt        Basal cell  .  Skin cancer Maternal Uncle        Basal cell  . Skin cancer Cousin        Basal Cell    Social History   Socioeconomic History  . Marital status: Married    Spouse name: Not on file  . Number of children: Not on file  . Years of education: Not on file  . Highest education level: Not on file  Occupational History  . Not on file  Social Needs  . Financial resource strain: Not on file  . Food insecurity:    Worry: Not on file    Inability: Not on file  . Transportation needs:    Medical: Not on file    Non-medical: Not on file  Tobacco Use  . Smoking status: Former Smoker    Types:  Cigarettes    Last attempt to quit: 04/28/2010    Years since quitting: 8.1  . Smokeless tobacco: Never Used  Substance and Sexual Activity  . Alcohol use: No    Alcohol/week: 0.0 oz  . Drug use: No  . Sexual activity: Yes    Birth control/protection: None  Lifestyle  . Physical activity:    Days per week: Not on file    Minutes per session: Not on file  . Stress: Not on file  Relationships  . Social connections:    Talks on phone: Not on file    Gets together: Not on file    Attends religious service: Not on file    Active member of club or organization: Not on file    Attends meetings of clubs or organizations: Not on file    Relationship status: Not on file  . Intimate partner violence:    Fear of current or ex partner: Not on file    Emotionally abused: Not on file    Physically abused: Not on file    Forced sexual activity: Not on file  Other Topics Concern  . Not on file  Social History Narrative  . Not on file      Review of Systems  Constitutional: Positive for activity change and fatigue. Negative for chills and unexpected weight change.  HENT: Positive for postnasal drip. Negative for congestion, rhinorrhea, sneezing and sore throat.   Eyes: Negative for redness.  Respiratory: Negative for cough, chest tightness and shortness of breath.   Cardiovascular: Negative for chest pain and palpitations.  Gastrointestinal: Negative for abdominal pain, constipation, diarrhea, nausea and vomiting.  Genitourinary: Negative for dysuria and frequency.  Musculoskeletal: Negative for arthralgias, back pain, joint swelling and neck pain.  Skin: Negative for rash.  Neurological: Negative.  Negative for tremors and numbness.  Hematological: Negative for adenopathy. Does not bruise/bleed easily.  Psychiatric/Behavioral: Negative for behavioral problems (Depression), sleep disturbance and suicidal ideas. The patient is not nervous/anxious.     Today's Vitals   05/18/18 1158   BP: 130/74  Pulse: 67  Resp: 16  SpO2: 96%  Height: 5\' 7"  (1.702 m)    Physical Exam  Constitutional: She is oriented to person, place, and time. She appears well-developed and well-nourished. No distress.  HENT:  Head: Normocephalic and atraumatic.  Mouth/Throat: Oropharynx is clear and moist. No oropharyngeal exudate.  Eyes: Pupils are equal, round, and reactive to light. Conjunctivae and EOM are normal.  Neck: Normal range of motion. Neck supple. No JVD present. No tracheal deviation present. No thyromegaly present.  Cardiovascular: Normal rate, regular rhythm and normal heart sounds. Exam reveals no gallop and no  friction rub.  No murmur heard. Pulmonary/Chest: Effort normal and breath sounds normal. No respiratory distress. She has no wheezes. She has no rales. She exhibits no tenderness.  Abdominal: Soft. Bowel sounds are normal. There is no tenderness.  Musculoskeletal: Normal range of motion.  Patient needing to use wheelchair due to severe bilateral knee pain. Very painful to put weight on the knees and to walk.   Lymphadenopathy:    She has no cervical adenopathy.  Neurological: She is alert and oriented to person, place, and time. No cranial nerve deficit.  Skin: Skin is warm and dry. She is not diaphoretic.  Nailbed along the great toenail of left great toe is red, warm, and tender. There is samll amount of serosanguinous drainage noted.   Psychiatric: She has a normal mood and affect. Her behavior is normal. Judgment and thought content normal.  Nursing note and vitals reviewed.  Assessment/Plan: 1. Cellulitis of toe of left foot Start bactrim DS bid for 10 days. Add Bactroban ointment BID for 10 days. Recommend epsom salt soaks for 20 minutes, one to two times daily.  - sulfamethoxazole-trimethoprim (BACTRIM DS,SEPTRA DS) 800-160 MG tablet; Take 1 tablet by mouth 2 (two) times daily.  Dispense: 20 tablet; Refill: 0 - mupirocin ointment (BACTROBAN) 2 %; Apply to  affected area bid for 7 to 10 days  Dispense: 22 g; Refill: 1  2. Shortness of breath Worsening, especially with exertion. Will get echocardiogram and chemical stress test for further evaluation  - ECHOCARDIOGRAM COMPLETE; Future - NM Myocar Multi W/Spect W/Wall Motion / EF; Future  3. Hypothyroidism due to Hashimoto's thyroiditis Will check thyroid panel and adjust levothyroxine dosing as indicated.   4. Essential hypertension Stable. Continue BP medication as prescribed.   5. Obstructive sleep apnea syndrome Use CPAP/O2 as prescribed  6. Morbid obesity with BMI of 60.0-69.9, adult ([place]) S/p weight loss surgery.   7. Arthralgia of both knees Will order elevated toilet seat for the patient, as standing up from seated position causes severe pain in both knees. Currently having to use a wheelchair to help with mobility.   General Counseling: [name] understanding of the findings of todays visit and agrees with plan of treatment. I have discussed any further diagnostic evaluation that may be needed or ordered today. We also reviewed her medications today. she has been encouraged to call the office with any questions or concerns that should arise related to todays visit.    Counseling:  This patient was seen by Leretha Pol, FNP- C in Collaboration with Dr Lavera Guise as a part of collaborative care agreement   Orders Placed This Encounter  Procedures  . NM Myocar Multi W/Spect W/Wall Motion / EF  . ECHOCARDIOGRAM COMPLETE    Meds ordered this encounter  Medications  . sulfamethoxazole-trimethoprim (BACTRIM DS,SEPTRA DS) 800-160 MG tablet    Sig: Take 1 tablet by mouth 2 (two) times daily.    Dispense:  20 tablet    Refill:  0    Order Specific Question:   Supervising Provider    Answer:   Lavera Guise [[id number]]  . mupirocin ointment (BACTROBAN) 2 %    Sig: Apply to affected area bid for 7 to 10 days    Dispense:  22 g    Refill:  1    Order Specific Question:    Supervising Provider    Answer:   Lavera Guise [[id number]]    Time spent: 41 Minutes    Dr Latricia Heft  [facility]

## 2018-05-22 DIAGNOSIS — J449 Chronic obstructive pulmonary disease, unspecified: Secondary | ICD-10-CM | POA: Diagnosis not present

## 2018-05-22 LAB — PAPIG, HPV, RFX 16/18
HPV, HIGH-RISK: NEGATIVE
PAP SMEAR COMMENT: 0

## 2018-05-23 LAB — PATHOLOGY

## 2018-05-29 ENCOUNTER — Other Ambulatory Visit: Payer: Self-pay | Admitting: Nurse Practitioner

## 2018-05-29 ENCOUNTER — Telehealth: Payer: Self-pay | Admitting: Nurse Practitioner

## 2018-05-29 DIAGNOSIS — M545 Low back pain, unspecified: Secondary | ICD-10-CM

## 2018-05-29 MED ORDER — TIZANIDINE HCL 4 MG PO TABS
ORAL_TABLET | ORAL | 0 refills | Status: DC
Start: 2018-05-29 — End: 2020-03-23

## 2018-05-29 NOTE — Progress Notes (Signed)
Sent in tizanidine 4mg  -take 1/2 to 1 tablet at bedtime as needed for back pain. Recommend OTC tylenol as needed for additional pain relief as well as heat to affected part of the back.

## 2018-05-29 NOTE — Telephone Encounter (Signed)
Sent in tizanidine 4mg  -take 1/2 to 1 tablet at bedtime as needed for back pain. Recommend OTC tylenol as needed for additional pain relief as well as heat to affected part of the back.

## 2018-06-04 ENCOUNTER — Other Ambulatory Visit: Payer: Self-pay | Admitting: Nurse Practitioner

## 2018-06-04 DIAGNOSIS — N390 Urinary tract infection, site not specified: Secondary | ICD-10-CM

## 2018-06-04 DIAGNOSIS — N3289 Other specified disorders of bladder: Secondary | ICD-10-CM

## 2018-06-04 MED ORDER — CIPROFLOXACIN HCL 500 MG PO TABS: 500.0000 mg | ORAL_TABLET | Freq: Two times a day (BID) | ORAL | 2 refills | Status: DC

## 2018-06-04 MED ORDER — PHENAZOPYRIDINE HCL 200 MG PO TABS: 200.0000 mg | ORAL_TABLET | Freq: Three times a day (TID) | ORAL | 2 refills | Status: DC | PRN

## 2018-06-04 NOTE — Progress Notes (Signed)
Patient contacted office over the weekend c/o symptoms associated with UTI. Sent in prescriptions for cipro BID for 10 days and pyridium 200mg  TID as needed for bladder pain. Both sent to [facility]

## 2018-06-07 ENCOUNTER — Telehealth: Payer: Self-pay | Admitting: Nurse Practitioner

## 2018-06-07 DIAGNOSIS — M25562 Pain in left knee: Secondary | ICD-10-CM

## 2018-06-07 DIAGNOSIS — L03032 Cellulitis of left toe: Secondary | ICD-10-CM | POA: Insufficient documentation

## 2018-06-07 DIAGNOSIS — R0602 Shortness of breath: Secondary | ICD-10-CM | POA: Insufficient documentation

## 2018-06-07 DIAGNOSIS — M25561 Pain in right knee: Secondary | ICD-10-CM | POA: Insufficient documentation

## 2018-06-07 DIAGNOSIS — R2689 Other abnormalities of gait and mobility: Secondary | ICD-10-CM | POA: Insufficient documentation

## 2018-06-07 NOTE — Telephone Encounter (Signed)
Spoke to [place] regarding Rx for elevated toilet, will mail to patient , also she states that her bladder is having spasms and does not think the medication is working , pt was advised to give it til Friday and if no improvement per [name] she will give a new antibiotic

## 2018-06-16 ENCOUNTER — Ambulatory Visit: Payer: 59

## 2018-06-16 ENCOUNTER — Telehealth: Payer: Self-pay

## 2018-06-16 ENCOUNTER — Telehealth: Payer: Self-pay | Admitting: Nurse Practitioner

## 2018-06-16 DIAGNOSIS — R0602 Shortness of breath: Secondary | ICD-10-CM | POA: Diagnosis not present

## 2018-06-16 NOTE — Telephone Encounter (Signed)
Ok. I treated her for this at her last visit. Since no improvement, please go ahead and start referral to podiatry. Thanks.

## 2018-06-16 NOTE — Telephone Encounter (Signed)
PT CALLED IN REGARDS TO LABS. PT HAD LABS DRAWN IN [facility] BUT WAS CHARGED A LARGE FEE AND IS UPSET ABOUT THAT. SHE WANTED TO GET LABS DRAWN AT THE OFFICE BUT WE ARE UNABLE TO DRAW HER LABS. SHE WANTED AN ALTERNATIVE SOLUTION TO WHERE SHE WILL NOT BE CHARGED.  I SUGGESTED TO GET A LAB SLIP AND GO TO [facility] BUT DUE TO TRANSPORTATION ISSUES AS WELL AS GETTING A STRESS TEST DONE ON MONDAY SHE RATHER GET IT DONE NEARBY AND NOT ON MONDAY DUE TO THE STRESS TEST.  I WAS UNSURE OF WHAT TO DO SO I LET TITANIA SPEAK TO HER.

## 2018-06-16 NOTE — Telephone Encounter (Signed)
Patient was advised that I will speak with [name] regarding someone draw her labs in the office due to cost at offsite facilities. I told the patient that if she has certain labs that need to be drawn per her insurance that she should bring paperwork to the office so we will know exactly what needs to be ordered per her insurance. This will help with cost (ordering labs not needed) .[name]

## 2018-06-19 ENCOUNTER — Encounter
Admission: RE | Admit: 2018-06-19 | Discharge: 2018-06-19 | Disposition: A | Discharge status: Home / Self Care | Payer: 59 | Source: Ambulatory Visit | Attending: Nurse Practitioner | Admitting: Nurse Practitioner

## 2018-06-19 DIAGNOSIS — R0602 Shortness of breath: Secondary | ICD-10-CM

## 2018-06-19 NOTE — Telephone Encounter (Signed)
PATIENT NEEDS A REFERRAL TO PODIATRY PER HEATHER BOSCIA.

## 2018-06-20 ENCOUNTER — Ambulatory Visit: Payer: 59

## 2018-06-20 ENCOUNTER — Encounter
Admission: RE | Admit: 2018-06-20 | Discharge: 2018-06-20 | Disposition: A | Discharge status: Home / Self Care | Payer: 59 | Source: Ambulatory Visit | Attending: Nurse Practitioner | Admitting: Nurse Practitioner

## 2018-06-20 ENCOUNTER — Other Ambulatory Visit
Admission: RE | Admit: 2018-06-20 | Discharge: 2018-06-20 | Disposition: A | Discharge status: Home / Self Care | Payer: 59 | Source: Ambulatory Visit | Attending: Nurse Practitioner | Admitting: Nurse Practitioner

## 2018-06-20 DIAGNOSIS — R0602 Shortness of breath: Secondary | ICD-10-CM | POA: Diagnosis not present

## 2018-06-20 DIAGNOSIS — E039 Hypothyroidism, unspecified: Secondary | ICD-10-CM | POA: Insufficient documentation

## 2018-06-20 DIAGNOSIS — E559 Vitamin D deficiency, unspecified: Secondary | ICD-10-CM | POA: Insufficient documentation

## 2018-06-20 DIAGNOSIS — E538 Deficiency of other specified B group vitamins: Secondary | ICD-10-CM | POA: Insufficient documentation

## 2018-06-20 DIAGNOSIS — Z9884 Bariatric surgery status: Secondary | ICD-10-CM | POA: Insufficient documentation

## 2018-06-20 LAB — LIPID PANEL
CHOL/HDL RATIO: 3 ratio
CHOLESTEROL: 149 mg/dL (ref 0–200)
HDL: 49 mg/dL (ref >40)
LDL Cholesterol: 84 mg/dL (ref 0–99)
TRIGLYCERIDES: 81 mg/dL (ref <150)
VLDL: 16 mg/dL (ref 0–40)

## 2018-06-20 LAB — CBC
HCT: 42.1 % (ref 35.0–47.0)
Hemoglobin: 14.3 g/dL (ref 12.0–16.0)
MCH: 30.8 pg (ref 26.0–34.0)
MCHC: 34 g/dL (ref 32.0–36.0)
MCV: 90.5 fL (ref 80.0–100.0)
PLATELETS: 332 10*3/uL (ref 150–440)
RBC: 4.65 MIL/uL (ref 3.80–5.20)
RDW: 14.6 % — AB (ref 11.5–14.5)
WBC: 8 10*3/uL (ref 3.6–11.0)

## 2018-06-20 LAB — COMPREHENSIVE METABOLIC PANEL
ALK PHOS: 97 U/L (ref 38–126)
ALT: 21 U/L (ref 0–44)
ANION GAP: 8 (ref 5–15)
AST: 27 U/L (ref 15–41)
Albumin: 3.6 g/dL (ref 3.5–5.0)
BILIRUBIN TOTAL: 1 mg/dL (ref 0.3–1.2)
BUN: 10 mg/dL (ref 6–20)
CALCIUM: 8.8 mg/dL — AB (ref 8.9–10.3)
CO2: 24 mmol/L (ref 22–32)
Chloride: 106 mmol/L (ref 98–111)
Creatinine, Ser: 0.63 mg/dL (ref 0.44–1.00)
GFR calc Af Amer: >60 mL/min (ref >60)
Glucose, Bld: 105 mg/dL — ABNORMAL HIGH (ref 70–99)
POTASSIUM: 3.6 mmol/L (ref 3.5–5.1)
Sodium: 138 mmol/L (ref 135–145)
TOTAL PROTEIN: 7.5 g/dL (ref 6.5–8.1)

## 2018-06-20 LAB — IRON AND TIBC
Iron: 118 ug/dL (ref 28–170)
Saturation Ratios: 34 % — ABNORMAL HIGH (ref 10.4–31.8)
TIBC: 346 ug/dL (ref 250–450)
UIBC: 228 ug/dL

## 2018-06-20 LAB — FOLATE: FOLATE: 26 ng/mL (ref >5.9)

## 2018-06-20 LAB — FERRITIN: FERRITIN: 65 ng/mL (ref 11–307)

## 2018-06-20 LAB — T4, FREE: FREE T4: 2.16 ng/dL — AB (ref 0.82–1.77)

## 2018-06-20 LAB — TSH: TSH: 0.068 u[IU]/mL — ABNORMAL LOW (ref 0.350–4.500)

## 2018-06-20 LAB — VITAMIN B12: VITAMIN B 12: 1471 pg/mL — AB (ref 180–914)

## 2018-06-20 MED ORDER — TECHNETIUM TC 99M TETROFOSMIN IV KIT
30.0000 | PACK | Freq: Once | INTRAVENOUS | Status: AC | PRN
  Administered 2018-06-20: 31.3 via INTRAVENOUS

## 2018-06-21 ENCOUNTER — Encounter
Admission: RE | Admit: 2018-06-21 | Discharge: 2018-06-21 | Disposition: A | Discharge status: Home / Self Care | Payer: 59 | Source: Ambulatory Visit | Attending: Nurse Practitioner | Admitting: Nurse Practitioner

## 2018-06-21 DIAGNOSIS — J449 Chronic obstructive pulmonary disease, unspecified: Secondary | ICD-10-CM | POA: Diagnosis not present

## 2018-06-21 LAB — RHEUMATOID FACTOR: Rhuematoid fact SerPl-aCnc: <10 IU/mL (ref 0.0–13.9)

## 2018-06-21 LAB — NM MYOCAR MULTI W/SPECT W/WALL MOTION / EF
CHL CUP RESTING HR STRESS: 91 {beats}/min
CSEPED: 0 min
CSEPHR: 73 %
Estimated workload: 1 METS
Exercise duration (sec): 0 s
LV dias vol: 74 mL (ref 46–106)
LVSYSVOL: 39 mL
MPHR: 171 {beats}/min
NUC STRESS TID: 0.54
Peak HR: 126 {beats}/min

## 2018-06-21 LAB — THYROGLOBULIN ANTIBODY: Thyroglobulin Antibody: 9.9 IU/mL — ABNORMAL HIGH (ref 0.0–0.9)

## 2018-06-21 LAB — ANA W/REFLEX IF POSITIVE: Anti Nuclear Antibody(ANA): NEGATIVE

## 2018-06-21 LAB — THYROID PEROXIDASE ANTIBODY: Thyroperoxidase Ab SerPl-aCnc: 157 IU/mL — ABNORMAL HIGH (ref 0–34)

## 2018-06-21 MED ORDER — TECHNETIUM TC 99M TETROFOSMIN IV KIT
29.9900 | PACK | Freq: Once | INTRAVENOUS | Status: AC | PRN
  Administered 2018-06-21: 29.99 via INTRAVENOUS

## 2018-06-21 MED ORDER — REGADENOSON 0.4 MG/5ML IV SOLN
0.4000 mg | Freq: Once | INTRAVENOUS | Status: AC
  Administered 2018-06-20: 0.4 mg via INTRAVENOUS

## 2018-06-22 ENCOUNTER — Telehealth: Payer: Self-pay | Admitting: Nurse Practitioner

## 2018-06-22 ENCOUNTER — Other Ambulatory Visit: Payer: Self-pay | Admitting: Nurse Practitioner

## 2018-06-22 DIAGNOSIS — E038 Other specified hypothyroidism: Secondary | ICD-10-CM

## 2018-06-22 DIAGNOSIS — E063 Autoimmune thyroiditis: Principal | ICD-10-CM

## 2018-06-22 MED ORDER — LEVOTHYROXINE SODIUM 25 MCG PO TABS: 25.0000 ug | ORAL_TABLET | Freq: Every day | ORAL | 1 refills | Status: DC

## 2018-06-22 NOTE — Telephone Encounter (Signed)
Pt reduced to 283mcg

## 2018-06-22 NOTE — Progress Notes (Signed)
Labs indicated overactive thyroid. Reduced 22mcg to 68mcg. Continue with additional 262mcg. Recheck thryoid panel in about 3 months.

## 2018-06-22 NOTE — Telephone Encounter (Signed)
Pt called wanting to know the results of her echo, stress test and labs. Patient was told that she was getting a phone call yesterday

## 2018-06-22 NOTE — Telephone Encounter (Signed)
Labs indicated overactive thyroid. Reduced 87mcg to 58mcg. Continue with additional 233mcg. Recheck thryoid panel in about 3 months. All other labs were good. Stress test was normal. I don't have echo results yet.

## 2018-06-27 ENCOUNTER — Other Ambulatory Visit: Payer: Self-pay | Admitting: Nurse Practitioner

## 2018-06-27 MED ORDER — VITAMIN D (ERGOCALCIFEROL) 1.25 MG (50000 UNIT) PO CAPS: ORAL_CAPSULE | ORAL | 2 refills | Status: DC

## 2018-06-28 LAB — VITAMIN D 25 HYDROXY (VIT D DEFICIENCY, FRACTURES): Vit D, 25-Hydroxy: 30.4 ng/mL (ref 30.0–100.0)

## 2018-07-11 ENCOUNTER — Ambulatory Visit: Payer: 59 | Admitting: Podiatry

## 2018-07-11 ENCOUNTER — Other Ambulatory Visit: Payer: Self-pay | Admitting: Internal Medicine

## 2018-07-21 ENCOUNTER — Ambulatory Visit: Payer: 59 | Admitting: Obstetrics and Gynecology

## 2018-07-21 ENCOUNTER — Other Ambulatory Visit: Payer: 59

## 2018-07-22 DIAGNOSIS — J449 Chronic obstructive pulmonary disease, unspecified: Secondary | ICD-10-CM | POA: Diagnosis not present

## 2018-07-24 ENCOUNTER — Ambulatory Visit: Payer: Self-pay | Admitting: Nurse Practitioner

## 2018-07-25 ENCOUNTER — Ambulatory Visit: Payer: 59 | Admitting: Podiatry

## 2018-07-25 ENCOUNTER — Encounter

## 2018-07-26 ENCOUNTER — Ambulatory Visit: Payer: 59 | Admitting: Obstetrics and Gynecology

## 2018-07-26 ENCOUNTER — Other Ambulatory Visit: Payer: 59

## 2018-07-27 ENCOUNTER — Other Ambulatory Visit: Payer: Self-pay

## 2018-07-27 MED ORDER — PANTOPRAZOLE SODIUM 40 MG PO TBEC: 40.0000 mg | DELAYED_RELEASE_TABLET | Freq: Two times a day (BID) | ORAL | 3 refills | Status: DC

## 2018-08-22 DIAGNOSIS — J449 Chronic obstructive pulmonary disease, unspecified: Secondary | ICD-10-CM | POA: Diagnosis not present

## 2018-09-21 DIAGNOSIS — J449 Chronic obstructive pulmonary disease, unspecified: Secondary | ICD-10-CM | POA: Diagnosis not present

## 2018-10-10 ENCOUNTER — Encounter: Payer: Self-pay | Admitting: Nurse Practitioner

## 2018-10-10 ENCOUNTER — Ambulatory Visit (INDEPENDENT_AMBULATORY_CARE_PROVIDER_SITE_OTHER): Payer: 59 | Admitting: Nurse Practitioner

## 2018-10-10 VITALS — BP 128/76 | HR 80 | Resp 16 | Ht 67.5 in | Wt 350.0 lb

## 2018-10-10 DIAGNOSIS — I731 Thromboangiitis obliterans [Buerger's disease]: Secondary | ICD-10-CM

## 2018-10-10 DIAGNOSIS — E538 Deficiency of other specified B group vitamins: Secondary | ICD-10-CM

## 2018-10-10 DIAGNOSIS — I10 Essential (primary) hypertension: Secondary | ICD-10-CM | POA: Diagnosis not present

## 2018-10-10 DIAGNOSIS — E063 Autoimmune thyroiditis: Secondary | ICD-10-CM

## 2018-10-10 DIAGNOSIS — I739 Peripheral vascular disease, unspecified: Secondary | ICD-10-CM | POA: Diagnosis not present

## 2018-10-10 DIAGNOSIS — Z1239 Encounter for other screening for malignant neoplasm of breast: Secondary | ICD-10-CM

## 2018-10-10 DIAGNOSIS — E038 Other specified hypothyroidism: Secondary | ICD-10-CM

## 2018-10-10 DIAGNOSIS — K269 Duodenal ulcer, unspecified as acute or chronic, without hemorrhage or perforation: Secondary | ICD-10-CM

## 2018-10-10 MED ORDER — LEVOTHYROXINE SODIUM 200 MCG PO TABS: 200.0000 ug | ORAL_TABLET | Freq: Every day | ORAL | 3 refills | Status: DC

## 2018-10-10 MED ORDER — FOLIC ACID 1 MG PO TABS: 1.0000 mg | ORAL_TABLET | Freq: Every day | ORAL | 3 refills | Status: DC

## 2018-10-10 MED ORDER — PANTOPRAZOLE SODIUM 40 MG PO TBEC: 40.0000 mg | DELAYED_RELEASE_TABLET | Freq: Two times a day (BID) | ORAL | 3 refills | Status: DC

## 2018-10-10 MED ORDER — AMLODIPINE BESYLATE 10 MG PO TABS: 10.0000 mg | ORAL_TABLET | Freq: Every day | ORAL | 3 refills | Status: DC

## 2018-10-10 NOTE — Progress Notes (Signed)
[facility] [place], [place] [address]  Internal MEDICINE  Office Visit Note  Patient Name: [name]  [id number]  [id number]  Date of Service: 10/11/2018  Chief Complaint  Patient presents with  . Medical Management of Chronic Issues    follow up, insurance is changing and its time for some blood work, Insurance underwriter form annual forms,   . Rash    pt has a red area on right arm that just popped up a little sore has not noticed any other places    The patient states that she has small skin tear on her right lowe forearm. Does have a new puppy. was likely scratched from the small puppy. Tear is healing and does not hurt.  She states that it is getting increasingly difficult for her to leave the home. Her knees and lower legs are so sore and painful all the time. She states that it can take three people to help get her up and mobile so she can leave home for doctor's appointments. 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 She did have abdominal CT which showed evidence of venous insufficiency in her legs.  She has brought paperwork with her from her insurance. They would like routine fasting lab results that are done after 06/12/2018. Her last set of labs was done 06/20/2018 and should be sufficient. Will print and fill out paperwork and fax into insurance.       Current Medication: Outpatient Encounter Medications as of 10/10/2018  Medication Sig  . amLODipine (NORVASC) 10 MG tablet Take 1 tablet (10 mg total) by mouth at bedtime.  Marland Kitchen aspirin 81 MG tablet Take 81 mg by mouth at bedtime.  . folic acid (FOLVITE) 1 MG tablet Take 1 tablet (1 mg total) by mouth daily.  Marland Kitchen levonorgestrel (MIRENA, 52 MG,) 20 MCG/24HR IUD by Intrauterine route.  Marland Kitchen levothyroxine (SYNTHROID, LEVOTHROID) 200 MCG tablet Take 1 tablet (200 mcg total) by mouth daily.  . Multiple Vitamins-Minerals (VITAMIN D3 COMPLETE PO) Take 1  capsule by mouth 2 (two) times a week.  . mupirocin ointment (BACTROBAN) 2 % Apply to affected area bid for 7 to 10 days  . pantoprazole (PROTONIX) 40 MG tablet Take 1 tablet (40 mg total) by mouth 2 (two) times daily.  Marland Kitchen tiZANidine (ZANAFLEX) 4 MG tablet Take 1/2 to 1 tablet po QPM prn muscle pain  . Vitamin D, Ergocalciferol, (DRISDOL) 50000 units CAPS capsule TAKE 1 CAPSULE ONCE A WEEK  . [DISCONTINUED] amLODipine (NORVASC) 10 MG tablet Take 10 mg by mouth at bedtime.  . [DISCONTINUED] folic acid (FOLVITE) 1 MG tablet TAKE 1 TABLET BY MOUTH EVERY DAY  . [DISCONTINUED] levothyroxine (SYNTHROID, LEVOTHROID) 200 MCG tablet TAKE 1 TABLET BY MOUTH EVERY DAY  . [DISCONTINUED] omeprazole (PRILOSEC) 40 MG capsule Take 1 capsule (40 mg total) by mouth at bedtime.  . [DISCONTINUED] pantoprazole (PROTONIX) 40 MG tablet Take 1 tablet (40 mg total) by mouth 2 (two) times daily.  . [DISCONTINUED] phenazopyridine (PYRIDIUM) 200 MG tablet Take 1 tablet (200 mg total) by mouth 3 (three) times daily as needed for pain.  . [DISCONTINUED] sulfamethoxazole-trimethoprim (BACTRIM DS,SEPTRA DS) 800-160 MG tablet Take 1 tablet by mouth 2 (two) times daily.  . [DISCONTINUED] ciprofloxacin (CIPRO) 500 MG tablet Take 1 tablet (500 mg total) by mouth 2 (two) times daily. (Patient not taking: Reported on 10/10/2018)   No facility-administered encounter medications on file as of  10/10/2018.     Surgical History: Past Surgical History:  Procedure Laterality Date  . ADENOIDECTOMY     Small child  . APPENDECTOMY     Age 79  . COLON SURGERY  1980   ileostomy and internal pouch age 84  . DILATATION & CURETTAGE/HYSTEROSCOPY WITH MYOSURE N/A 05/02/2015   Procedure: DILATATION & CURETTAGE/HYSTEROSCOPY WITH MYOSURE;  Surgeon: Malachy Mood, MD;  Location: [facility] ORS;  Service: Gynecology;  Laterality: N/A;  . LAPAROSCOPIC GASTRIC SLEEVE RESECTION  07/06/2016    Medical History: Past Medical History:  Diagnosis Date  .  Berger's disease 2011  . Family history of adverse reaction to anesthesia    sister and daughter difficult to wake up  . Folic acid deficiency   . GERD (gastroesophageal reflux disease)   . Hypertension   . Hypothyroidism   . IDA (iron deficiency anemia)   . Thrombocytosis ([place])     Family History: Family History  Problem Relation Age of Onset  . Hypertension Mother   . Congestive Heart Failure Father   . Hypertension Father   . Congestive Heart Failure Maternal Grandmother   . Colon cancer Maternal Grandfather 53  . Skin cancer Maternal Aunt        Basal cell  . Skin cancer Maternal Uncle        Basal cell  . Skin cancer Cousin        Basal Cell    Social History   Socioeconomic History  . Marital status: Married    Spouse name: Not on file  . Number of children: Not on file  . Years of education: Not on file  . Highest education level: Not on file  Occupational History  . Not on file  Social Needs  . Financial resource strain: Not on file  . Food insecurity:    Worry: Not on file    Inability: Not on file  . Transportation needs:    Medical: Not on file    Non-medical: Not on file  Tobacco Use  . Smoking status: Former Smoker    Types: Cigarettes    Last attempt to quit: 04/28/2010    Years since quitting: 8.4  . Smokeless tobacco: Never Used  Substance and Sexual Activity  . Alcohol use: No    Alcohol/week: 0.0 standard drinks  . Drug use: No  . Sexual activity: Yes    Birth control/protection: None  Lifestyle  . Physical activity:    Days per week: Not on file    Minutes per session: Not on file  . Stress: Not on file  Relationships  . Social connections:    Talks on phone: Not on file    Gets together: Not on file    Attends religious service: Not on file    Active member of club or organization: Not on file    Attends meetings of clubs or organizations: Not on file    Relationship status: Not on file  . Intimate partner violence:    Fear of  current or ex partner: Not on file    Emotionally abused: Not on file    Physically abused: Not on file    Forced sexual activity: Not on file  Other Topics Concern  . Not on file  Social History Narrative  . Not on file      Review of Systems  Constitutional: Positive for activity change and fatigue. Negative for chills and unexpected weight change.  HENT: Negative for congestion, postnasal drip, rhinorrhea, sneezing  and sore throat.   Eyes: Negative.  Negative for redness.  Respiratory: Positive for shortness of breath. Negative for cough and chest tightness.        Mostly with exertion.  Cardiovascular: Negative for chest pain and palpitations.       Shortness of breath with minimal exertion. Leg pain, believed to be circulatory in nature.   Gastrointestinal: Negative for abdominal pain, constipation, diarrhea, nausea and vomiting.  Endocrine: Negative for cold intolerance, heat intolerance, polydipsia, polyphagia and polyuria.       Recent thyroid panel done looked good.   Genitourinary: Negative for dysuria, frequency and menstrual problem.  Musculoskeletal: Positive for arthralgias and myalgias. Negative for back pain, joint swelling and neck pain.  Skin: Negative for rash.       Small skin tear on right forearm.   Allergic/Immunologic: Negative for environmental allergies.  Neurological: Negative for dizziness, tremors, numbness and headaches.  Hematological: Negative for adenopathy. Does not bruise/bleed easily.  Psychiatric/Behavioral: Negative for behavioral problems (Depression), sleep disturbance and suicidal ideas. The patient is not nervous/anxious.     Today's Vitals   10/10/18 1407  BP: 128/76  Pulse: 80  Resp: 16  SpO2: 97%  Weight: (!) 350 lb (158.8 kg)  Height: 5' 7.5" (1.715 m)    Physical Exam  Constitutional: She is oriented to person, place, and time. She appears well-developed and well-nourished. No distress.  Morbidly obese. Currently in  wheelchair.   HENT:  Head: Normocephalic and atraumatic.  Mouth/Throat: Oropharynx is clear and moist. No oropharyngeal exudate.  Eyes: Pupils are equal, round, and reactive to light. EOM are normal.  Neck: Normal range of motion. Neck supple. No JVD present. No tracheal deviation present. No thyromegaly present.  Cardiovascular: Normal rate, regular rhythm and normal heart sounds. Exam reveals no gallop and no friction rub.  No murmur heard. Pulmonary/Chest: Effort normal and breath sounds normal. No respiratory distress. She has no wheezes. She has no rales. She exhibits no tenderness.  Abdominal: Soft. Bowel sounds are normal. There is no tenderness.  Musculoskeletal: Normal range of motion.  Lymphadenopathy:    She has no cervical adenopathy.  Neurological: She is alert and oriented to person, place, and time. No cranial nerve deficit.  Skin: Skin is warm and dry. She is not diaphoretic.  Psychiatric: She has a normal mood and affect. Her behavior is normal. Judgment and thought content normal.  Nursing note and vitals reviewed.  Assessment/Plan: 1. Essential hypertension Well controlled. Continue amlodipine as prescribed  - amLODipine (NORVASC) 10 MG tablet; Take 1 tablet (10 mg total) by mouth at bedtime.  Dispense: 90 tablet; Refill: 3  2. 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) Likely contributing to leg pain. Was recommended to see hematologist when first diagnosed. That provider retired, and she has not seen specialist for this since. Will get referral to new hematologist for further evaluation and treatment.  - Ambulatory referral to Hematology  3. Hypothyroidism due to Hashimoto's thyroiditis Mot recent thyroid panel good. Continue levothyroxine as prescribed.  - levothyroxine (SYNTHROID, LEVOTHROID) 200 MCG tablet; Take 1 tablet (200 mcg total) by mouth daily.  Dispense: 90 tablet; Refill: 3  4. Folic acid deficiency - folic acid (FOLVITE) 1 MG tablet; Take 1 tablet (1 mg total) by mouth  daily.  Dispense: 90 tablet; Refill: 3  5. Duodenal ulcer - pantoprazole (PROTONIX) 40 MG tablet; Take 1 tablet (40 mg total) by mouth 2 (two) times daily.  Dispense: 180 tablet; Refill: 3  6. Screening for breast cancer -  MM DIGITAL SCREENING BILATERAL; Future  General Counseling: [name] understanding of the findings of todays visit and agrees with plan of treatment. I have discussed any further diagnostic evaluation that may be needed or ordered today. We also reviewed her medications today. she has been encouraged to call the office with any questions or concerns that should arise related to todays visit.  Counseling: Adherence of Medical Therapy: The patient understands that it is the responsibility of the patient to complete all prescribed medications, all recommended testing, including but not limited to, laboratory studies and imaging. The patient further understands the need to keep all scheduled follow-up visits and to inform the office immediately of any changes in their medical condition. The patient understands that the success of treatment in large part depends on the patient's willingness to complete the therapeutic regimen and to work in partnership with the designated health-care providers.   This patient was seen by Joanne Pol FNP Collaboration with Dr Lavera Guise as a part of collaborative care agreement  Orders Placed This Encounter  Procedures  . MM DIGITAL SCREENING BILATERAL  . Ambulatory referral to Hematology    Meds ordered this encounter  Medications  . amLODipine (NORVASC) 10 MG tablet    Sig: Take 1 tablet (10 mg total) by mouth at bedtime.    Dispense:  90 tablet    Refill:  3    Order Specific Question:   Supervising Provider    Answer:   Lavera Guise [[id number]]  . levothyroxine (SYNTHROID, LEVOTHROID) 200 MCG tablet    Sig: Take 1 tablet (200 mcg total) by mouth daily.    Dispense:  90 tablet    Refill:  3    Order Specific Question:    Supervising Provider    Answer:   Lavera Guise [[id number]]  . pantoprazole (PROTONIX) 40 MG tablet    Sig: Take 1 tablet (40 mg total) by mouth 2 (two) times daily.    Dispense:  180 tablet    Refill:  3    Order Specific Question:   Supervising Provider    Answer:   Lavera Guise [[id number]]  . folic acid (FOLVITE) 1 MG tablet    Sig: Take 1 tablet (1 mg total) by mouth daily.    Dispense:  90 tablet    Refill:  3    Order Specific Question:   Supervising Provider    Answer:   Lavera Guise [[id number]]    Time spent: 66 Minutes      Dr Lavera Guise Internal medicine

## 2018-10-11 DIAGNOSIS — I739 Peripheral vascular disease, unspecified: Secondary | ICD-10-CM | POA: Insufficient documentation

## 2018-10-11 DIAGNOSIS — Z1239 Encounter for other screening for malignant neoplasm of breast: Secondary | ICD-10-CM | POA: Insufficient documentation

## 2018-10-11 DIAGNOSIS — K269 Duodenal ulcer, unspecified as acute or chronic, without hemorrhage or perforation: Secondary | ICD-10-CM | POA: Insufficient documentation

## 2018-10-11 DIAGNOSIS — E538 Deficiency of other specified B group vitamins: Secondary | ICD-10-CM | POA: Insufficient documentation

## 2018-10-22 DIAGNOSIS — J449 Chronic obstructive pulmonary disease, unspecified: Secondary | ICD-10-CM | POA: Diagnosis not present

## 2018-10-30 ENCOUNTER — Other Ambulatory Visit: Payer: Self-pay

## 2018-10-30 MED ORDER — ASPIRIN 81 MG PO TABS: 81.0000 mg | ORAL_TABLET | Freq: Every day | ORAL | 1 refills | Status: DC

## 2018-10-31 ENCOUNTER — Encounter: Payer: Self-pay | Admitting: Nurse Practitioner

## 2018-11-21 DIAGNOSIS — J449 Chronic obstructive pulmonary disease, unspecified: Secondary | ICD-10-CM | POA: Diagnosis not present

## 2018-12-22 DIAGNOSIS — J449 Chronic obstructive pulmonary disease, unspecified: Secondary | ICD-10-CM | POA: Diagnosis not present

## 2019-01-22 DIAGNOSIS — J449 Chronic obstructive pulmonary disease, unspecified: Secondary | ICD-10-CM | POA: Diagnosis not present

## 2019-02-20 DIAGNOSIS — J449 Chronic obstructive pulmonary disease, unspecified: Secondary | ICD-10-CM | POA: Diagnosis not present

## 2019-03-07 ENCOUNTER — Other Ambulatory Visit: Payer: Self-pay

## 2019-03-07 MED ORDER — VITAMIN D (ERGOCALCIFEROL) 1.25 MG (50000 UNIT) PO CAPS: ORAL_CAPSULE | ORAL | 2 refills | Status: DC

## 2019-03-23 DIAGNOSIS — J449 Chronic obstructive pulmonary disease, unspecified: Secondary | ICD-10-CM | POA: Diagnosis not present

## 2019-04-22 DIAGNOSIS — J449 Chronic obstructive pulmonary disease, unspecified: Secondary | ICD-10-CM | POA: Diagnosis not present

## 2019-05-23 ENCOUNTER — Other Ambulatory Visit: Payer: Self-pay

## 2019-05-23 MED ORDER — ASPIRIN EC 81 MG PO TBEC: 81.0000 mg | DELAYED_RELEASE_TABLET | Freq: Every day | ORAL | 0 refills | Status: DC

## 2019-06-01 ENCOUNTER — Encounter: Payer: Self-pay | Admitting: Nurse Practitioner

## 2019-06-01 ENCOUNTER — Other Ambulatory Visit: Payer: Self-pay

## 2019-06-01 ENCOUNTER — Ambulatory Visit (INDEPENDENT_AMBULATORY_CARE_PROVIDER_SITE_OTHER): Payer: 59 | Admitting: Nurse Practitioner

## 2019-06-01 VITALS — Ht 67.5 in | Wt 350.0 lb

## 2019-06-01 DIAGNOSIS — E038 Other specified hypothyroidism: Secondary | ICD-10-CM

## 2019-06-01 DIAGNOSIS — E559 Vitamin D deficiency, unspecified: Secondary | ICD-10-CM | POA: Diagnosis not present

## 2019-06-01 DIAGNOSIS — K269 Duodenal ulcer, unspecified as acute or chronic, without hemorrhage or perforation: Secondary | ICD-10-CM

## 2019-06-01 DIAGNOSIS — I10 Essential (primary) hypertension: Secondary | ICD-10-CM | POA: Diagnosis not present

## 2019-06-01 DIAGNOSIS — E063 Autoimmune thyroiditis: Secondary | ICD-10-CM

## 2019-06-01 MED ORDER — LEVOTHYROXINE SODIUM 200 MCG PO TABS: 200.0000 ug | ORAL_TABLET | Freq: Every day | ORAL | 3 refills | Status: DC

## 2019-06-01 MED ORDER — AMLODIPINE BESYLATE 10 MG PO TABS: 10.0000 mg | ORAL_TABLET | Freq: Every day | ORAL | 3 refills | Status: DC

## 2019-06-01 MED ORDER — VITAMIN D (ERGOCALCIFEROL) 1.25 MG (50000 UNIT) PO CAPS: ORAL_CAPSULE | ORAL | 3 refills | Status: DC

## 2019-06-01 MED ORDER — PANTOPRAZOLE SODIUM 40 MG PO TBEC: 40.0000 mg | DELAYED_RELEASE_TABLET | Freq: Two times a day (BID) | ORAL | 3 refills | Status: DC

## 2019-06-01 NOTE — Progress Notes (Signed)
[facility] [place], [place] [address]  Internal MEDICINE  Telephone Visit  Patient Name: [name]  [id number]  [id number]  Date of Service: 06/06/2019  I connected with the patient at 4:13pm by webcam and verified the patients identity using two identifiers.   I discussed the limitations, risks, security and privacy concerns of performing an evaluation and management service by webcam and the availability of in person appointments. I also discussed with the patient that there may be a patient responsible charge related to the service.  The patient expressed understanding and agrees to proceed.    Chief Complaint  Patient presents with  . Telephone Screen    VIDEO VISIT [phone]  . Telephone Assessment  . Medical Management of Chronic Issues    medication refills  . Anemia  . Gastroesophageal Reflux  . Hypertension    The patient has been contacted via webcam for follow up visit due to concerns for spread of novel coronavirus. She states that it is getting increasingly difficult for her to leave the home. Her knees and lower legs are so sore and painful all the time. She states that it can take three people to help get her up and mobile so she can leave home for doctor's appointments. 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 She states that she needs to have several of her routine medications filled today.       Current Medication: Outpatient Encounter Medications as of 06/01/2019  Medication Sig  . aspirin EC 81 MG tablet Take 1 tablet (81 mg total) by mouth daily.  . folic acid (FOLVITE) 1 MG tablet Take 1 tablet (1 mg total) by mouth daily.  Marland Kitchen levonorgestrel (MIRENA, 52 MG,) 20 MCG/24HR IUD by Intrauterine route.  . Multiple Vitamins-Minerals (VITAMIN D3 COMPLETE PO) Take 1 capsule by mouth 2 (two) times a week.  . mupirocin ointment (BACTROBAN) 2 % Apply to affected area bid  for 7 to 10 days  . tiZANidine (ZANAFLEX) 4 MG tablet Take 1/2 to 1 tablet po QPM prn muscle pain  . [DISCONTINUED] amLODipine (NORVASC) 10 MG tablet Take 1 tablet (10 mg total) by mouth at bedtime.  . [DISCONTINUED] aspirin 81 MG tablet Take 1 tablet (81 mg total) by mouth at bedtime.  . [DISCONTINUED] levothyroxine (SYNTHROID, LEVOTHROID) 200 MCG tablet Take 1 tablet (200 mcg total) by mouth daily.  . [DISCONTINUED] pantoprazole (PROTONIX) 40 MG tablet Take 1 tablet (40 mg total) by mouth 2 (two) times daily.  . [DISCONTINUED] Vitamin D, Ergocalciferol, (DRISDOL) 1.25 MG (50000 UT) CAPS capsule TAKE 1 CAPSULE ONCE A WEEK  . amLODipine (NORVASC) 10 MG tablet Take 1 tablet (10 mg total) by mouth at bedtime.  Marland Kitchen levothyroxine (SYNTHROID) 200 MCG tablet Take 1 tablet (200 mcg total) by mouth daily.  . pantoprazole (PROTONIX) 40 MG tablet Take 1 tablet (40 mg total) by mouth 2 (two) times daily.  . Vitamin D, Ergocalciferol, (DRISDOL) 1.25 MG (50000 UT) CAPS capsule TAKE 1 CAPSULE ONCE A WEEK   No facility-administered encounter medications on file as of 06/01/2019.     Surgical History: Past Surgical History:  Procedure Laterality Date  . ADENOIDECTOMY     Small child  . APPENDECTOMY     Age 38  . COLON SURGERY  1980   ileostomy and internal pouch age 10  . DILATATION & CURETTAGE/HYSTEROSCOPY WITH MYOSURE N/A 05/02/2015   Procedure: DILATATION & CURETTAGE/HYSTEROSCOPY WITH  MYOSURE;  Surgeon: Malachy Mood, MD;  Location: [facility] ORS;  Service: Gynecology;  Laterality: N/A;  . LAPAROSCOPIC GASTRIC SLEEVE RESECTION  07/06/2016    Medical History: Past Medical History:  Diagnosis Date  . Berger's disease 2011  . Family history of adverse reaction to anesthesia    sister and daughter difficult to wake up  . Folic acid deficiency   . GERD (gastroesophageal reflux disease)   . Hypertension   . Hypothyroidism   . IDA (iron deficiency anemia)   . Thrombocytosis ([place])     Family  History: Family History  Problem Relation Age of Onset  . Hypertension Mother   . Congestive Heart Failure Father   . Hypertension Father   . Congestive Heart Failure Maternal Grandmother   . Colon cancer Maternal Grandfather 80  . Skin cancer Maternal Aunt        Basal cell  . Skin cancer Maternal Uncle        Basal cell  . Skin cancer Cousin        Basal Cell    Social History   Socioeconomic History  . Marital status: Married    Spouse name: Not on file  . Number of children: Not on file  . Years of education: Not on file  . Highest education level: Not on file  Occupational History  . Not on file  Social Needs  . Financial resource strain: Not on file  . Food insecurity    Worry: Not on file    Inability: Not on file  . Transportation needs    Medical: Not on file    Non-medical: Not on file  Tobacco Use  . Smoking status: Former Smoker    Types: Cigarettes    Quit date: 04/28/2010    Years since quitting: 9.1  . Smokeless tobacco: Never Used  Substance and Sexual Activity  . Alcohol use: No    Alcohol/week: 0.0 standard drinks  . Drug use: No  . Sexual activity: Yes    Birth control/protection: None  Lifestyle  . Physical activity    Days per week: Not on file    Minutes per session: Not on file  . Stress: Not on file  Relationships  . Social Herbalist on phone: Not on file    Gets together: Not on file    Attends religious service: Not on file    Active member of club or organization: Not on file    Attends meetings of clubs or organizations: Not on file    Relationship status: Not on file  . Intimate partner violence    Fear of current or ex partner: Not on file    Emotionally abused: Not on file    Physically abused: Not on file    Forced sexual activity: Not on file  Other Topics Concern  . Not on file  Social History Narrative  . Not on file      Review of Systems  Constitutional: Positive for fatigue. Negative for activity  change, chills and unexpected weight change.  HENT: Negative for congestion, postnasal drip, rhinorrhea, sneezing and sore throat.   Eyes: Negative.  Negative for redness.  Respiratory: Positive for shortness of breath. Negative for cough and chest tightness.        Mostly with exertion.  Cardiovascular: Negative for chest pain and palpitations.       Shortness of breath with minimal exertion. Leg pain, believed to be circulatory in nature.   Gastrointestinal:  Negative for abdominal pain, constipation, diarrhea, nausea and vomiting.  Endocrine: Negative for cold intolerance, heat intolerance, polydipsia and polyuria.       Recent thyroid panel done looked good.   Genitourinary: Negative for dysuria, frequency and menstrual problem.  Musculoskeletal: Positive for arthralgias and myalgias. Negative for back pain, joint swelling and neck pain.  Skin: Negative for rash.  Allergic/Immunologic: Negative for environmental allergies.  Neurological: Positive for weakness. Negative for dizziness, tremors, numbness and headaches.  Hematological: Negative for adenopathy. Does not bruise/bleed easily.  Psychiatric/Behavioral: Negative for behavioral problems (Depression), sleep disturbance and suicidal ideas. The patient is not nervous/anxious.     Today's Vitals   06/01/19 1541  Weight: (!) 350 lb (158.8 kg)  Height: 5' 7.5" (1.715 m)   Body mass index is 54.01 kg/m.  Observation/Objective:   The patient is alert and oriented. She is pleasant and answers all questions appropriately. Breathing is non-labored. She is in no acute distress at this time.    Assessment/Plan: 1. Essential hypertension Stable. Continue amlodipine 10mg  daily.  - amLODipine (NORVASC) 10 MG tablet; Take 1 tablet (10 mg total) by mouth at bedtime.  Dispense: 90 tablet; Refill: 3  2. Hypothyroidism due to Hashimoto's thyroiditis Continue levothyroxine at current dose. Will check thyroid panel and adjust dosing as  indicated.  - levothyroxine (SYNTHROID) 200 MCG tablet; Take 1 tablet (200 mcg total) by mouth daily.  Dispense: 90 tablet; Refill: 3  3. Duodenal ulcer Continue protonix 40mg  daily. - pantoprazole (PROTONIX) 40 MG tablet; Take 1 tablet (40 mg total) by mouth 2 (two) times daily.  Dispense: 180 tablet; Refill: 3  4. Vitamin D deficiency - Vitamin D, Ergocalciferol, (DRISDOL) 1.25 MG (50000 UT) CAPS capsule; TAKE 1 CAPSULE ONCE A WEEK  Dispense: 12 capsule; Refill: 3  General Counseling: [place] verbalizes understanding of the findings of today's phone visit and agrees with plan of treatment. I have discussed any further diagnostic evaluation that may be needed or ordered today. We also reviewed her medications today. she has been encouraged to call the office with any questions or concerns that should arise related to todays visit.  Hypertension Counseling:   The following hypertensive lifestyle modification were recommended and discussed:  1. Limiting alcohol intake to less than 1 oz/day of ethanol:(24 oz of beer or 8 oz of wine or 2 oz of 100-proof whiskey). 2. Take baby ASA 81 mg daily. 3. Importance of regular aerobic exercise and losing weight. 4. Reduce dietary saturated fat and cholesterol intake for overall cardiovascular health. 5. Maintaining adequate dietary potassium, calcium, and magnesium intake. 6. Regular monitoring of the blood pressure. 7. Reduce sodium intake to less than 100 mmol/day (less than 2.3 gm of sodium or less than 6 gm of sodium choride)   This patient was seen by [place] with Dr Lavera Guise as a part of collaborative care agreement  Meds ordered this encounter  Medications  . amLODipine (NORVASC) 10 MG tablet    Sig: Take 1 tablet (10 mg total) by mouth at bedtime.    Dispense:  90 tablet    Refill:  3    Order Specific Question:   Supervising Provider    Answer:   Lavera Guise [[id number]]  . levothyroxine (SYNTHROID) 200 MCG tablet     Sig: Take 1 tablet (200 mcg total) by mouth daily.    Dispense:  90 tablet    Refill:  3    Order Specific Question:   Supervising  Provider    Answer:   Lavera Guise [[id number]]  . pantoprazole (PROTONIX) 40 MG tablet    Sig: Take 1 tablet (40 mg total) by mouth 2 (two) times daily.    Dispense:  180 tablet    Refill:  3    Order Specific Question:   Supervising Provider    Answer:   Lavera Guise [[id number]]  . Vitamin D, Ergocalciferol, (DRISDOL) 1.25 MG (50000 UT) CAPS capsule    Sig: TAKE 1 CAPSULE ONCE A WEEK    Dispense:  12 capsule    Refill:  3    Order Specific Question:   Supervising Provider    Answer:   Lavera Guise [[id number]]    Time spent: 53 Minutes    Dr Lavera Guise Internal medicine

## 2019-06-06 DIAGNOSIS — E559 Vitamin D deficiency, unspecified: Secondary | ICD-10-CM | POA: Insufficient documentation

## 2019-07-10 ENCOUNTER — Telehealth: Payer: Self-pay

## 2019-07-10 NOTE — Telephone Encounter (Signed)
Pt calling; is filling out paperwork for disability and needs the following information:  The first time she saw AMS, the last time she saw AMS, the name of IUD; date IUD was inserted; and the reason it was inserted.  [phone]  Adv pt first saw AMS 03/05/15; last saw AMS 05/18/18, name of IUD is Mirena, the reason for IUD is endometrial hyperplasia s atypia, IUD was put in 07/01/15.

## 2019-08-21 ENCOUNTER — Other Ambulatory Visit: Payer: Self-pay

## 2019-08-21 MED ORDER — ASPIRIN EC 81 MG PO TBEC: 81.0000 mg | DELAYED_RELEASE_TABLET | Freq: Every day | ORAL | 2 refills | Status: DC

## 2019-09-07 ENCOUNTER — Ambulatory Visit: Payer: 59 | Admitting: Nurse Practitioner

## 2019-10-16 ENCOUNTER — Other Ambulatory Visit
Admission: RE | Admit: 2019-10-16 | Discharge: 2019-10-16 | Disposition: A | Discharge status: Home / Self Care | Payer: 59 | Source: Ambulatory Visit | Attending: Internal Medicine | Admitting: Internal Medicine

## 2019-10-16 ENCOUNTER — Ambulatory Visit: Payer: 59 | Admitting: Internal Medicine

## 2019-10-16 ENCOUNTER — Other Ambulatory Visit: Payer: Self-pay

## 2019-10-16 ENCOUNTER — Encounter: Payer: Self-pay | Admitting: Internal Medicine

## 2019-10-16 VITALS — Ht 67.0 in | Wt 350.0 lb

## 2019-10-16 DIAGNOSIS — N390 Urinary tract infection, site not specified: Secondary | ICD-10-CM

## 2019-10-16 DIAGNOSIS — E038 Other specified hypothyroidism: Secondary | ICD-10-CM

## 2019-10-16 DIAGNOSIS — I739 Peripheral vascular disease, unspecified: Secondary | ICD-10-CM

## 2019-10-16 DIAGNOSIS — E063 Autoimmune thyroiditis: Secondary | ICD-10-CM | POA: Insufficient documentation

## 2019-10-16 DIAGNOSIS — I10 Essential (primary) hypertension: Secondary | ICD-10-CM | POA: Insufficient documentation

## 2019-10-16 DIAGNOSIS — Z20828 Contact with and (suspected) exposure to other viral communicable diseases: Secondary | ICD-10-CM

## 2019-10-16 DIAGNOSIS — Z20822 Contact with and (suspected) exposure to covid-19: Secondary | ICD-10-CM

## 2019-10-16 DIAGNOSIS — Z1231 Encounter for screening mammogram for malignant neoplasm of breast: Secondary | ICD-10-CM

## 2019-10-16 LAB — TSH: TSH: 1.802 u[IU]/mL (ref 0.350–4.500)

## 2019-10-16 LAB — URINALYSIS, ROUTINE W REFLEX MICROSCOPIC
Bacteria, UA: NONE SEEN
Bilirubin Urine: NEGATIVE
Glucose, UA: NEGATIVE mg/dL
Hgb urine dipstick: NEGATIVE
Ketones, ur: NEGATIVE mg/dL
Nitrite: NEGATIVE
Protein, ur: NEGATIVE mg/dL
Specific Gravity, Urine: 1.013 (ref 1.005–1.030)
pH: 5 (ref 5.0–8.0)

## 2019-10-16 LAB — COMPREHENSIVE METABOLIC PANEL
ALT: 48 U/L — ABNORMAL HIGH (ref 0–44)
AST: 41 U/L (ref 15–41)
Albumin: 3.6 g/dL (ref 3.5–5.0)
Alkaline Phosphatase: 93 U/L (ref 38–126)
Anion gap: 14 (ref 5–15)
BUN: 9 mg/dL (ref 6–20)
CO2: 22 mmol/L (ref 22–32)
Calcium: 9.2 mg/dL (ref 8.9–10.3)
Chloride: 102 mmol/L (ref 98–111)
Creatinine, Ser: 0.7 mg/dL (ref 0.44–1.00)
GFR calc Af Amer: >60 mL/min (ref >60)
GFR calc non Af Amer: >60 mL/min (ref >60)
Glucose, Bld: 98 mg/dL (ref 70–99)
Potassium: 4 mmol/L (ref 3.5–5.1)
Sodium: 138 mmol/L (ref 135–145)
Total Bilirubin: 0.9 mg/dL (ref 0.3–1.2)
Total Protein: 7.4 g/dL (ref 6.5–8.1)

## 2019-10-16 LAB — CBC WITH DIFFERENTIAL/PLATELET
Abs Immature Granulocytes: 0.04 10*3/uL (ref 0.00–0.07)
Basophils Absolute: 0 10*3/uL (ref 0.0–0.1)
Basophils Relative: 0 %
Eosinophils Absolute: 0.1 10*3/uL (ref 0.0–0.5)
Eosinophils Relative: 1 %
HCT: 44.4 % (ref 36.0–46.0)
Hemoglobin: 14.5 g/dL (ref 12.0–15.0)
Immature Granulocytes: 0 %
Lymphocytes Relative: 24 %
Lymphs Abs: 2.4 10*3/uL (ref 0.7–4.0)
MCH: 29.2 pg (ref 26.0–34.0)
MCHC: 32.7 g/dL (ref 30.0–36.0)
MCV: 89.5 fL (ref 80.0–100.0)
Monocytes Absolute: 0.8 10*3/uL (ref 0.1–1.0)
Monocytes Relative: 8 %
Neutro Abs: 6.6 10*3/uL (ref 1.7–7.7)
Neutrophils Relative %: 67 %
Platelets: 372 10*3/uL (ref 150–400)
RBC: 4.96 MIL/uL (ref 3.87–5.11)
RDW: 14.6 % (ref 11.5–15.5)
WBC: 9.9 10*3/uL (ref 4.0–10.5)
nRBC: 0 % (ref 0.0–0.2)

## 2019-10-16 LAB — IRON AND TIBC
Iron: 67 ug/dL (ref 28–170)
Saturation Ratios: 22 % (ref 10.4–31.8)
TIBC: 329 ug/dL (ref 250–450)
UIBC: 258 ug/dL

## 2019-10-16 LAB — T4, FREE: Free T4: 1.61 ng/dL — ABNORMAL HIGH (ref 0.61–1.12)

## 2019-10-16 LAB — LIPID PANEL
Cholesterol: 135 mg/dL (ref 0–200)
HDL: 39 mg/dL — ABNORMAL LOW (ref >40)
LDL Cholesterol: 74 mg/dL (ref 0–99)
Total CHOL/HDL Ratio: 3.5 RATIO
Triglycerides: 110 mg/dL (ref <150)
VLDL: 22 mg/dL (ref 0–40)

## 2019-10-16 LAB — FERRITIN: Ferritin: 62 ng/mL (ref 11–307)

## 2019-10-16 LAB — VITAMIN B12: Vitamin B-12: 263 pg/mL (ref 180–914)

## 2019-10-16 LAB — FOLATE: Folate: 35 ng/mL (ref >5.9)

## 2019-10-16 NOTE — Progress Notes (Signed)
[facility] [place], [place] [id number]  Internal MEDICINE  Telephone Visit  Patient Name: [name]  [id number]  CI:[id number]  Date of Service: 10/16/2019  I connected with the patient at 1013 by Video and verified the patients identity using two identifiers.   I discussed the limitations, risks, security and privacy concerns of performing an evaluation and management service by telephone and the availability of in person appointments. I also discussed with the patient that there may be a patient responsible charge related to the service.  The patient expressed understanding and agrees to proceed.    Chief Complaint  Patient presents with  . Telephone Assessment  . Telephone Screen  . Hypertension  . Urinary Tract Infection  . Medical Management of Chronic Issues    pt need labs done by [facility]     HPI Pt is connected for follow up. Requesting labs, has family history of antiphospholipid ab and will like to be tested for it, daughter has recent covid 46, was exposed to her, she will like to be tested for ab. 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 amLODipine (NORVASC) 10 MG tablet Take 1 tablet (10 mg total) by mouth at bedtime.  Marland Kitchen aspirin EC 81 MG tablet Take 1 tablet (81 mg total) by mouth daily.  . folic acid (FOLVITE) 1 MG tablet Take 1 tablet (1 mg total) by mouth daily.  Marland Kitchen levonorgestrel (MIRENA, 52 MG,) 20 MCG/24HR IUD by Intrauterine route.  Marland Kitchen levothyroxine (SYNTHROID) 200 MCG tablet Take 1 tablet (200 mcg total) by mouth daily.  . Multiple Vitamins-Minerals (VITAMIN D3 COMPLETE PO) Take 1 capsule by mouth 2 (two) times a week.  . mupirocin ointment (BACTROBAN) 2 % Apply to affected area bid for 7 to 10 days  . pantoprazole (PROTONIX) 40 MG tablet Take 1 tablet (40 mg total) by mouth 2  (two) times daily.  Marland Kitchen tiZANidine (ZANAFLEX) 4 MG tablet Take 1/2 to 1 tablet po QPM prn muscle pain  . Vitamin D, Ergocalciferol, (DRISDOL) 1.25 MG (50000 UT) CAPS capsule TAKE 1 CAPSULE ONCE A WEEK   No facility-administered encounter medications on file as of 10/16/2019.     Surgical History: Past Surgical History:  Procedure Laterality Date  . ADENOIDECTOMY     Small child  . APPENDECTOMY     Age 27  . COLON SURGERY  1980   ileostomy and internal pouch age 3  . DILATATION & CURETTAGE/HYSTEROSCOPY WITH MYOSURE N/A 05/02/2015   Procedure: DILATATION & CURETTAGE/HYSTEROSCOPY WITH MYOSURE;  Surgeon: Malachy Mood, MD;  Location: [facility] ORS;  Service: Gynecology;  Laterality: N/A;  . LAPAROSCOPIC GASTRIC SLEEVE RESECTION  07/06/2016    Medical History: Past Medical History:  Diagnosis Date  . Berger's disease 2011  . Family history of adverse reaction to anesthesia    sister and daughter difficult to wake up  . Folic acid deficiency   . GERD (gastroesophageal reflux disease)   . Hypertension   . Hypothyroidism   . IDA (iron deficiency anemia)   . Thrombocytosis ([place])     Family History: Family History  Problem Relation Age of Onset  . Hypertension Mother   . Congestive Heart Failure Father   . Hypertension Father   . Congestive Heart Failure Maternal Grandmother   . Colon cancer Maternal Grandfather 3  .  Skin cancer Maternal Aunt        Basal cell  . Skin cancer Maternal Uncle        Basal cell  . Skin cancer Cousin        Basal Cell    Social History   Socioeconomic History  . Marital status: Married    Spouse name: Not on file  . Number of children: Not on file  . Years of education: Not on file  . Highest education level: Not on file  Occupational History  . Not on file  Social Needs  . Financial resource strain: Not on file  . Food insecurity    Worry: Not on file    Inability: Not on file  . Transportation needs    Medical: Not on file     Non-medical: Not on file  Tobacco Use  . Smoking status: Former Smoker    Types: Cigarettes    Quit date: 04/28/2010    Years since quitting: 9.4  . Smokeless tobacco: Never Used  Substance and Sexual Activity  . Alcohol use: No    Alcohol/week: 0.0 standard drinks  . Drug use: No  . Sexual activity: Yes    Birth control/protection: None  Lifestyle  . Physical activity    Days per week: Not on file    Minutes per session: Not on file  . Stress: Not on file  Relationships  . Social Herbalist on phone: Not on file    Gets together: Not on file    Attends religious service: Not on file    Active member of club or organization: Not on file    Attends meetings of clubs or organizations: Not on file    Relationship status: Not on file  . Intimate partner violence    Fear of current or ex partner: Not on file    Emotionally abused: Not on file    Physically abused: Not on file    Forced sexual activity: Not on file  Other Topics Concern  . Not on file  Social History Narrative  . Not on file   Review of Systems  Vital Signs: Ht 5\' 7"  (1.702 m)   Wt (!) 350 lb (158.8 kg)   BMI 54.82 kg/m    Observation/Objective: Pt is pleasant to talk to  NAD  Assessment/Plan: 1. Hypothyroidism due to Hashimoto's thyroiditis - Continue Synthroid as before -- TSH - T4, free  2. Essential hypertension - Continue all meds   3. Urinary tract infection without hematuria, site unspecified - Check U/A   4. Peripheral vascular disease ([place]) - Per Vascular// cardiolipin ab   5. Encounter for screening mammogram for malignant neoplasm of breast  - mammogram is orderdd   6. Exposure to COVID-19 virus - SARS-CoV-2 Antibody, IgM  General Counseling: Shaira verbalizes understanding of the findings of today's phone visit and agrees with plan of treatment. I have discussed any further diagnostic evaluation that may be needed or ordered today. We also reviewed her medications  today. she has been encouraged to call the office with any questions or concerns that should arise related to todays visit.   Orders Placed This Encounter  Procedures  . Cardiolipin antibodies, IgM+IgG  . CBC with Differential/Platelet  . Lipid Panel With LDL/HDL Ratio  . TSH  . T4, free  . Comprehensive metabolic panel  . B12 and Folate Panel  . Fe+TIBC+Fer  . Urinalysis  . SARS-CoV-2 Antibody, IgM     Time  spent: 102 Minutes    Dr Lavera Guise Internal medicine

## 2019-10-17 ENCOUNTER — Ambulatory Visit: Payer: 59 | Admitting: Nurse Practitioner

## 2019-10-18 LAB — CARDIOLIPIN ANTIBODIES, IGM+IGG
Anticardiolipin IgG: <9 GPL U/mL (ref 0–14)
Anticardiolipin IgM: <9 MPL U/mL (ref 0–12)

## 2019-10-23 ENCOUNTER — Other Ambulatory Visit: Payer: Self-pay

## 2019-10-23 ENCOUNTER — Telehealth: Payer: Self-pay | Admitting: Internal Medicine

## 2019-10-23 ENCOUNTER — Ambulatory Visit (INDEPENDENT_AMBULATORY_CARE_PROVIDER_SITE_OTHER): Payer: 59 | Admitting: Internal Medicine

## 2019-10-23 DIAGNOSIS — E038 Other specified hypothyroidism: Secondary | ICD-10-CM

## 2019-10-23 DIAGNOSIS — E538 Deficiency of other specified B group vitamins: Secondary | ICD-10-CM

## 2019-10-23 DIAGNOSIS — E063 Autoimmune thyroiditis: Secondary | ICD-10-CM

## 2019-10-23 DIAGNOSIS — K269 Duodenal ulcer, unspecified as acute or chronic, without hemorrhage or perforation: Secondary | ICD-10-CM

## 2019-10-23 DIAGNOSIS — I10 Essential (primary) hypertension: Secondary | ICD-10-CM | POA: Diagnosis not present

## 2019-10-23 MED ORDER — LEVOTHYROXINE SODIUM 200 MCG PO TABS: 200.0000 ug | ORAL_TABLET | Freq: Every day | ORAL | 3 refills | Status: DC

## 2019-10-23 MED ORDER — AMLODIPINE BESYLATE 10 MG PO TABS: 10.0000 mg | ORAL_TABLET | Freq: Every day | ORAL | 3 refills | Status: DC

## 2019-10-23 MED ORDER — FOLIC ACID 1 MG PO TABS: 1.0000 mg | ORAL_TABLET | Freq: Every day | ORAL | 3 refills | Status: DC

## 2019-10-23 MED ORDER — PANTOPRAZOLE SODIUM 40 MG PO TBEC: 40.0000 mg | DELAYED_RELEASE_TABLET | Freq: Two times a day (BID) | ORAL | 3 refills | Status: DC

## 2019-10-23 NOTE — Progress Notes (Signed)
[facility] [place], [place] [address]  Internal MEDICINE  Telephone Visit  Patient Name: [name]  [id number]  CI:[id number]  Date of Service: 10/23/2019  I connected with the patient at 1215 by telephone and verified the patients identity using two identifiers.   I discussed the limitations, risks, security and privacy concerns of performing an evaluation and management service by telephone and the availability of in person appointments. I also discussed with the patient that there may be a patient responsible charge related to the service.  The patient expressed understanding and agrees to proceed.    Chief Complaint  Patient presents with  . Telephone Assessment  . Telephone Screen  . Hypertension  . Gastroesophageal Reflux  . Follow-up    labs and paperwork    HPI Pt is seen for Labs follow up. All abnormal labs are discussed with her, mildly elevated free T4 and borderline low B12. Denies any other complaints. Pt has h/o Buerger's disease however I am unable to find any documentation in her medical record about mode of diagnosis. Pt also needs paper work for her insurance. Pt has Mirena which might not be indicated due to Buerger's disease. Bp is under good control  Current Medication: Outpatient Encounter Medications as of 10/23/2019  Medication Sig  . amLODipine (NORVASC) 10 MG tablet Take 1 tablet (10 mg total) by mouth at bedtime.  Marland Kitchen aspirin EC 81 MG tablet Take 1 tablet (81 mg total) by mouth daily.  . folic acid (FOLVITE) 1 MG tablet Take 1 tablet (1 mg total) by mouth daily.  Marland Kitchen levonorgestrel (MIRENA, 52 MG,) 20 MCG/24HR IUD by Intrauterine route.  Marland Kitchen levothyroxine (SYNTHROID) 200 MCG tablet Take 1 tablet (200 mcg total) by mouth daily.  . Multiple Vitamins-Minerals (VITAMIN D3 COMPLETE PO) Take 1 capsule by mouth 2 (two) times a week.  . mupirocin ointment (BACTROBAN) 2 % Apply to affected area bid for 7 to 10 days  . pantoprazole  (PROTONIX) 40 MG tablet Take 1 tablet (40 mg total) by mouth 2 (two) times daily.  Marland Kitchen tiZANidine (ZANAFLEX) 4 MG tablet Take 1/2 to 1 tablet po QPM prn muscle pain  . Vitamin D, Ergocalciferol, (DRISDOL) 1.25 MG (50000 UT) CAPS capsule TAKE 1 CAPSULE ONCE A WEEK   No facility-administered encounter medications on file as of 10/23/2019.     Surgical History: Past Surgical History:  Procedure Laterality Date  . ADENOIDECTOMY     Small child  . APPENDECTOMY     Age 81  . COLON SURGERY  1980   ileostomy and internal pouch age 7  . DILATATION & CURETTAGE/HYSTEROSCOPY WITH MYOSURE N/A 05/02/2015   Procedure: DILATATION & CURETTAGE/HYSTEROSCOPY WITH MYOSURE;  Surgeon: Malachy Mood, MD;  Location: [facility] ORS;  Service: Gynecology;  Laterality: N/A;  . LAPAROSCOPIC GASTRIC SLEEVE RESECTION  07/06/2016    Medical History: Past Medical History:  Diagnosis Date  . Berger's disease 2011  . Family history of adverse reaction to anesthesia    sister and daughter difficult to wake up  . Folic acid deficiency   . GERD (gastroesophageal reflux disease)   . Hypertension   . Hypothyroidism   . IDA (iron deficiency anemia)   . Thrombocytosis ([place])     Family History: Family History  Problem Relation Age of Onset  . Hypertension Mother   . Congestive Heart Failure Father   . Hypertension Father   . Congestive Heart Failure Maternal Grandmother   . Colon cancer Maternal Grandfather  85  . Skin cancer Maternal Aunt        Basal cell  . Skin cancer Maternal Uncle        Basal cell  . Skin cancer Cousin        Basal Cell    Social History   Socioeconomic History  . Marital status: Married    Spouse name: Not on file  . Number of children: Not on file  . Years of education: Not on file  . Highest education level: Not on file  Occupational History  . Not on file  Social Needs  . Financial resource strain: Not on file  . Food insecurity    Worry: Not on file    Inability: Not on  file  . Transportation needs    Medical: Not on file    Non-medical: Not on file  Tobacco Use  . Smoking status: Former Smoker    Types: Cigarettes    Quit date: 04/28/2010    Years since quitting: 9.4  . Smokeless tobacco: Never Used  Substance and Sexual Activity  . Alcohol use: No    Alcohol/week: 0.0 standard drinks  . Drug use: No  . Sexual activity: Yes    Birth control/protection: None  Lifestyle  . Physical activity    Days per week: Not on file    Minutes per session: Not on file  . Stress: Not on file  Relationships  . Social Herbalist on phone: Not on file    Gets together: Not on file    Attends religious service: Not on file    Active member of club or organization: Not on file    Attends meetings of clubs or organizations: Not on file    Relationship status: Not on file  . Intimate partner violence    Fear of current or ex partner: Not on file    Emotionally abused: Not on file    Physically abused: Not on file    Forced sexual activity: Not on file  Other Topics Concern  . Not on file  Social History Narrative  . Not on file    Review of Systems  Constitutional: Positive for activity change.  HENT: Negative.   Respiratory: Negative.  Negative for apnea.   Cardiovascular: Negative.   Endocrine: Negative.   Musculoskeletal: Positive for arthralgias. Negative for joint swelling.  Neurological: Negative.     Vital Signs: Ht 5\' 7"  (1.702 m)   Wt (!) 350 lb (158.8 kg)   BMI 54.82 kg/m    Observation/Objective: NAD, staying home   Assessment/Plan: 1. Essential hypertension - Continue Norvasc as before  2. Hypothyroidism due to Hashimoto's thyroiditis - Pt has mildly elevated Free T4, she will continue Synthroid 200 mg for 5 days and for the rest of 2 days she will take half tab. This way pt will not waste her Synthroid    3. B12 deficiency due to diet - Pt is instructed to take OTC b12, she does have h/o gastric sleeve   General  Counseling: jacquelinne brumlow understanding of the findings of today's phone visit and agrees with plan of treatment. I have discussed any further diagnostic evaluation that may be needed or ordered today. We also reviewed her medications today. she has been encouraged to call the office with any questions or concerns that should arise related to todays visit.  Time spent:15 Minutes Dr Lavera Guise Internal medicine

## 2019-10-23 NOTE — Telephone Encounter (Signed)
Pt informed of form faxed to insurance , also made sure patient understood how to take synthroid medication , 5 days a week a whole pill on and two days a week , Wednesday and saturdays will take half of pill .

## 2019-10-30 ENCOUNTER — Other Ambulatory Visit: Payer: Self-pay | Admitting: Nurse Practitioner

## 2019-10-30 ENCOUNTER — Telehealth: Payer: Self-pay | Admitting: Nurse Practitioner

## 2019-10-30 DIAGNOSIS — B373 Candidiasis of vulva and vagina: Secondary | ICD-10-CM

## 2019-10-30 DIAGNOSIS — N390 Urinary tract infection, site not specified: Secondary | ICD-10-CM

## 2019-10-30 DIAGNOSIS — B3731 Acute candidiasis of vulva and vagina: Secondary | ICD-10-CM

## 2019-10-30 DIAGNOSIS — N3289 Other specified disorders of bladder: Secondary | ICD-10-CM

## 2019-10-30 MED ORDER — FLUCONAZOLE 150 MG PO TABS: ORAL_TABLET | ORAL | 2 refills | Status: DC

## 2019-10-30 MED ORDER — CIPROFLOXACIN HCL 500 MG PO TABS: ORAL_TABLET | ORAL | 2 refills | Status: DC

## 2019-10-30 MED ORDER — PHENAZOPYRIDINE HCL 200 MG PO TABS: 200.0000 mg | ORAL_TABLET | Freq: Three times a day (TID) | ORAL | 2 refills | Status: DC | PRN

## 2019-10-30 NOTE — Progress Notes (Signed)
I sent prescriptions for cipro 500mg  bid for 10 days and pyridium 200mg  three times daily if needed for bladder pain and spasms to [facility] [address]. Also added diflucan to take as needed if yeast infection develops.

## 2019-10-30 NOTE — Telephone Encounter (Signed)
I sent prescriptions for cipro 500mg  bid for 10 days and pyridium 200mg  three times daily if needed for bladder pain and spasms to [facility] [address]. Also added diflucan to take as needed if yeast infection develops.

## 2019-10-30 NOTE — Telephone Encounter (Signed)
Pt has been advised medications are at pharmacy. [name]

## 2019-10-31 ENCOUNTER — Telehealth: Payer: Self-pay | Admitting: Obstetrics and Gynecology

## 2019-10-31 ENCOUNTER — Other Ambulatory Visit: Payer: Self-pay | Admitting: Obstetrics and Gynecology

## 2019-10-31 MED ORDER — METRONIDAZOLE 500 MG PO TABS: 500.0000 mg | ORAL_TABLET | Freq: Two times a day (BID) | ORAL | 0 refills | Status: AC

## 2019-10-31 NOTE — Telephone Encounter (Signed)
advise

## 2019-10-31 NOTE — Telephone Encounter (Signed)
Patient is calling with BV  Symptoms. Patient states she would like to know if she could have [facility] sent [facility] to [facility] [address]. Patient is schedule for annual 11/20/19 with [facility]. No current opening with Dr. Georgianne Fick until the 2nd week of December. Please advise

## 2019-10-31 NOTE — Telephone Encounter (Signed)
Rx is in if fails to improve will need to be seen

## 2019-11-01 NOTE — Telephone Encounter (Signed)
Pt is scheduled for AE/Mirena removal/Replacement 11/20/2019 @9 :30 in Procedure Room. Noted and will order Mirena to arrive by apt date/time.

## 2019-11-01 NOTE — Telephone Encounter (Signed)
Pt aware of Rx being called in. She needs to have her IUD switched out and would like to do that same day. She has requested the procedure room d/t being disabled.   Crystal can you reserve Mirena for pt on 12/8 and reserve procedure room. thanks

## 2019-11-05 ENCOUNTER — Other Ambulatory Visit: Payer: Self-pay | Admitting: Nurse Practitioner

## 2019-11-05 ENCOUNTER — Telehealth: Payer: Self-pay

## 2019-11-05 DIAGNOSIS — K219 Gastro-esophageal reflux disease without esophagitis: Secondary | ICD-10-CM

## 2019-11-05 MED ORDER — FAMOTIDINE 20 MG PO TABS: 20.0000 mg | ORAL_TABLET | Freq: Two times a day (BID) | ORAL | 3 refills | Status: DC

## 2019-11-05 NOTE — Telephone Encounter (Signed)
Added famotidine 20mg  twice daily to help with reflux symptoms. Sent new prescription to [facility] [address].

## 2019-11-05 NOTE — Progress Notes (Signed)
Added famotidine 20mg  twice daily to help with reflux symptoms. Sent new prescription to [facility] [address].

## 2019-11-05 NOTE — Telephone Encounter (Signed)
Pt advised we add famotidine and send to phar

## 2019-11-05 NOTE — Telephone Encounter (Signed)
Send message to heather  

## 2019-11-15 NOTE — Telephone Encounter (Signed)
Mirena reserved for this patient.

## 2019-11-16 ENCOUNTER — Other Ambulatory Visit: Payer: Self-pay

## 2019-11-16 MED ORDER — ASPIRIN EC 81 MG PO TBEC: 81.0000 mg | DELAYED_RELEASE_TABLET | Freq: Every day | ORAL | 2 refills | Status: DC

## 2019-11-16 NOTE — Telephone Encounter (Signed)
Appointment moved to 1/26 at 330

## 2019-11-19 ENCOUNTER — Ambulatory Visit: Payer: 59 | Admitting: Nurse Practitioner

## 2019-11-19 NOTE — Telephone Encounter (Signed)
Noted. Mirena placed back in stock. Will reserve closer to apt date.

## 2019-11-20 ENCOUNTER — Ambulatory Visit: Payer: 59 | Admitting: Obstetrics and Gynecology

## 2019-12-20 ENCOUNTER — Telehealth: Payer: Self-pay

## 2019-12-20 NOTE — Telephone Encounter (Signed)
LMOM TO CONFIRM AND SCREEN FOR 12-24-19.

## 2019-12-24 ENCOUNTER — Encounter: Payer: 59 | Admitting: Nurse Practitioner

## 2020-01-04 NOTE — Telephone Encounter (Signed)
Patient is reschedule for 03/04/20 for replacement due to covid cases

## 2020-01-08 ENCOUNTER — Ambulatory Visit: Payer: 59 | Admitting: Obstetrics and Gynecology

## 2020-01-14 NOTE — Telephone Encounter (Signed)
Noted. Will reserve closer to apt date.

## 2020-01-27 ENCOUNTER — Ambulatory Visit (INDEPENDENT_AMBULATORY_CARE_PROVIDER_SITE_OTHER)
Admission: RE | Admit: 2020-01-27 | Discharge: 2020-01-27 | Disposition: A | Discharge status: Home / Self Care | Payer: 59 | Source: Ambulatory Visit

## 2020-01-27 DIAGNOSIS — N390 Urinary tract infection, site not specified: Secondary | ICD-10-CM

## 2020-01-27 MED ORDER — NITROFURANTOIN MONOHYD MACRO 100 MG PO CAPS: 100.0000 mg | ORAL_CAPSULE | Freq: Two times a day (BID) | ORAL | 0 refills | Status: DC

## 2020-01-27 NOTE — ED Provider Notes (Signed)
Virtual Visit via Video Note:  [name]  initiated request for Telemedicine visit with [facility] Ur[facility] team. I connected with [name]  on 01/27/2020 at 5:45 PM  for a synchronized telemedicine visit using a video enabled HIPPA compliant telemedicine application. I verified that I am speaking with [name]  using two identifiers. Emerson Monte, FNP  was physically located in a [facility] Urgent care site and LEAYA MCCORKLE was located at a different location.   The limitations of evaluation and management by telemedicine as well as the availability of in-person appointments were discussed. Patient was informed that she  may incur a bill ( including co-pay) for this virtual visit encounter. [name]  expressed understanding and gave verbal consent to proceed with virtual visit.     History of Present Illness:[name]  is a 54 y.o. female presents with recurrent Hx of UTI present via video with a complaint of recurrent dysuria and bladder spasm.  She denies a precipitating event or recent sexual encounter.  She has tried ciprofloxacin and pyridium without relief.  Her symptoms are made worse with urination.  She reports similar symptoms in the past that improved with ciprofloxacin and pyridium..  She complains of decreased amount, increased urgency.  She denies fever, chills, nausea, vomiting, abdominal pain, flank pain, hematuria, or incontinence. She is requesting a different antibiotic.  Past Medical History:  Diagnosis Date  . Berger's disease 2011  . Family history of adverse reaction to anesthesia    sister and daughter difficult to wake up  . Folic acid deficiency   . GERD (gastroesophageal reflux disease)   . Hypertension   . Hypothyroidism   . IDA (iron deficiency anemia)   . Thrombocytosis ([facility])     Allergies  Allergen Reactions  . Tape Rash    Some tapes cause a rash, possible the paper tape.        Observations/Objective:VITALS: Per patient  if applicable, see vitals. GENERAL: Alert, appears well and in no acute distress. HEENT: Atraumatic, conjunctiva clear, no obvious abnormalities on inspection of external nose and ears. NECK: Normal movements of the head and neck. CARDIOPULMONARY: No increased WOB. Speaking in clear sentences. I:E ratio WNL.  MS: Moves all visible extremities without noticeable abnormality. PSYCH: Pleasant and cooperative, well-groomed. Speech normal rate and rhythm. Affect is appropriate. Insight and judgement are appropriate. Attention is focused, linear, and appropriate.  NEURO: CN grossly intact. Oriented as arrived to appointment on time with no prompting. Moves both UE equally.  SKIN: No obvious lesions, wounds, erythema, or cyanosis noted on face or hands.     Assessment and Plan: Recurrent UTI Macrobid was prescribed To continue to take Pyridium for symptom relief  Follow Up Instructions: Follow up with primary care To return or go to ED for worsening of symptom   I discussed the assessment and treatment plan with the patient. The patient was provided an opportunity to ask questions and all were answered. The patient agreed with the plan and demonstrated an understanding of the instructions.   The patient was advised to call back or seek an in-person evaluation if the symptoms worsen or if the condition fails to improve as anticipated.  I provided 15 minutes of non-face-to-face time during this encounter.    Emerson Monte, FNP  01/27/2020 5:45 PM         Emerson Monte, FNP 01/27/20 [phone]

## 2020-01-27 NOTE — Discharge Instructions (Addendum)
Push fluids and get plenty of rest.   Take antibiotic as directed and to completion Take pyridium as prescribed and as needed for symptomatic relief Follow up with PCP if symptoms persists Return here or go to ER if you have any new or worsening symptoms such as fever, worsening abdominal pain, nausea/vomiting, flank pain

## 2020-02-29 NOTE — Telephone Encounter (Signed)
Mirena reserved for this patient.

## 2020-03-02 ENCOUNTER — Encounter: Payer: Self-pay | Admitting: Nurse Practitioner

## 2020-03-03 ENCOUNTER — Telehealth: Payer: Self-pay

## 2020-03-04 ENCOUNTER — Ambulatory Visit: Payer: 59 | Admitting: Obstetrics and Gynecology

## 2020-03-04 NOTE — Telephone Encounter (Signed)
Pt advised that due to her symptoms need to go to ER as per heather and adam she said she going talk to her husband and call us back

## 2020-03-04 NOTE — Telephone Encounter (Signed)
After some review, she has a very complicated history, and I feel like waiting to get labs done and then appointment to follow up, may be dangerous if the issue is pancreatitis or some other massive infection in her abdomen. I really feel like she should be seen in the ER, espcially since it's been going on for some time.

## 2020-03-04 NOTE — Telephone Encounter (Signed)
She wants me to do labs then follow up with her virtually. I'm not sure how I feel about all of this. With the complicated nature of her health issues, I do feel like she should go to ER, but I feel like she will refuse.

## 2020-03-05 ENCOUNTER — Other Ambulatory Visit: Payer: Self-pay | Admitting: Nurse Practitioner

## 2020-03-05 ENCOUNTER — Telehealth: Payer: Self-pay

## 2020-03-05 ENCOUNTER — Other Ambulatory Visit
Admission: RE | Admit: 2020-03-05 | Discharge: 2020-03-05 | Disposition: A | Discharge status: Home / Self Care | Payer: 59 | Source: Ambulatory Visit | Attending: Nurse Practitioner | Admitting: Nurse Practitioner

## 2020-03-05 DIAGNOSIS — R109 Unspecified abdominal pain: Secondary | ICD-10-CM | POA: Diagnosis present

## 2020-03-05 DIAGNOSIS — E039 Hypothyroidism, unspecified: Secondary | ICD-10-CM | POA: Diagnosis not present

## 2020-03-05 DIAGNOSIS — E063 Autoimmune thyroiditis: Secondary | ICD-10-CM

## 2020-03-05 DIAGNOSIS — Z8719 Personal history of other diseases of the digestive system: Secondary | ICD-10-CM | POA: Insufficient documentation

## 2020-03-05 LAB — COMPREHENSIVE METABOLIC PANEL
ALT: 32 U/L (ref 0–44)
AST: 32 U/L (ref 15–41)
Albumin: 4.2 g/dL (ref 3.5–5.0)
Alkaline Phosphatase: 89 U/L (ref 38–126)
Anion gap: 10 (ref 5–15)
BUN: 9 mg/dL (ref 6–20)
CO2: 25 mmol/L (ref 22–32)
Calcium: 9 mg/dL (ref 8.9–10.3)
Chloride: 103 mmol/L (ref 98–111)
Creatinine, Ser: 0.77 mg/dL (ref 0.44–1.00)
GFR calc Af Amer: >60 mL/min (ref >60)
GFR calc non Af Amer: >60 mL/min (ref >60)
Glucose, Bld: 104 mg/dL — ABNORMAL HIGH (ref 70–99)
Potassium: 3.6 mmol/L (ref 3.5–5.1)
Sodium: 138 mmol/L (ref 135–145)
Total Bilirubin: 1 mg/dL (ref 0.3–1.2)
Total Protein: 8.5 g/dL — ABNORMAL HIGH (ref 6.5–8.1)

## 2020-03-05 LAB — TSH: TSH: 10.865 u[IU]/mL — ABNORMAL HIGH (ref 0.350–4.500)

## 2020-03-05 LAB — AMYLASE: Amylase: 649 U/L — ABNORMAL HIGH (ref 28–100)

## 2020-03-05 LAB — LIPASE, BLOOD: Lipase: 27 U/L (ref 11–51)

## 2020-03-05 LAB — T4, FREE: Free T4: 1.36 ng/dL — ABNORMAL HIGH (ref 0.61–1.12)

## 2020-03-05 MED ORDER — LEVOTHYROXINE SODIUM 200 MCG PO TABS: ORAL_TABLET | ORAL | 3 refills | Status: DC

## 2020-03-05 NOTE — Progress Notes (Signed)
TSH >10 with free T4 1.32. will increase dose of levothyroxine 258mcg, to take one tablet po six days per week and take 1/2 tablet po on Saturdays. Will need to recheck in 2 months, and really, she should see endocrinology, as it is unusual to have both TSH and Free t4 elevated.

## 2020-03-05 NOTE — Progress Notes (Signed)
Please let the patient know that I will have her TSH >10 with free T4 1.32. will increase dose of levothyroxine 268mcg, to take one tablet po six days per week and take 1/2 tablet po on Saturdays. Will need to recheck in 2 months, and really, she should see endocrinology, as it is unusual to have both TSH and Free t4 elevated. Thanks

## 2020-03-05 NOTE — Telephone Encounter (Signed)
As per [name] due to abnormal labs and due to her symptoms from last 3 days and also pt had history of pancreatitis we advised her go to ER  And we going to treat her thyroid

## 2020-03-06 ENCOUNTER — Telehealth: Payer: Self-pay

## 2020-03-06 NOTE — Progress Notes (Signed)
Pt notified for labs  

## 2020-03-06 NOTE — Telephone Encounter (Signed)
lmom to call us back

## 2020-03-07 ENCOUNTER — Ambulatory Visit: Payer: 59 | Admitting: Nurse Practitioner

## 2020-03-07 ENCOUNTER — Telehealth: Payer: Self-pay

## 2020-03-07 NOTE — Telephone Encounter (Signed)
Patient has had to r/s her AE/IUD Replacement/EMB. She's been having symptoms lately. She thought it was a stomach bug. She went to [facility] ER 03/05/20 & her Amylase was 649. She had a scan that showed normal liver, gb, pancreas are all fine. She has a bladder infection which they are treating. She has done some research. She is inquiring if [facility] will order her a TV U/S to be sure she doesn't have ovarian cancer or anything like that. She would like it done at the same time of her other apts here since she is disabled and has autoimmune disease. States she's only been out 2x since Covid and she's had Covid 2x. Cb#[phone]

## 2020-03-07 NOTE — Progress Notes (Signed)
Pt notified for labs result  

## 2020-03-10 ENCOUNTER — Encounter: Payer: Self-pay | Admitting: Nurse Practitioner

## 2020-03-10 ENCOUNTER — Ambulatory Visit (INDEPENDENT_AMBULATORY_CARE_PROVIDER_SITE_OTHER): Payer: 59 | Admitting: Nurse Practitioner

## 2020-03-10 ENCOUNTER — Other Ambulatory Visit: Payer: Self-pay | Admitting: Obstetrics and Gynecology

## 2020-03-10 VITALS — Ht 67.0 in | Wt 350.0 lb

## 2020-03-10 DIAGNOSIS — Z9049 Acquired absence of other specified parts of digestive tract: Secondary | ICD-10-CM | POA: Diagnosis not present

## 2020-03-10 DIAGNOSIS — N8501 Benign endometrial hyperplasia: Secondary | ICD-10-CM

## 2020-03-10 DIAGNOSIS — R1084 Generalized abdominal pain: Secondary | ICD-10-CM | POA: Diagnosis not present

## 2020-03-10 DIAGNOSIS — Z30431 Encounter for routine checking of intrauterine contraceptive device: Secondary | ICD-10-CM

## 2020-03-10 DIAGNOSIS — Z6841 Body Mass Index (BMI) 40.0 and over, adult: Secondary | ICD-10-CM

## 2020-03-10 DIAGNOSIS — Z9884 Bariatric surgery status: Secondary | ICD-10-CM

## 2020-03-10 NOTE — Telephone Encounter (Signed)
Order is in.

## 2020-03-10 NOTE — Telephone Encounter (Signed)
mirena replacement with [facility] 04/02/20

## 2020-03-10 NOTE — Progress Notes (Signed)
[facility] [place], [place] [address]  Internal MEDICINE  Telephone Visit  Patient Name: [name]  [id number]  CI:[id number]  Date of Service: 03/23/2020  I connected with the patient at 4:34pm by webcam and verified the patients identity using two identifiers.   I discussed the limitations, risks, security and privacy concerns of performing an evaluation and management service by webcam and the availability of in person appointments. I also discussed with the patient that there may be a patient responsible charge related to the service.  The patient expressed understanding and agrees to proceed.    Chief Complaint  Patient presents with  . Telephone Assessment  . Telephone Screen  . Medical Management of Chronic Issues    weight nanagment   . Follow-up    sleeping issue     The patient has been contacted via webcam for follow up visit due to concerns for spread of novel coronavirus. The patient was recently seen in ER due to abdominal pain. She had labs done. Amylase was moderately elevated. She had CT scan of the abdomen. She is status post sleeve gastrectomy and prior bowel resection with anastomosis in the right and left lower quadrants and right lower quadrant end ileostomy. No evidence of bowel obstruction. No wall thickening. Appendix is surgically absent. Swirling of the superior mesenteric vasculature, with proximal jejunal loops extending into the right lower quadrant is favored postsurgical. Peritoneum: No free air. No free fluid. Liver: Normal size and density. Smooth surface contour. No focal hepatic lesions. Gallbladder/Biliary system: No radiopaque gallstones. No intrahepatic or extrahepatic biliary duct dilatation.  Pancreas: Normal size and attenuation. No ductal dilation.Spleen: Normal.  Adrenals: Minimal nodular thickening of the left adrenal gland. Kidneys: Punctate nonobstructing right lower pole calculus. No hydronephrosis. No solid renal  masses. Bladder: Normal.She was treated with different antibiotic for UTI. She is gradually feeling better. Unclear as to why amylase was so elevated. Initially, amylase level was 649.  Rechecked in ER and amylase was 636.  The patient is very concerned about weight and how obesity is causing her to have comorbid health conditions. She is spending most of her time in bed. Difficult for her to even move around at all, let alone, get out of her home and go to any sort of appointments or anything outside of the home. She has already had gastric sleeve surgery. She initially lost 88 pounds, but then weight loss slowed and she eventually gained the weight. As an adolescent, the patient had significant ulcerative colitis. she had BCIR pouch surgery to remove effected colon. She should have referral to GI provider. She has found one at [facility] who specializes in the surgery she had in the past. She will also have upcoming appointment with nutritionist.       Current Medication: Outpatient Encounter Medications as of 03/10/2020  Medication Sig  . amLODipine (NORVASC) 10 MG tablet Take 1 tablet (10 mg total) by mouth at bedtime.  Marland Kitchen aspirin EC 81 MG tablet Take 1 tablet (81 mg total) by mouth daily.  . folic acid (FOLVITE) 1 MG tablet Take 1 tablet (1 mg total) by mouth daily.  Marland Kitchen levonorgestrel (MIRENA, 52 MG,) 20 MCG/24HR IUD by Intrauterine route.  Marland Kitchen levothyroxine (SYNTHROID) 200 MCG tablet take 200 mcg po six days a week and on saturdays take 1/2 tablet PO.  . Multiple Vitamins-Minerals (VITAMIN D3 COMPLETE PO) Take 1 capsule by mouth 2 (two) times a week.  . nitrofurantoin, macrocrystal-monohydrate, (  MACROBID) 100 MG capsule Take 1 capsule (100 mg total) by mouth 2 (two) times daily.  . pantoprazole (PROTONIX) 40 MG tablet Take 1 tablet (40 mg total) by mouth 2 (two) times daily.  . phenazopyridine (PYRIDIUM) 200 MG tablet Take 1 tablet (200 mg total) by mouth 3 (three) times daily as  needed for pain.  . Vitamin D, Ergocalciferol, (DRISDOL) 1.25 MG (50000 UT) CAPS capsule TAKE 1 CAPSULE ONCE A WEEK  . [DISCONTINUED] ciprofloxacin (CIPRO) 500 MG tablet TAKE 1 TABLET BY MOUTH TWICE A DAY FOR 10 DAYS FOR UTI  . [DISCONTINUED] famotidine (PEPCID) 20 MG tablet Take 1 tablet (20 mg total) by mouth 2 (two) times daily.  . fluconazole (DIFLUCAN) 150 MG tablet Can take additional dose three days later if symptoms persist  . mupirocin ointment (BACTROBAN) 2 % Apply to affected area bid for 7 to 10 days  . tiZANidine (ZANAFLEX) 4 MG tablet Take 1/2 to 1 tablet po QPM prn muscle pain   No facility-administered encounter medications on file as of 03/10/2020.    Surgical History: Past Surgical History:  Procedure Laterality Date  . ADENOIDECTOMY     Small child  . APPENDECTOMY     Age 50  . COLON SURGERY  1980   ileostomy and internal pouch age 29  . DILATATION & CURETTAGE/HYSTEROSCOPY WITH MYOSURE N/A 05/02/2015   Procedure: DILATATION & CURETTAGE/HYSTEROSCOPY WITH MYOSURE;  Surgeon: Malachy Mood, MD;  Location: [facility] ORS;  Service: Gynecology;  Laterality: N/A;  . LAPAROSCOPIC GASTRIC SLEEVE RESECTION  07/06/2016    Medical History: Past Medical History:  Diagnosis Date  . Berger's disease 2011  . Family history of adverse reaction to anesthesia    sister and daughter difficult to wake up  . Folic acid deficiency   . GERD (gastroesophageal reflux disease)   . Hypertension   . Hypothyroidism   . IDA (iron deficiency anemia)   . Thrombocytosis ([place])     Family History: Family History  Problem Relation Age of Onset  . Hypertension Mother   . Congestive Heart Failure Father   . Hypertension Father   . Congestive Heart Failure Maternal Grandmother   . Colon cancer Maternal Grandfather 16  . Skin cancer Maternal Aunt        Basal cell  . Skin cancer Maternal Uncle        Basal cell  . Skin cancer Cousin        Basal Cell    Social History   Socioeconomic  History  . Marital status: Married    Spouse name: Not on file  . Number of children: Not on file  . Years of education: Not on file  . Highest education level: Not on file  Occupational History  . Not on file  Tobacco Use  . Smoking status: Former Smoker    Types: Cigarettes    Quit date: 04/28/2010    Years since quitting: 9.9  . Smokeless tobacco: Never Used  Substance and Sexual Activity  . Alcohol use: No    Alcohol/week: 0.0 standard drinks  . Drug use: No  . Sexual activity: Yes    Birth control/protection: None  Other Topics Concern  . Not on file  Social History Narrative  . Not on file   Social Determinants of Health   Financial Resource Strain:   . Difficulty of Paying Living Expenses:   Food Insecurity:   . Worried About Charity fundraiser in the Last Year:   . Ran  Out of Food in the Last Year:   Transportation Needs:   . Lack of Transportation (Medical):   Marland Kitchen Lack of Transportation (Non-Medical):   Physical Activity:   . Days of Exercise per Week:   . Minutes of Exercise per Session:   Stress:   . Feeling of Stress :   Social Connections:   . Frequency of Communication with Friends and Family:   . Frequency of Social Gatherings with Friends and Family:   . Attends Religious Services:   . Active Member of Clubs or Organizations:   . Attends Archivist Meetings:   Marland Kitchen Marital Status:   Intimate Partner Violence:   . Fear of Current or Ex-Partner:   . Emotionally Abused:   Marland Kitchen Physically Abused:   . Sexually Abused:       Review of Systems  Constitutional: Positive for activity change, fatigue and unexpected weight change. Negative for chills.       The patient continues to have weight gain which is significantly impacting her ability to participate in activities of daily living, and especially activities in which she enjoys.   HENT: Negative for congestion, postnasal drip, rhinorrhea, sneezing and sore throat.   Respiratory: Positive for  shortness of breath. Negative for cough and chest tightness.        There is shortness of breath with exertion.  Cardiovascular: Negative for chest pain and palpitations.  Gastrointestinal: Positive for abdominal pain. Negative for constipation, diarrhea, nausea and vomiting.       Has improved   Musculoskeletal: Positive for arthralgias, back pain and myalgias. Negative for joint swelling and neck pain.  Skin: Negative for rash.  Neurological: Negative for dizziness, tremors, numbness and headaches.  Hematological: Negative for adenopathy. Does not bruise/bleed easily.  Psychiatric/Behavioral: Negative for behavioral problems (Depression), sleep disturbance and suicidal ideas. The patient is nervous/anxious.     Today's Vitals   03/10/20 1553  Weight: (!) 350 lb (158.8 kg)  Height: 5\' 7"  (1.702 m)   Body mass index is 54.82 kg/m.  Observation/Objective:   The patient is alert and oriented. She is pleasant and answers all questions appropriately. Breathing is non-labored. She is in no acute distress at this time.    Assessment/Plan: 1. Generalized abdominal pain Unclear etiology. Recent labs indicate severe elevation of amylase level. CT abdomen and pelvis did not indicate pancreatitis or other acute abnormalities or inflammation. Refer to GI for further evaluation.  - Ambulatory referral to Gastroenterology  2. S/P total colectomy BCIR surgery in the past due to history of ulcerative colitis. Refer to GI for further evaluation.  - Ambulatory referral to Gastroenterology  3. S/P gastric bypass Refer to GI for further evaluation. Patient also to have appointment with nutritionist.  - Ambulatory referral to Gastroenterology  4. Morbid obesity with BMI of 50.0-59.9, adult ([place]) Refer to GI for further evaluation. Patient also to have appointment with nutritionist to discuss healthy ways for her to lose weight.   General Counseling: armetha redden understanding of the findings  of today's phone visit and agrees with plan of treatment. I have discussed any further diagnostic evaluation that may be needed or ordered today. We also reviewed her medications today. she has been encouraged to call the office with any questions or concerns that should arise related to todays visit.  This patient was seen by Leretha Pol FNP Collaboration with Dr Lavera Guise as a part of collaborative care agreement  Orders Placed This Encounter  Procedures  .  Ambulatory referral to Gastroenterology      Time spent: [place] [facility]

## 2020-03-10 NOTE — Telephone Encounter (Signed)
Noted. Will order to arrive by apt date/time. 

## 2020-03-10 NOTE — Telephone Encounter (Signed)
Patient is schedule for 04/02/20

## 2020-03-18 ENCOUNTER — Other Ambulatory Visit: Payer: Self-pay

## 2020-03-18 DIAGNOSIS — K219 Gastro-esophageal reflux disease without esophagitis: Secondary | ICD-10-CM

## 2020-03-18 MED ORDER — FAMOTIDINE 20 MG PO TABS: 20.0000 mg | ORAL_TABLET | Freq: Two times a day (BID) | ORAL | 3 refills | Status: DC

## 2020-03-19 ENCOUNTER — Other Ambulatory Visit: Payer: Self-pay

## 2020-03-19 DIAGNOSIS — K219 Gastro-esophageal reflux disease without esophagitis: Secondary | ICD-10-CM

## 2020-03-19 MED ORDER — FAMOTIDINE 20 MG PO TABS: 20.0000 mg | ORAL_TABLET | Freq: Two times a day (BID) | ORAL | 3 refills | Status: DC

## 2020-03-21 ENCOUNTER — Other Ambulatory Visit: Payer: Self-pay

## 2020-03-21 DIAGNOSIS — K219 Gastro-esophageal reflux disease without esophagitis: Secondary | ICD-10-CM

## 2020-03-21 MED ORDER — FAMOTIDINE 20 MG PO TABS: 20.0000 mg | ORAL_TABLET | Freq: Two times a day (BID) | ORAL | 3 refills | Status: DC

## 2020-03-23 DIAGNOSIS — R1084 Generalized abdominal pain: Secondary | ICD-10-CM | POA: Insufficient documentation

## 2020-03-23 DIAGNOSIS — Z9884 Bariatric surgery status: Secondary | ICD-10-CM | POA: Insufficient documentation

## 2020-03-23 DIAGNOSIS — Z9049 Acquired absence of other specified parts of digestive tract: Secondary | ICD-10-CM | POA: Insufficient documentation

## 2020-03-31 ENCOUNTER — Other Ambulatory Visit: Payer: Self-pay

## 2020-03-31 ENCOUNTER — Telehealth: Payer: Self-pay | Admitting: Nurse Practitioner

## 2020-03-31 MED ORDER — FLUCONAZOLE 150 MG PO TABS: ORAL_TABLET | ORAL | 0 refills | Status: DC

## 2020-03-31 NOTE — Telephone Encounter (Signed)
Per heather sent in diflucan for yeast infection.

## 2020-03-31 NOTE — Telephone Encounter (Signed)
Patient called regarding 2 requests,   She needs to pick up lab slip you guys discussed at last visit, and add urine cultures extensive(per her request).  Also please call in ABX for yeast infection (pharmacy cheap with good RX card).  Patient will pick up lab slip Wednesday please have ready. beth

## 2020-04-01 ENCOUNTER — Telehealth: Payer: Self-pay | Admitting: Nurse Practitioner

## 2020-04-01 NOTE — Telephone Encounter (Signed)
I'm going to have to open the note and take a look. Will be after patients today or first thing in the morning. I will have it ready when she comes. Can you send this note back to me so I can remember? Thanks.

## 2020-04-02 ENCOUNTER — Other Ambulatory Visit: Payer: Self-pay | Admitting: Nurse Practitioner

## 2020-04-02 ENCOUNTER — Other Ambulatory Visit (HOSPITAL_COMMUNITY)
Admission: RE | Admit: 2020-04-02 | Discharge: 2020-04-02 | Disposition: A | Discharge status: Home / Self Care | Payer: 59 | Source: Ambulatory Visit | Attending: Obstetrics and Gynecology | Admitting: Obstetrics and Gynecology

## 2020-04-02 ENCOUNTER — Ambulatory Visit (INDEPENDENT_AMBULATORY_CARE_PROVIDER_SITE_OTHER): Payer: 59

## 2020-04-02 ENCOUNTER — Encounter: Payer: Self-pay | Admitting: Obstetrics and Gynecology

## 2020-04-02 ENCOUNTER — Other Ambulatory Visit: Payer: Self-pay

## 2020-04-02 ENCOUNTER — Ambulatory Visit (INDEPENDENT_AMBULATORY_CARE_PROVIDER_SITE_OTHER): Payer: 59 | Admitting: Obstetrics and Gynecology

## 2020-04-02 VITALS — Ht 67.0 in

## 2020-04-02 DIAGNOSIS — Z30431 Encounter for routine checking of intrauterine contraceptive device: Secondary | ICD-10-CM

## 2020-04-02 DIAGNOSIS — N8501 Benign endometrial hyperplasia: Secondary | ICD-10-CM

## 2020-04-02 DIAGNOSIS — Z124 Encounter for screening for malignant neoplasm of cervix: Secondary | ICD-10-CM

## 2020-04-02 DIAGNOSIS — Z1239 Encounter for other screening for malignant neoplasm of breast: Secondary | ICD-10-CM | POA: Diagnosis not present

## 2020-04-02 DIAGNOSIS — R748 Abnormal levels of other serum enzymes: Secondary | ICD-10-CM

## 2020-04-02 DIAGNOSIS — E039 Hypothyroidism, unspecified: Secondary | ICD-10-CM

## 2020-04-02 DIAGNOSIS — B3731 Acute candidiasis of vulva and vagina: Secondary | ICD-10-CM

## 2020-04-02 DIAGNOSIS — N390 Urinary tract infection, site not specified: Secondary | ICD-10-CM

## 2020-04-02 DIAGNOSIS — R1084 Generalized abdominal pain: Secondary | ICD-10-CM

## 2020-04-02 DIAGNOSIS — Z01419 Encounter for gynecological examination (general) (routine) without abnormal findings: Secondary | ICD-10-CM

## 2020-04-02 DIAGNOSIS — B373 Candidiasis of vulva and vagina: Secondary | ICD-10-CM

## 2020-04-02 DIAGNOSIS — N951 Menopausal and female climacteric states: Secondary | ICD-10-CM

## 2020-04-02 MED ORDER — FLUCONAZOLE 150 MG PO TABS: ORAL_TABLET | ORAL | 2 refills | Status: DC

## 2020-04-02 NOTE — Progress Notes (Signed)
Lab slip prepared for patient and ready to pick up. I also sent in prescription for diflucan for her to use every other day for 5 doses. Repeat if needed.

## 2020-04-02 NOTE — Patient Instructions (Signed)
[facility] [address] [place] [address]  [facility] [place]  [address] [place] [address]  Phone: [phone]  

## 2020-04-03 ENCOUNTER — Ambulatory Visit (INDEPENDENT_AMBULATORY_CARE_PROVIDER_SITE_OTHER): Payer: 59 | Admitting: Nurse Practitioner

## 2020-04-03 ENCOUNTER — Encounter: Payer: Self-pay | Admitting: Nurse Practitioner

## 2020-04-03 DIAGNOSIS — R748 Abnormal levels of other serum enzymes: Secondary | ICD-10-CM | POA: Diagnosis not present

## 2020-04-03 DIAGNOSIS — E038 Other specified hypothyroidism: Secondary | ICD-10-CM

## 2020-04-03 DIAGNOSIS — Z9049 Acquired absence of other specified parts of digestive tract: Secondary | ICD-10-CM | POA: Diagnosis not present

## 2020-04-03 DIAGNOSIS — Z6841 Body Mass Index (BMI) 40.0 and over, adult: Secondary | ICD-10-CM

## 2020-04-03 DIAGNOSIS — R1084 Generalized abdominal pain: Secondary | ICD-10-CM

## 2020-04-03 DIAGNOSIS — E063 Autoimmune thyroiditis: Secondary | ICD-10-CM

## 2020-04-03 LAB — CBC
Hematocrit: 46.5 % (ref 34.0–46.6)
Hemoglobin: 15.7 g/dL (ref 11.1–15.9)
MCH: 31.9 pg (ref 26.6–33.0)
MCHC: 33.8 g/dL (ref 31.5–35.7)
MCV: 95 fL (ref 79–97)
Platelets: 345 10*3/uL (ref 150–450)
RBC: 4.92 x10E6/uL (ref 3.77–5.28)
RDW: 13.1 % (ref 11.7–15.4)
WBC: 10.2 10*3/uL (ref 3.4–10.8)

## 2020-04-03 LAB — COMPREHENSIVE METABOLIC PANEL
ALT: 30 IU/L (ref 0–32)
AST: 29 IU/L (ref 0–40)
Albumin/Globulin Ratio: 1.2 (ref 1.2–2.2)
Albumin: 4.3 g/dL (ref 3.8–4.9)
Alkaline Phosphatase: 115 IU/L (ref 39–117)
BUN/Creatinine Ratio: 13 (ref 9–23)
BUN: 10 mg/dL (ref 6–24)
Bilirubin Total: 0.8 mg/dL (ref 0.0–1.2)
CO2: 20 mmol/L (ref 20–29)
Calcium: 9.5 mg/dL (ref 8.7–10.2)
Chloride: 101 mmol/L (ref 96–106)
Creatinine, Ser: 0.8 mg/dL (ref 0.57–1.00)
GFR calc Af Amer: 99 mL/min/{1.73_m2} (ref >59)
GFR calc non Af Amer: 86 mL/min/{1.73_m2} (ref >59)
Globulin, Total: 3.5 g/dL (ref 1.5–4.5)
Glucose: 105 mg/dL — ABNORMAL HIGH (ref 65–99)
Potassium: 4.4 mmol/L (ref 3.5–5.2)
Sodium: 139 mmol/L (ref 134–144)
Total Protein: 7.8 g/dL (ref 6.0–8.5)

## 2020-04-03 LAB — THYROID PANEL WITH TSH
Free Thyroxine Index: 3.4 (ref 1.2–4.9)
T3 Uptake Ratio: 34 % (ref 24–39)
T4, Total: 10.1 ug/dL (ref 4.5–12.0)
TSH: 5.41 u[IU]/mL — ABNORMAL HIGH (ref 0.450–4.500)

## 2020-04-03 LAB — LIPASE: Lipase: 30 U/L (ref 14–72)

## 2020-04-03 LAB — ESTRADIOL: Estradiol: 39.6 pg/mL

## 2020-04-03 LAB — AMYLASE: Amylase: 642 U/L (ref 31–110)

## 2020-04-03 LAB — FOLLICLE STIMULATING HORMONE: FSH: 40.7 m[IU]/mL

## 2020-04-03 NOTE — Progress Notes (Signed)
[facility] [place], [place] [address]  Internal MEDICINE  Telephone Visit  Patient Name: [name]  [id number]  [id number]  Date of Service: 04/06/2020  I connected with the patient at 3:53pm by webcam and verified the patients identity using two identifiers.   I discussed the limitations, risks, security and privacy concerns of performing an evaluation and management service by webcam and the availability of in person appointments. I also discussed with the patient that there may be a patient responsible charge related to the service.  The patient expressed understanding and agrees to proceed.    Chief Complaint  Patient presents with  . Telephone Screen  . Telephone Assessment  . Hypertension  . Follow-up    labs and need referral     The patient has been contacted via webcam for follow up visit due to concerns for spread of novel coronavirus. Repeat labs were done yesterday. Amylase is still considerably elevated. She was referred to GI surgeon after her last visit due to the generalized abdominal pain and elevated amylase levels. The GI specialist has not received referral information. I have collected more specific information to resend referral. CT scan of the abdomen, done at her ER visit showed  She had CT scan of the abdomen. She is status post sleeve gastrectomy and prior bowel resection with anastomosis in the right and left lower quadrants and right lower quadrant end ileostomy. No evidence of bowel obstruction. No wall thickening. Appendix is surgically absent. Swirling of the superior mesenteric vasculature, with proximal jejunal loops extending into the right lower quadrant is favored postsurgical. Peritoneum: No free air. No free fluid. Liver: Normal size and density. Smooth surface contour. No focal hepatic lesions. Gallbladder/Biliary system: No radiopaque gallstones. No intrahepatic or extrahepatic biliary duct dilatation.  Pancreas: Normal  size and attenuation. No ductal dilation.Spleen: Normal.  Adrenals: Minimal nodular thickening of the left adrenal gland. Kidneys: Punctate nonobstructing right lower pole calculus. No hydronephrosis. No solid renal masses. Bladder: Normal. The patient is very concerned about weight and how obesity is causing her to have comorbid health conditions. She is spending most of her time in bed. Difficult for her to even move around at all, let alone, get out of her home and go to any sort of appointments or anything outside of the home. She has already had gastric sleeve surgery. She initially lost 88 pounds, but then weight loss slowed and she eventually gained the weight. As an adolescent, the patient had significant ulcerative colitis. she had BCIR pouch surgery to remove effected colon. She should have referral to GI provider. She has found one at [facility] who specializes in the surgery she had in the past. She will also have upcoming appointment with nutritionist.        Current Medication: Outpatient Encounter Medications as of 04/03/2020  Medication Sig  . amLODipine (NORVASC) 10 MG tablet Take 1 tablet (10 mg total) by mouth at bedtime.  Marland Kitchen aspirin EC 81 MG tablet Take 1 tablet (81 mg total) by mouth daily.  . famotidine (PEPCID) 20 MG tablet Take 1 tablet (20 mg total) by mouth 2 (two) times daily.  . fluconazole (DIFLUCAN) 150 MG tablet Take 1 tablet po QOD for 5 doses. Repeat treatment if needed.  . folic acid (FOLVITE) 1 MG tablet Take 1 tablet (1 mg total) by mouth daily.  Marland Kitchen levonorgestrel (MIRENA, 52 MG,) 20 MCG/24HR IUD by Intrauterine route.  Marland Kitchen levothyroxine (SYNTHROID) 200 MCG tablet  take 200 mcg po six days a week and on saturdays take 1/2 tablet PO.  . Multiple Vitamins-Minerals (VITAMIN D3 COMPLETE PO) Take 1 capsule by mouth 2 (two) times a week.  . pantoprazole (PROTONIX) 40 MG tablet Take 1 tablet (40 mg total) by mouth 2 (two) times daily.  . phenazopyridine  (PYRIDIUM) 200 MG tablet Take 1 tablet (200 mg total) by mouth 3 (three) times daily as needed for pain.  . Vitamin D, Ergocalciferol, (DRISDOL) 1.25 MG (50000 UT) CAPS capsule TAKE 1 CAPSULE ONCE A WEEK   No facility-administered encounter medications on file as of 04/03/2020.    Surgical History: Past Surgical History:  Procedure Laterality Date  . ADENOIDECTOMY     Small child  . APPENDECTOMY     Age 40  . COLON SURGERY  1980   ileostomy and internal pouch age 88  . DILATATION & CURETTAGE/HYSTEROSCOPY WITH MYOSURE N/A 05/02/2015   Procedure: DILATATION & CURETTAGE/HYSTEROSCOPY WITH MYOSURE;  Surgeon: Malachy Mood, MD;  Location: [facility] ORS;  Service: Gynecology;  Laterality: N/A;  . LAPAROSCOPIC GASTRIC SLEEVE RESECTION  07/06/2016    Medical History: Past Medical History:  Diagnosis Date  . Berger's disease 2011  . Family history of adverse reaction to anesthesia    sister and daughter difficult to wake up  . Folic acid deficiency   . GERD (gastroesophageal reflux disease)   . Hypertension   . Hypothyroidism   . IDA (iron deficiency anemia)   . Thrombocytosis ([place])     Family History: Family History  Problem Relation Age of Onset  . Hypertension Mother   . Congestive Heart Failure Father   . Hypertension Father   . Congestive Heart Failure Maternal Grandmother   . Colon cancer Maternal Grandfather 9  . Skin cancer Maternal Aunt        Basal cell  . Skin cancer Maternal Uncle        Basal cell  . Skin cancer Cousin        Basal Cell    Social History   Socioeconomic History  . Marital status: Married    Spouse name: Not on file  . Number of children: Not on file  . Years of education: Not on file  . Highest education level: Not on file  Occupational History  . Not on file  Tobacco Use  . Smoking status: Former Smoker    Types: Cigarettes    Quit date: 04/28/2010    Years since quitting: 9.9  . Smokeless tobacco: Never Used  Substance and Sexual  Activity  . Alcohol use: No    Alcohol/week: 0.0 standard drinks  . Drug use: No  . Sexual activity: Yes    Birth control/protection: None  Other Topics Concern  . Not on file  Social History Narrative  . Not on file   Social Determinants of Health   Financial Resource Strain:   . Difficulty of Paying Living Expenses:   Food Insecurity:   . Worried About Charity fundraiser in the Last Year:   . Arboriculturist in the Last Year:   Transportation Needs:   . Film/video editor (Medical):   Marland Kitchen Lack of Transportation (Non-Medical):   Physical Activity:   . Days of Exercise per Week:   . Minutes of Exercise per Session:   Stress:   . Feeling of Stress :   Social Connections:   . Frequency of Communication with Friends and Family:   . Frequency of Social  Gatherings with Friends and Family:   . Attends Religious Services:   . Active Member of Clubs or Organizations:   . Attends Archivist Meetings:   Marland Kitchen Marital Status:   Intimate Partner Violence:   . Fear of Current or Ex-Partner:   . Emotionally Abused:   Marland Kitchen Physically Abused:   . Sexually Abused:       Review of Systems  Constitutional: Positive for activity change, fatigue and unexpected weight change. Negative for chills.       The patient continues to have weight gain which is significantly impacting her ability to participate in activities of daily living, and especially activities in which she enjoys.   HENT: Negative for congestion, postnasal drip, rhinorrhea, sneezing and sore throat.   Respiratory: Positive for shortness of breath. Negative for cough and chest tightness.        There is shortness of breath with exertion.  Cardiovascular: Negative for chest pain and palpitations.  Gastrointestinal: Positive for abdominal pain. Negative for constipation, diarrhea, nausea and vomiting.       Has improved   Endocrine: Negative for cold intolerance, heat intolerance, polydipsia and polyuria.       Thyroid  panel recently checked and already showing improvement after increase in levothyroxine dose at most recent check.   Genitourinary:       Frequent UTI  Musculoskeletal: Positive for arthralgias, back pain and myalgias. Negative for joint swelling and neck pain.  Skin: Negative for rash.  Neurological: Negative for dizziness, tremors, numbness and headaches.  Hematological: Negative for adenopathy. Does not bruise/bleed easily.  Psychiatric/Behavioral: Negative for behavioral problems (Depression), sleep disturbance and suicidal ideas. The patient is nervous/anxious.     Vital Signs: There were no vitals taken for this visit.   Observation/Objective:   The patient is alert and oriented. She is pleasant and answers all questions appropriately. Breathing is non-labored. She is in no acute distress at this time. The patient does sound worried and anxious on the phone.    Assessment/Plan: 1. Generalized abdominal pain Reviewed recent labs which continue to show elevated amylase levels. Will make sure referral to GI surgeon/provider is made urgently.  - Ambulatory referral to Gastroenterology  2. S/P total colectomy Will make sure referral to GI surgeon/provider is made urgently.  - Ambulatory referral to Gastroenterology  3. Elevated amylase Reviewed recent labs which continue to show elevated amylase levels. Will make sure referral to GI surgeon/provider is made urgently.   4. Hypothyroidism due to Hashimoto's thyroiditis Reviewed labs, rechecking thyroid panel. Already showing improvement of thyroid levels. Will continue to monitor closely.   5. Morbid obesity with BMI of 50.0-59.9, adult ([place]) Will make sure referral to GI surgeon/provider is made urgently.  - Ambulatory referral to Gastroenterology  General Counseling: [name] understanding of the findings of today's phone visit and agrees with plan of treatment. I have discussed any further diagnostic evaluation that  may be needed or ordered today. We also reviewed her medications today. she has been encouraged to call the office with any questions or concerns that should arise related to todays visit.    Orders Placed This Encounter  Procedures  . Ambulatory referral to Gastroenterology   This patient was seen by Leretha Pol FNP Collaboration with Dr Lavera Guise as a part of collaborative care agreement  Time spent: 72 Minutes    Dr Lavera Guise Internal medicine

## 2020-04-04 ENCOUNTER — Encounter: Payer: Self-pay | Admitting: Nurse Practitioner

## 2020-04-04 LAB — CYTOLOGY - PAP
Comment: NEGATIVE
Diagnosis: NEGATIVE
High risk HPV: NEGATIVE

## 2020-04-04 LAB — URINE CULTURE

## 2020-04-05 ENCOUNTER — Encounter: Payer: Self-pay | Admitting: Obstetrics & Gynecology

## 2020-04-05 ENCOUNTER — Encounter: Payer: Self-pay | Admitting: Nurse Practitioner

## 2020-04-06 ENCOUNTER — Other Ambulatory Visit: Payer: Self-pay | Admitting: Obstetrics & Gynecology

## 2020-04-06 ENCOUNTER — Other Ambulatory Visit: Payer: Self-pay | Admitting: Nurse Practitioner

## 2020-04-06 DIAGNOSIS — R748 Abnormal levels of other serum enzymes: Secondary | ICD-10-CM | POA: Insufficient documentation

## 2020-04-06 DIAGNOSIS — N390 Urinary tract infection, site not specified: Secondary | ICD-10-CM

## 2020-04-06 MED ORDER — NITROFURANTOIN MONOHYD MACRO 100 MG PO CAPS: ORAL_CAPSULE | ORAL | 2 refills | Status: DC

## 2020-04-06 MED ORDER — CEPHALEXIN 500 MG PO CAPS: 500.0000 mg | ORAL_CAPSULE | Freq: Four times a day (QID) | ORAL | 2 refills | Status: DC

## 2020-04-06 NOTE — Progress Notes (Signed)
Pt has called twice this weekend due to urine culture seen in MyChart and desire for treatment I did not know long term plan of Dr Georgianne Fick based on documentation and preferred she be seen or at least discussed with him, but at her insistance have called in ABX (Keflex) due to her mild sx's (albeit chronic sx's) and her concerns.  I have instructed her to be seen or call Dr Georgianne Fick Monday.  Barnett Applebaum, MD, [facility], [facility] 04/06/2020  4:13 PM

## 2020-04-06 NOTE — Progress Notes (Signed)
Persistent urinary tract infection. Will do treatment with macrobid 100mg  twice daily for 10 days. After that, continue with macrobid 100mg  daily to prevent infection.

## 2020-04-08 NOTE — Telephone Encounter (Signed)
What do I need to do to get her endocrinology referral? She has severe hashimoto's thyroiditis.

## 2020-04-08 NOTE — Progress Notes (Signed)
.     Gynecology Annual Exam  PCP: Lavera Guise, MD  Chief Complaint:  Chief Complaint  Patient presents with  . Gynecologic Exam    History of Present Illness:Patient is a 54 y.o. G2P2002 presents for annual exam. The patient has no complaints today.   LMP: No LMP recorded. (Menstrual status: IUD). Amenorrhea on IUD   There is no notable family history of breast or ovarian cancer in her family.  The patient wears seatbelts: yes.   The patient has regular exercise: not asked.    The patient denies current symptoms of depression.     Ultrasound today reveals normal uterus and ovaries.  Thin endometrial stipe with IUD in proper position.  Review of Systems: Review of Systems  Constitutional: Negative.   Gastrointestinal: Positive for abdominal pain and nausea. Negative for blood in stool, constipation, diarrhea, heartburn, melena and vomiting.  Genitourinary: Negative.   Neurological: Negative.     Past Medical History:  Patient Active Problem List   Diagnosis Date Noted  . Elevated amylase 04/06/2020  . Generalized abdominal pain 03/23/2020  . 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 Vitamin D deficiency 06/06/2019  . Peripheral vascular disease ([place]) 10/11/2018  . Folic acid deficiency [id number]  . Duodenal ulcer 10/11/2018  . Screening for breast cancer 10/11/2018  . Cellulitis of toe of left foot 06/07/2018  . Shortness of breath 06/07/2018  . Arthralgia of both knees 06/07/2018  . Decreased functional mobility 06/07/2018  . Iron deficiency anemia 06/24/2015  . Morbid obesity with BMI of 50.0-59.9, adult ([place]) 06/04/2013  . GERD (gastroesophageal reflux disease) 06/04/2013    Overview:  On ppi   . 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 Buerger's disease ([place]) 06/04/2013  . Sleep apnea 06/04/2013    Overview:  Prescribed CPAP but not able to  tolerate.   . Hypothyroid 05/11/2013  . Hypertension 05/11/2013    Overview:  On medications     Past Surgical History:  Past Surgical History:  Procedure Laterality Date  . ADENOIDECTOMY     Small child  . APPENDECTOMY     Age 5  . COLON SURGERY  1980   ileostomy and internal pouch age 36  . DILATATION & CURETTAGE/HYSTEROSCOPY WITH MYOSURE N/A 05/02/2015   Procedure: DILATATION & CURETTAGE/HYSTEROSCOPY WITH MYOSURE;  Surgeon: Malachy Mood, MD;  Location: [facility] ORS;  Service: Gynecology;  Laterality: N/A;  . LAPAROSCOPIC GASTRIC SLEEVE RESECTION  07/06/2016    Gynecologic History:  No LMP recorded. (Menstrual status: IUD). Last Pap: Results were: 05/18/2020 NIL HPV negative EMBx 05/18/2018 negative for hyperplasia or malignancy  Obstetric History: [id number]  Family History:  Family History  Problem Relation Age of Onset  . Hypertension Mother   . Congestive Heart Failure Father   . Hypertension Father   . Congestive Heart Failure Maternal Grandmother   . Colon cancer Maternal Grandfather 23  . Skin cancer Maternal Aunt        Basal cell  . Skin cancer Maternal Uncle        Basal cell  . Skin cancer Cousin        Basal Cell    Social History:  Social History   Socioeconomic History  . Marital status: Married    Spouse name: Not on file  . Number of children: Not on file  . Years of education: Not on file  .  Highest education level: Not on file  Occupational History  . Not on file  Tobacco Use  . Smoking status: Former Smoker    Types: Cigarettes    Quit date: 04/28/2010    Years since quitting: 9.9  . Smokeless tobacco: Never Used  Substance and Sexual Activity  . Alcohol use: No    Alcohol/week: 0.0 standard drinks  . Drug use: No  . Sexual activity: Yes    Birth control/protection: None  Other Topics Concern  . Not on file  Social History Narrative  . Not on file   Social Determinants of Health   Financial Resource Strain:   . Difficulty of  Paying Living Expenses:   Food Insecurity:   . Worried About Charity fundraiser in the Last Year:   . Arboriculturist in the Last Year:   Transportation Needs:   . Film/video editor (Medical):   Marland Kitchen Lack of Transportation (Non-Medical):   Physical Activity:   . Days of Exercise per Week:   . Minutes of Exercise per Session:   Stress:   . Feeling of Stress :   Social Connections:   . Frequency of Communication with Friends and Family:   . Frequency of Social Gatherings with Friends and Family:   . Attends Religious Services:   . Active Member of Clubs or Organizations:   . Attends Archivist Meetings:   Marland Kitchen Marital Status:   Intimate Partner Violence:   . Fear of Current or Ex-Partner:   . Emotionally Abused:   Marland Kitchen Physically Abused:   . Sexually Abused:     Allergies:  Allergies  Allergen Reactions  . Tape Rash    Some tapes cause a rash, possible the paper tape.    Medications: Prior to Admission medications   Medication Sig Start Date End Date Taking? Authorizing Provider  amLODipine (NORVASC) 10 MG tablet Take 1 tablet (10 mg total) by mouth at bedtime. 10/23/19   Lavera Guise, MD  aspirin EC 81 MG tablet Take 1 tablet (81 mg total) by mouth daily. 11/16/19   Ronnell Freshwater, NP  cephALEXin (KEFLEX) 500 MG capsule Take 1 capsule (500 mg total) by mouth 4 (four) times daily. 04/06/20   Gae Dry, MD  famotidine (PEPCID) 20 MG tablet Take 1 tablet (20 mg total) by mouth 2 (two) times daily. 03/21/20   Ronnell Freshwater, NP  fluconazole (DIFLUCAN) 150 MG tablet Take 1 tablet po QOD for 5 doses. Repeat treatment if needed. 04/02/20   Ronnell Freshwater, NP  folic acid (FOLVITE) 1 MG tablet Take 1 tablet (1 mg total) by mouth daily. 10/23/19   Lavera Guise, MD  levonorgestrel (MIRENA, 52 MG,) 20 MCG/24HR IUD by Intrauterine route.    [provider]  levothyroxine (SYNTHROID) 200 MCG tablet take 200 mcg po six days a week and on saturdays take 1/2  tablet PO. 03/05/20   Boscia, Greer Ee, NP  Multiple Vitamins-Minerals (VITAMIN D3 COMPLETE PO) Take 1 capsule by mouth 2 (two) times a week.    [provider]  nitrofurantoin, macrocrystal-monohydrate, (MACROBID) 100 MG capsule Take 1 capsule po bid for 10 days then take 1 capsule po daily to prevent further infection. 04/06/20   Ronnell Freshwater, NP  pantoprazole (PROTONIX) 40 MG tablet Take 1 tablet (40 mg total) by mouth 2 (two) times daily. 10/23/19   Lavera Guise, MD  phenazopyridine (PYRIDIUM) 200 MG tablet Take 1 tablet (200 mg  total) by mouth 3 (three) times daily as needed for pain. 10/30/19   Ronnell Freshwater, NP  Vitamin D, Ergocalciferol, (DRISDOL) 1.25 MG (50000 UT) CAPS capsule TAKE 1 CAPSULE ONCE A WEEK 06/01/19   Ronnell Freshwater, NP    Physical Exam Vitals: Height 5\' 7"  (1.702 m).  General: NAD, obese, appears stated age 33: normocephalic, anicteric Pulmonary: No increased work of breathing, CTAB Cardiovascular: RRR, distal pulses 2+ Abdomen: NABS, soft, non-tender, non-distended.     Genitourinary:  External: Normal external female genitalia.  Normal urethral meatus, normal Bartholin's and Skene's glands.    Vagina: Normal vaginal mucosa, no evidence of prolapse.    Cervix: Grossly normal in appearance, no bleeding  Uterus: Non-enlarged, mobile, normal contour.  No CMT  Adnexa: ovaries non-enlarged, no adnexal masses  Rectal: deferred  Lymphatic: no evidence of inguinal lymphadenopathy Extremities: no edema, erythema, or tenderness Neurologic: Grossly intact Psychiatric: mood appropriate, affect full  Female chaperone present for pelvic and breast  portions of the physical exam     Assessment: 54 y.o. G2P2002 routine annual exam  Plan: Problem List Items Addressed This Visit      Endocrine   Hypothyroid   Relevant Orders   Thyroid Panel With TSH (Completed)     Other   Generalized abdominal pain   Relevant Orders   Comprehensive metabolic  panel (Completed)   Amylase (Completed)   Lipase (Completed)   CBC (Completed)    Other Visit Diagnoses    Encounter for gynecological examination without abnormal finding    -  Primary   Screening for malignant neoplasm of cervix       Relevant Orders   Cytology - PAP (Completed)   Breast screening       Relevant Orders   MM 3D SCREEN BREAST BILATERAL   Elevated amylase       Relevant Orders   Amylase (Completed)   Lipase (Completed)   Urinary tract infection without hematuria, site unspecified       Relevant Orders   Urine Culture (Completed)   Vasomotor symptoms due to menopause       Relevant Orders   Follicle stimulating hormone (Completed)   Estradiol (Completed)      1) Mammogram - recommend yearly screening mammogram.  Mammogram Was ordered today  2) STI screening  was notoffered and declined  3) ASCCP guidelines and rational discussed.  Patient opts for every 3 years screening interval  4) Osteoporosis  - per USPTF routine screening DEXA at age 30  5) Routine healthcare maintenance including cholesterol, diabetes screening managed by PCP.  PCP had requested some additional labs and I told Ginnie we would draw them today instead of having her run by the hospital  6) IUD in proper location, we discussed that FDA indication has been extended to 6 years so given good response still no need for replacement this year.  7) Return in about 1 year (around 04/02/2021) for annual.    Malachy Mood, MD [facility], [facility] 04/08/2020, 10:56 PM

## 2020-04-08 NOTE — Telephone Encounter (Signed)
I don't know how to answer this

## 2020-04-09 NOTE — Telephone Encounter (Signed)
Can you answer her question?

## 2020-04-10 NOTE — Telephone Encounter (Signed)
She decided local?

## 2020-04-22 ENCOUNTER — Other Ambulatory Visit: Payer: Self-pay

## 2020-04-22 MED ORDER — ASPIRIN EC 81 MG PO TBEC: 81.0000 mg | DELAYED_RELEASE_TABLET | Freq: Every day | ORAL | 2 refills | Status: DC

## 2020-04-23 ENCOUNTER — Other Ambulatory Visit: Payer: Self-pay

## 2020-04-23 ENCOUNTER — Telehealth: Payer: Self-pay

## 2020-04-23 ENCOUNTER — Ambulatory Visit: Payer: 59

## 2020-04-23 NOTE — Telephone Encounter (Signed)
Pt was came info BP check due she had appt with endocrinology and she said her bp was high and we check BP here 138/76 and advised pt  That keep tract and if high need to been seen

## 2020-04-24 ENCOUNTER — Other Ambulatory Visit: Payer: Self-pay

## 2020-04-24 MED ORDER — ASPIRIN EC 81 MG PO TBEC: DELAYED_RELEASE_TABLET | ORAL | 2 refills | Status: DC

## 2020-04-25 ENCOUNTER — Encounter: Payer: Self-pay | Admitting: Nurse Practitioner

## 2020-04-25 ENCOUNTER — Ambulatory Visit: Payer: 59 | Admitting: Nurse Practitioner

## 2020-04-25 ENCOUNTER — Other Ambulatory Visit: Payer: Self-pay | Admitting: Nurse Practitioner

## 2020-04-25 VITALS — Resp 16 | Ht 67.0 in | Wt 352.0 lb

## 2020-04-25 DIAGNOSIS — Z9049 Acquired absence of other specified parts of digestive tract: Secondary | ICD-10-CM

## 2020-04-25 DIAGNOSIS — E538 Deficiency of other specified B group vitamins: Secondary | ICD-10-CM

## 2020-04-25 DIAGNOSIS — K51019 Ulcerative (chronic) pancolitis with unspecified complications: Secondary | ICD-10-CM

## 2020-04-25 DIAGNOSIS — R1084 Generalized abdominal pain: Secondary | ICD-10-CM | POA: Diagnosis not present

## 2020-04-25 DIAGNOSIS — E063 Autoimmune thyroiditis: Secondary | ICD-10-CM

## 2020-04-25 DIAGNOSIS — E038 Other specified hypothyroidism: Secondary | ICD-10-CM | POA: Diagnosis not present

## 2020-04-25 DIAGNOSIS — R748 Abnormal levels of other serum enzymes: Secondary | ICD-10-CM

## 2020-04-25 NOTE — Progress Notes (Signed)
Added b12, folate, and BMP to current lab patient.

## 2020-04-25 NOTE — Progress Notes (Signed)
[facility] [place], [place] [address]  Internal MEDICINE  Telephone Visit  Patient Name: [name]  [id number]  [id number]  Date of Service: 04/25/2020  I connected with the patient at 8:50am by webcam and verified the patients identity using two identifiers.   I discussed the limitations, risks, security and privacy concerns of performing an evaluation and management service by webcam and the availability of in person appointments. I also discussed with the patient that there may be a patient responsible charge related to the service.  The patient expressed understanding and agrees to proceed.    Chief Complaint  Patient presents with  . Telephone Screen    discuss labs  . Telephone Assessment  . Gastroesophageal Reflux  . Hypertension  . Follow-up    new diet came with multi vitamins etc wants to know if its ok to take,     .The patient has been contacted via webcam for follow up visit due to concerns for spread of novel coronavirus. The patient is continuing to have abdominal pain. This has improved sime since her last visit. She has started a new diet with protein shakes, nutritional supplements, and high protein, reduced calorie meals. She states that since starting this diet, the abdominal pain has improved some. Amylase level now 469. There is question as to whether or not she has presence of celiac disease, gluten sensitivity, or other food sensitivities which may be causing increased issues with her abdomen. She did see Dr. Gabriel Carina, endocrinology on Wednesday. Amylase level has started to drop since starting on this new diet.  The patient is very concerned about weight and how obesity is causing her to have comorbid health conditions. She is spending most of her time in bed. Difficult for her to even move around at all, let alone, get out of her home and go to any sort of appointments or anything outside of the home. She has already had gastric sleeve  surgery. She initially lost 88 pounds, but then weight loss slowed and she eventually gained the weight. As an adolescent, the patient had significant ulcerative colitis. she had BCIR pouch surgery to remove effected colon. She should have referral to GI provider. She has found one at [facility] who specializes in the surgery she had in the past. She will also have upcoming appointment with nutritionist. She does have initial appointment with the GI surgeon at [facility] on June 2. This is the first available appointment.        Current Medication: Outpatient Encounter Medications as of 04/25/2020  Medication Sig  . amLODipine (NORVASC) 10 MG tablet Take 1 tablet (10 mg total) by mouth at bedtime.  Marland Kitchen aspirin EC 81 MG tablet Take 2 tab daily  . cephALEXin (KEFLEX) 500 MG capsule Take 1 capsule (500 mg total) by mouth 4 (four) times daily.  . famotidine (PEPCID) 20 MG tablet Take 1 tablet (20 mg total) by mouth 2 (two) times daily.  . fluconazole (DIFLUCAN) 150 MG tablet Take 1 tablet po QOD for 5 doses. Repeat treatment if needed.  . folic acid (FOLVITE) 1 MG tablet Take 1 tablet (1 mg total) by mouth daily.  Marland Kitchen levonorgestrel (MIRENA, 52 MG,) 20 MCG/24HR IUD by Intrauterine route.  Marland Kitchen levothyroxine (SYNTHROID) 200 MCG tablet take 200 mcg po six days a week and on saturdays take 1/2 tablet PO.  . Multiple Vitamins-Minerals (VITAMIN D3 COMPLETE PO) Take 1 capsule by mouth 2 (two) times a  week.  . nitrofurantoin, macrocrystal-monohydrate, (MACROBID) 100 MG capsule Take 1 capsule po bid for 10 days then take 1 capsule po daily to prevent further infection.  . pantoprazole (PROTONIX) 40 MG tablet Take 1 tablet (40 mg total) by mouth 2 (two) times daily.  . phenazopyridine (PYRIDIUM) 200 MG tablet Take 1 tablet (200 mg total) by mouth 3 (three) times daily as needed for pain.  . Vitamin D, Ergocalciferol, (DRISDOL) 1.25 MG (50000 UT) CAPS capsule TAKE 1 CAPSULE ONCE A WEEK   No  facility-administered encounter medications on file as of 04/25/2020.    Surgical History: Past Surgical History:  Procedure Laterality Date  . ADENOIDECTOMY     Small child  . APPENDECTOMY     Age 25  . COLON SURGERY  1980   ileostomy and internal pouch age 35  . DILATATION & CURETTAGE/HYSTEROSCOPY WITH MYOSURE N/A 05/02/2015   Procedure: DILATATION & CURETTAGE/HYSTEROSCOPY WITH MYOSURE;  Surgeon: Malachy Mood, MD;  Location: [facility] ORS;  Service: Gynecology;  Laterality: N/A;  . LAPAROSCOPIC GASTRIC SLEEVE RESECTION  07/06/2016    Medical History: Past Medical History:  Diagnosis Date  . Berger's disease 2011  . Family history of adverse reaction to anesthesia    sister and daughter difficult to wake up  . Folic acid deficiency   . GERD (gastroesophageal reflux disease)   . Hypertension   . Hypothyroidism   . IDA (iron deficiency anemia)   . Thrombocytosis ([place])     Family History: Family History  Problem Relation Age of Onset  . Hypertension Mother   . Congestive Heart Failure Father   . Hypertension Father   . Congestive Heart Failure Maternal Grandmother   . Colon cancer Maternal Grandfather 64  . Skin cancer Maternal Aunt        Basal cell  . Skin cancer Maternal Uncle        Basal cell  . Skin cancer Cousin        Basal Cell    Social History   Socioeconomic History  . Marital status: Married    Spouse name: Not on file  . Number of children: Not on file  . Years of education: Not on file  . Highest education level: Not on file  Occupational History  . Not on file  Tobacco Use  . Smoking status: Former Smoker    Types: Cigarettes    Quit date: 04/28/2010    Years since quitting: 10.0  . Smokeless tobacco: Never Used  Substance and Sexual Activity  . Alcohol use: No    Alcohol/week: 0.0 standard drinks  . Drug use: No  . Sexual activity: Yes    Birth control/protection: None  Other Topics Concern  . Not on file  Social History Narrative  .  Not on file   Social Determinants of Health   Financial Resource Strain:   . Difficulty of Paying Living Expenses:   Food Insecurity:   . Worried About Charity fundraiser in the Last Year:   . Arboriculturist in the Last Year:   Transportation Needs:   . Film/video editor (Medical):   Marland Kitchen Lack of Transportation (Non-Medical):   Physical Activity:   . Days of Exercise per Week:   . Minutes of Exercise per Session:   Stress:   . Feeling of Stress :   Social Connections:   . Frequency of Communication with Friends and Family:   . Frequency of Social Gatherings with Friends and Family:   .  Attends Religious Services:   . Active Member of Clubs or Organizations:   . Attends Archivist Meetings:   Marland Kitchen Marital Status:   Intimate Partner Violence:   . Fear of Current or Ex-Partner:   . Emotionally Abused:   Marland Kitchen Physically Abused:   . Sexually Abused:       Review of Systems  Constitutional: Positive for activity change, fatigue and unexpected weight change. Negative for chills.       The patient continues to have weight gain which is significantly impacting her ability to participate in activities of daily living, and especially activities in which she enjoys. Recently started new diet with protein and nutritional supplements. Has started to feel slightly better.   HENT: Negative for congestion, postnasal drip, rhinorrhea, sneezing and sore throat.   Respiratory: Positive for shortness of breath. Negative for cough and chest tightness.        There is shortness of breath with exertion.  Cardiovascular: Negative for chest pain and palpitations.  Gastrointestinal: Positive for abdominal pain. Negative for constipation, diarrhea, nausea and vomiting.       Has improved   Endocrine: Negative for cold intolerance, heat intolerance, polydipsia and polyuria.       Thyroid panel recently checked and already showing improvement after increase in levothyroxine dose at most recent  check. Recently saw endocrinology provider. No changes made to thyroid medication.   Musculoskeletal: Positive for arthralgias, back pain and myalgias. Negative for joint swelling and neck pain.  Skin: Negative for rash.  Neurological: Negative for dizziness, tremors, numbness and headaches.  Hematological: Negative for adenopathy. Does not bruise/bleed easily.  Psychiatric/Behavioral: Negative for behavioral problems (Depression), sleep disturbance and suicidal ideas. The patient is nervous/anxious.     Today's Vitals   04/25/20 0839  Resp: 16  Weight: (!) 352 lb (159.7 kg)  Height: 5\' 7"  (1.702 m)   Body mass index is 55.13 kg/m.  Observation/Objective:   The patient is alert and oriented. She is pleasant and answers all questions appropriately. Breathing is non-labored. She is in no acute distress at this time.    Assessment/Plan: 1. Generalized abdominal pain Gradually improving. Check labs for further evaluation of celiac disease and possible food sensitivities.  - Celiac Disease Panel - IgE Food Basic w/Component Rfx - Gluten Sensitivity Screen - Bowel Disorders Cascade  2. Elevated amylase Gradually improving. Check labs for further evaluation of celiac disease and possible food sensitivities.  - Celiac Disease Panel - IgE Food Basic w/Component Rfx - Gluten Sensitivity Screen - Bowel Disorders Cascade - Amylase  3. Chronic ulcerative enterocolitis with complication (HCC) Gradually improving. Check labs for further evaluation of celiac disease and possible food sensitivities.  - Celiac Disease Panel - IgE Food Basic w/Component Rfx - Gluten Sensitivity Screen - Bowel Disorders Cascade - ANA Direct w/Reflex if Positive  4. Hypothyroidism due to Hashimoto's thyroiditis Recheck thyroid panel  - Thyroid Panel With TSH  5. S/P total colectomy Keep initial patient appointment with surgeon on 05/16/2020  General Counseling: Sandy Salaam understanding of the  findings of today's phone visit and agrees with plan of treatment. I have discussed any further diagnostic evaluation that may be needed or ordered today. We also reviewed her medications today. she has been encouraged to call the office with any questions or concerns that should arise related to todays visit.  This patient was seen by Leretha Pol FNP Collaboration with Dr Lavera Guise as a part of collaborative care agreement  Orders  Placed This Encounter  Procedures  . Celiac Disease Panel  . IgE Food Basic w/Component Rfx  . Gluten Sensitivity Screen  . Bowel Disorders Cascade  . Thyroid Panel With TSH  . Amylase  . ANA Direct w/Reflex if Positive     Time spent: [facility]

## 2020-04-28 ENCOUNTER — Encounter: Payer: Self-pay | Admitting: Nurse Practitioner

## 2020-04-28 ENCOUNTER — Other Ambulatory Visit
Admission: RE | Admit: 2020-04-28 | Discharge: 2020-04-28 | Disposition: A | Discharge status: Home / Self Care | Payer: 59 | Source: Ambulatory Visit | Attending: Nurse Practitioner | Admitting: Nurse Practitioner

## 2020-04-28 ENCOUNTER — Ambulatory Visit
Admission: RE | Admit: 2020-04-28 | Discharge: 2020-04-28 | Disposition: A | Discharge status: Home / Self Care | Payer: 59 | Source: Ambulatory Visit | Attending: Obstetrics and Gynecology | Admitting: Obstetrics and Gynecology

## 2020-04-28 DIAGNOSIS — Z1239 Encounter for other screening for malignant neoplasm of breast: Secondary | ICD-10-CM | POA: Insufficient documentation

## 2020-04-28 LAB — BASIC METABOLIC PANEL
Anion gap: 12 (ref 5–15)
BUN: 14 mg/dL (ref 6–20)
CO2: 24 mmol/L (ref 22–32)
Calcium: 9.3 mg/dL (ref 8.9–10.3)
Chloride: 100 mmol/L (ref 98–111)
Creatinine, Ser: 0.87 mg/dL (ref 0.44–1.00)
GFR calc Af Amer: >60 mL/min (ref >60)
GFR calc non Af Amer: >60 mL/min (ref >60)
Glucose, Bld: 103 mg/dL — ABNORMAL HIGH (ref 70–99)
Potassium: 3.6 mmol/L (ref 3.5–5.1)
Sodium: 136 mmol/L (ref 135–145)

## 2020-04-28 LAB — VITAMIN B12: Vitamin B-12: 1471 pg/mL — ABNORMAL HIGH (ref 180–914)

## 2020-04-28 LAB — AMYLASE: Amylase: 717 U/L — ABNORMAL HIGH (ref 28–100)

## 2020-04-29 LAB — THYROID PANEL WITH TSH
Free Thyroxine Index: 3.7 (ref 1.2–4.9)
T3 Uptake Ratio: 32 % (ref 24–39)
T4, Total: 11.7 ug/dL (ref 4.5–12.0)
TSH: 3.96 u[IU]/mL (ref 0.450–4.500)

## 2020-04-29 LAB — FOLATE RBC
Folate, Hemolysate: 544 ng/mL
Folate, RBC: 1129 ng/mL (ref >498)
Hematocrit: 48.2 % — ABNORMAL HIGH (ref 34.0–46.6)

## 2020-04-29 LAB — ANA W/REFLEX IF POSITIVE: Anti Nuclear Antibody (ANA): NEGATIVE

## 2020-04-30 ENCOUNTER — Encounter: Payer: Self-pay | Admitting: Nurse Practitioner

## 2020-04-30 LAB — CELIAC DISEASE PANEL
Endomysial Ab, IgA: NEGATIVE
IgA: 739 mg/dL — ABNORMAL HIGH (ref 87–352)
Tissue Transglutaminase Ab, IgA: <2 U/mL (ref 0–3)

## 2020-05-01 ENCOUNTER — Other Ambulatory Visit: Payer: Self-pay

## 2020-05-01 ENCOUNTER — Encounter: Payer: Self-pay | Admitting: Internal Medicine

## 2020-05-01 LAB — MISC LABCORP TEST (SEND OUT)
Labcorp test code: 603857
Labcorp test code: 164085

## 2020-05-01 NOTE — Telephone Encounter (Signed)
Does patient have upcoming appointment?

## 2020-05-02 ENCOUNTER — Encounter: Payer: Self-pay | Admitting: Nurse Practitioner

## 2020-05-02 NOTE — Telephone Encounter (Signed)
Yes. Please. She is scheduled to see a GI surgeon at [facility] the first week of June. I think she expects we can fix everything before then.

## 2020-05-02 NOTE — Telephone Encounter (Signed)
Can we discuss her

## 2020-05-14 NOTE — Progress Notes (Signed)
Patient to see GI first week of June. Discussed labs, in depth with patient over telephone.

## 2020-06-04 ENCOUNTER — Other Ambulatory Visit: Payer: Self-pay

## 2020-06-04 DIAGNOSIS — E559 Vitamin D deficiency, unspecified: Secondary | ICD-10-CM

## 2020-06-04 MED ORDER — VITAMIN D (ERGOCALCIFEROL) 1.25 MG (50000 UNIT) PO CAPS: ORAL_CAPSULE | ORAL | 3 refills | Status: DC

## 2020-07-21 DIAGNOSIS — K219 Gastro-esophageal reflux disease without esophagitis: Secondary | ICD-10-CM | POA: Insufficient documentation

## 2020-07-28 ENCOUNTER — Other Ambulatory Visit: Payer: Self-pay

## 2020-07-28 ENCOUNTER — Other Ambulatory Visit: Payer: Self-pay | Admitting: Nurse Practitioner

## 2020-07-28 DIAGNOSIS — K269 Duodenal ulcer, unspecified as acute or chronic, without hemorrhage or perforation: Secondary | ICD-10-CM

## 2020-07-28 DIAGNOSIS — K219 Gastro-esophageal reflux disease without esophagitis: Secondary | ICD-10-CM

## 2020-07-28 MED ORDER — PANTOPRAZOLE SODIUM 40 MG PO TBEC: 40.0000 mg | DELAYED_RELEASE_TABLET | Freq: Two times a day (BID) | ORAL | 3 refills | Status: DC

## 2020-07-28 MED ORDER — FAMOTIDINE 20 MG PO TABS: 20.0000 mg | ORAL_TABLET | Freq: Two times a day (BID) | ORAL | 3 refills | Status: DC

## 2020-08-05 ENCOUNTER — Ambulatory Visit: Payer: 59 | Admitting: Hospice and Palliative Medicine

## 2020-08-08 ENCOUNTER — Encounter: Payer: Self-pay | Admitting: Nurse Practitioner

## 2020-08-08 ENCOUNTER — Ambulatory Visit (INDEPENDENT_AMBULATORY_CARE_PROVIDER_SITE_OTHER): Payer: 59 | Admitting: Nurse Practitioner

## 2020-08-08 ENCOUNTER — Other Ambulatory Visit: Payer: Self-pay

## 2020-08-08 VITALS — BP 132/65 | HR 82 | Temp 98.4°F | Resp 16 | Ht 67.0 in

## 2020-08-08 DIAGNOSIS — I743 Embolism and thrombosis of arteries of the lower extremities: Secondary | ICD-10-CM

## 2020-08-08 DIAGNOSIS — Z0001 Encounter for general adult medical examination with abnormal findings: Secondary | ICD-10-CM | POA: Diagnosis not present

## 2020-08-08 DIAGNOSIS — I731 Thromboangiitis obliterans [Buerger's disease]: Secondary | ICD-10-CM

## 2020-08-08 DIAGNOSIS — R079 Chest pain, unspecified: Secondary | ICD-10-CM

## 2020-08-08 DIAGNOSIS — E063 Autoimmune thyroiditis: Secondary | ICD-10-CM

## 2020-08-08 DIAGNOSIS — E038 Other specified hypothyroidism: Secondary | ICD-10-CM

## 2020-08-08 DIAGNOSIS — I744 Embolism and thrombosis of arteries of extremities, unspecified: Secondary | ICD-10-CM | POA: Diagnosis not present

## 2020-08-08 DIAGNOSIS — G4733 Obstructive sleep apnea (adult) (pediatric): Secondary | ICD-10-CM

## 2020-08-08 NOTE — Progress Notes (Signed)
[facility] [place], [place] [address]  Internal MEDICINE  Office Visit Note  Patient Name: [name]  [id number]  [id number]  Date of Service: 08/13/2020   Pt is here for routine health maintenance examination   Chief Complaint  Patient presents with  . Annual Exam    ball on finger, questions about buergers disease, sleep apnea  . Hypertension  . Quality Metric Gaps    HepC, HIV screening, TDAP     The patient is here for health maintenance exam.  -has seen GI provider, referred to her in the past. Going to do a breathing test to see if she has small bowel bacterial overgrowth. Was going to have upper GI, but was cancelled when she developed blue color to her hands. 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 Was sent to ER at [facility], where she had been hospitalized in the past. Is now seeing rheumatologist at [facility] who specializes in Buerger's disease. She had CTA of left upper extremity while in the ER. Results showed Focal occlusion of the distal ulnar artery with distal reconstitution from the palmar arches. There is opacification of all of the digital arteries. This may be embolic or related to the patient's reported vasculitis. She was started on clizistol 50mg  tablets daily. No further studies were ordered. She is not due to see him back for next 6 months. 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   They have recommended she get COVID 19 vaccine. She did get her first COVID 19 vaccine yesterday.  Patient has brought physical forms from her husband's employer. She needs to have routine, fasting labs done. Her waist circumference is 56.   Current Medication: Outpatient Encounter Medications as of 08/08/2020  Medication Sig  . amLODipine (NORVASC) 10 MG tablet Take 1 tablet (10 mg total) by mouth at bedtime.  Marland Kitchen aspirin EC 81 MG tablet Take 2 tab daily  . famotidine (PEPCID) 20 MG tablet Take 1 tablet (20 mg  total) by mouth 2 (two) times daily.  . folic acid (FOLVITE) 1 MG tablet Take 1 tablet (1 mg total) by mouth daily.  Marland Kitchen levonorgestrel (MIRENA, 52 MG,) 20 MCG/24HR IUD by Intrauterine route.  Marland Kitchen levothyroxine (SYNTHROID) 200 MCG tablet take 200 mcg po six days a week and on saturdays take 1/2 tablet PO.  . Multiple Vitamins-Minerals (VITAMIN D3 COMPLETE PO) Take 1 capsule by mouth 2 (two) times a week.  . pantoprazole (PROTONIX) 40 MG tablet Take 1 tablet (40 mg total) by mouth 2 (two) times daily.  . Vitamin D, Ergocalciferol, (DRISDOL) 1.25 MG (50000 UNIT) CAPS capsule TAKE 1 CAPSULE ONCE A WEEK  . [DISCONTINUED] cephALEXin (KEFLEX) 500 MG capsule Take 1 capsule (500 mg total) by mouth 4 (four) times daily. (Patient not taking: Reported on 08/12/2020)  . [DISCONTINUED] fluconazole (DIFLUCAN) 150 MG tablet Take 1 tablet po QOD for 5 doses. Repeat treatment if needed.  . [DISCONTINUED] nitrofurantoin, macrocrystal-monohydrate, (MACROBID) 100 MG capsule Take 1 capsule po bid for 10 days then take 1 capsule po daily to prevent further infection. (Patient not taking: Reported on 08/12/2020)  . [DISCONTINUED] phenazopyridine (PYRIDIUM) 200 MG tablet Take 1 tablet (200 mg total) by mouth 3 (three) times daily as needed for pain.   No facility-administered encounter medications on file as of 08/08/2020.    Surgical History: Past Surgical History:  Procedure Laterality Date  . ADENOIDECTOMY     Small  child  . APPENDECTOMY     Age 56  . COLON SURGERY  1980   ileostomy and internal pouch age 53  . DILATATION & CURETTAGE/HYSTEROSCOPY WITH MYOSURE N/A 05/02/2015   Procedure: DILATATION & CURETTAGE/HYSTEROSCOPY WITH MYOSURE;  Surgeon: Malachy Mood, MD;  Location: [facility] ORS;  Service: Gynecology;  Laterality: N/A;  . LAPAROSCOPIC GASTRIC SLEEVE RESECTION  07/06/2016    Medical History: Past Medical History:  Diagnosis Date  . Berger's disease 2011  . Buerger's disease ([place])   . Family history of  adverse reaction to anesthesia    sister and daughter difficult to wake up  . Folic acid deficiency   . GERD (gastroesophageal reflux disease)   . Hypertension   . Hypothyroidism   . IDA (iron deficiency anemia)   . Thrombocytosis ([place])     Family History: Family History  Problem Relation Age of Onset  . Hypertension Mother   . Congestive Heart Failure Father   . Hypertension Father   . Congestive Heart Failure Maternal Grandmother   . Colon cancer Maternal Grandfather 43  . Skin cancer Maternal Aunt        Basal cell  . Skin cancer Maternal Uncle        Basal cell  . Skin cancer Cousin        Basal Cell      Review of Systems  Constitutional: Positive for fatigue. Negative for activity change, chills and unexpected weight change.       Has started to feel slightly better.   HENT: Negative for congestion, postnasal drip, rhinorrhea, sneezing and sore throat.   Respiratory: Positive for shortness of breath. Negative for cough and chest tightness.        There is shortness of breath with exertion.  Cardiovascular: Positive for leg swelling. Negative for chest pain and palpitations.       Patient recently seen in the ER at [facility] due to blue discoloration of the hands and eet. This was especially bad in left hand.   Gastrointestinal: Negative for abdominal pain, constipation, diarrhea, nausea and vomiting.  Endocrine: Negative for cold intolerance, heat intolerance, polydipsia and polyuria.  Musculoskeletal: Positive for arthralgias, back pain and myalgias. Negative for joint swelling and neck pain.  Skin: Negative for rash.  Neurological: Negative for dizziness, tremors, numbness and headaches.  Hematological: Negative for adenopathy. Does not bruise/bleed easily.  Psychiatric/Behavioral: Negative for behavioral problems (Depression), sleep disturbance and suicidal ideas. The patient is nervous/anxious.     Today's Vitals   08/08/20 1349  BP: 132/65  Pulse: 82  Resp: 16   Temp: 98.4 F (36.9 C)  SpO2: 97%  Height: 5\' 7"  (1.702 m)   Body mass index is 55.13 kg/m.   Physical Exam Vitals and nursing note reviewed.  Constitutional:      General: She is not in acute distress.    Appearance: Normal appearance. She is well-developed. She is obese. She is not diaphoretic.  HENT:     Head: Normocephalic and atraumatic.     Nose: Nose normal.     Mouth/Throat:     Pharynx: No oropharyngeal exudate.  Eyes:     Pupils: Pupils are equal, round, and reactive to light.  Neck:     Thyroid: No thyromegaly.     Vascular: No JVD.     Trachea: No tracheal deviation.  Cardiovascular:     Rate and Rhythm: Normal rate and regular rhythm.     Heart sounds: Normal heart sounds. No murmur heard.  No friction rub. No gallop.      Comments: There is blue discoloration of the hands and fingers of the left hand and fingers. Present, to a lesser degree, on the right side. Pedal pulses are diminished, bilaterally.  Pulmonary:     Effort: Pulmonary effort is normal. No respiratory distress.     Breath sounds: Normal breath sounds. No wheezing or rales.  Chest:     Chest wall: No tenderness.  Abdominal:     General: Bowel sounds are normal.     Palpations: Abdomen is soft.     Tenderness: There is no abdominal tenderness.  Musculoskeletal:        General: Normal range of motion.     Cervical back: Normal range of motion and neck supple.     Comments: Patient in a wheelchair.   Lymphadenopathy:     Cervical: No cervical adenopathy.  Skin:    General: Skin is warm and dry.  Neurological:     Mental Status: She is alert and oriented to person, place, and time.     Cranial Nerves: No cranial nerve deficit.  Psychiatric:        Mood and Affect: Mood normal.        Behavior: Behavior normal.        Thought Content: Thought content normal.        Judgment: Judgment normal.      LABS: Recent Results (from the past 2160 hour(s))  UA/M w/rflx Culture, Routine      Status: Abnormal (Preliminary result)   Collection Time: 08/11/20  4:20 PM   Specimen: Urine   Urine  Result Value Ref Range   Specific Gravity, UA      >=1.030 (A) 1.005 - 1.030   pH, UA 5.5 5.0 - 7.5   Color, UA Yellow Yellow   Appearance Ur Clear Clear   Leukocytes,UA Trace (A) Negative   Protein,UA Negative Negative/Trace   Glucose, UA Negative Negative   Ketones, UA Negative Negative   RBC, UA Negative Negative   Bilirubin, UA Negative Negative   Urobilinogen, Ur 0.2 0.2 - 1.0 mg/dL   Nitrite, UA Negative Negative   Microscopic Examination See below:     Comment: Microscopic was indicated and was performed.   Urinalysis Reflex Comment     Comment: This specimen has reflexed to a Urine Culture.  Microscopic Examination     Status: Abnormal   Collection Time: 08/11/20  4:20 PM   Urine  Result Value Ref Range   WBC, UA 6-10 (A) 0 - 5 /hpf   RBC None seen 0 - 2 /hpf   Epithelial Cells (non renal) 0-10 0 - 10 /hpf   Casts None seen None seen /lpf   Bacteria, UA None seen None seen/Few  Urine Culture, Reflex     Status: None (Preliminary result)   Collection Time: 08/11/20  4:20 PM   Urine  Result Value Ref Range   Urine Culture, Routine WILL FOLLOW     .Assessment/Plan:  1. Encounter for general adult medical examination with abnormal findings Annual health maintenancee exam today. Order slip given to have routine, fasting labs drawn. Will complete physical form from patient's husband's employer and return to her when finished.   2. 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 Will get CTA of bilateral lower extremities for further evaluation. Will get results to her rheumatologist at [facility] when available.  - CT ANGIO LOWER EXT  BILAT W &/OR WO CONTRAST; Future  4. 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  Will get CTA of right upper extremity for further evaluation and get results to her rheumatologist at [facility].  - CT ANGIO UP EXTREM RIGHT W &/OR WO CONTRAST; Future  5. Obstructive sleep apnea Will get home sleep test for further evaluation. Refer to Dr. Devona Konig as indicated.  - Home sleep test  6. Chest pain, unspecified type CTA of left upper extremity showing occlusion of distal ulnar artery with multiple opacifications of the digital arteries. Will get CTA chest for further evaluation.  - CT Angio Chest W/Cm &/Or Wo Cm; Future  7. Hypothyroidism due to Hashimoto's thyroiditis Check thyroid panel and adjust levothyroxine dosing as indicated.   General Counseling: neeya prigmore understanding of the findings of todays visit and agrees with plan of treatment. I have discussed any further diagnostic evaluation that may be needed or ordered today. We also reviewed her medications today. she has been encouraged to call the office with any questions or concerns that should arise related to todays visit.    Counseling:  This patient was seen by Leretha Pol FNP Collaboration with Dr Lavera Guise as a part of collaborative care agreement  Orders Placed This Encounter  Procedures  . CT ANGIO LOWER EXT BILAT W &/OR WO CONTRAST  . CT ANGIO UP EXTREM RIGHT W &/OR WO CONTRAST  . CT Angio Chest W/Cm &/Or Wo Cm  . Home sleep test     Total time spent: 60 Minutes  Time spent includes review of chart, medications, test results, and follow up plan with the patient.     Lavera Guise, MD  Internal Medicine

## 2020-08-11 ENCOUNTER — Ambulatory Visit: Payer: 59

## 2020-08-11 DIAGNOSIS — R300 Dysuria: Secondary | ICD-10-CM

## 2020-08-12 ENCOUNTER — Emergency Department: Admission: EM | Admit: 2020-08-12 | Discharge: 2020-08-13 | Discharge status: Against Medical Advice | Payer: 59

## 2020-08-12 ENCOUNTER — Encounter: Payer: Self-pay | Admitting: Internal Medicine

## 2020-08-12 ENCOUNTER — Ambulatory Visit (INDEPENDENT_AMBULATORY_CARE_PROVIDER_SITE_OTHER): Payer: 59 | Admitting: Internal Medicine

## 2020-08-12 ENCOUNTER — Other Ambulatory Visit: Payer: Self-pay

## 2020-08-12 DIAGNOSIS — R002 Palpitations: Secondary | ICD-10-CM | POA: Diagnosis not present

## 2020-08-12 DIAGNOSIS — I731 Thromboangiitis obliterans [Buerger's disease]: Secondary | ICD-10-CM | POA: Diagnosis not present

## 2020-08-12 DIAGNOSIS — E038 Other specified hypothyroidism: Secondary | ICD-10-CM | POA: Diagnosis not present

## 2020-08-12 DIAGNOSIS — I10 Essential (primary) hypertension: Secondary | ICD-10-CM | POA: Diagnosis not present

## 2020-08-12 DIAGNOSIS — E063 Autoimmune thyroiditis: Secondary | ICD-10-CM

## 2020-08-12 DIAGNOSIS — G4733 Obstructive sleep apnea (adult) (pediatric): Secondary | ICD-10-CM

## 2020-08-12 NOTE — Progress Notes (Signed)
[facility] [place], [place] [address]  Internal MEDICINE  Telephone Visit  Patient Name: [name]  [id number]  [id number]  Date of Service: 08/19/2020  I connected with the patient at11:23 by telephone and verified the patients identity using two identifiers.   I discussed the limitations, risks, security and privacy concerns of performing an evaluation and management service by telephone and the availability of in person appointments. I also discussed with the patient that there may be a patient responsible charge related to the service.  The patient expressed understanding and agrees to proceed.    Chief Complaint  Patient presents with   Telephone Assessment   Telephone Screen   Hospitalization Follow-up    ER visit at [facility] for fast heart rate, elevated Bp (over 200)   Abnormal Lab    thyroid levels on labs done at ER was elevated    HPI Pt is connected via video for multiple complaints and concerns . Went to ED with palpitation, blood pressure and heart rate was elevated, free T4 is elevated as well. . C/o O2 sats dropping during the day and at night. . Buergers's disease followed by Rheumatology now. Pt is not very active due to this   Current Medication: Outpatient Encounter Medications as of 08/12/2020  Medication Sig   amLODipine (NORVASC) 10 MG tablet Take 1 tablet (10 mg total) by mouth at bedtime.   aspirin EC 81 MG tablet Take 2 tab daily   famotidine (PEPCID) 20 MG tablet Take 1 tablet (20 mg total) by mouth 2 (two) times daily.   folic acid (FOLVITE) 1 MG tablet Take 1 tablet (1 mg total) by mouth daily.   levonorgestrel (MIRENA, 52 MG,) 20 MCG/24HR IUD by Intrauterine route.   levothyroxine (SYNTHROID) 200 MCG tablet take 200 mcg po six days a week and on saturdays take 1/2 tablet PO.   Multiple Vitamins-Minerals (VITAMIN D3 COMPLETE PO) Take 1 capsule by mouth 2 (two) times a week.   pantoprazole (PROTONIX) 40 MG tablet  Take 1 tablet (40 mg total) by mouth 2 (two) times daily.   Vitamin D, Ergocalciferol, (DRISDOL) 1.25 MG (50000 UNIT) CAPS capsule TAKE 1 CAPSULE ONCE A WEEK   [DISCONTINUED] cephALEXin (KEFLEX) 500 MG capsule Take 1 capsule (500 mg total) by mouth 4 (four) times daily. (Patient not taking: Reported on 08/12/2020)   [DISCONTINUED] fluconazole (DIFLUCAN) 150 MG tablet Take 1 tablet po QOD for 5 doses. Repeat treatment if needed.   [DISCONTINUED] nitrofurantoin, macrocrystal-monohydrate, (MACROBID) 100 MG capsule Take 1 capsule po bid for 10 days then take 1 capsule po daily to prevent further infection. (Patient not taking: Reported on 08/12/2020)   [DISCONTINUED] phenazopyridine (PYRIDIUM) 200 MG tablet Take 1 tablet (200 mg total) by mouth 3 (three) times daily as needed for pain.   No facility-administered encounter medications on file as of 08/12/2020.    Surgical History: Past Surgical History:  Procedure Laterality Date   ADENOIDECTOMY     Small child   APPENDECTOMY     Age 35   COLON SURGERY  1980   ileostomy and internal pouch age 62   [place] N/A 05/02/2015   Procedure: [place];  Surgeon: Malachy Mood, MD;  Location: [facility] ORS;  Service: Gynecology;  Laterality: N/A;   LAPAROSCOPIC GASTRIC SLEEVE RESECTION  07/06/2016    Medical History: Past Medical History:  Diagnosis Date   Berger's disease 2011   Buerger's disease ([place])  Family history of adverse reaction to anesthesia    sister and daughter difficult to wake up   Folic acid deficiency    GERD (gastroesophageal reflux disease)    Hypertension    Hypothyroidism    IDA (iron deficiency anemia)    Thrombocytosis (HCC)     Family History: Family History  Problem Relation Age of Onset   Hypertension Mother    Congestive Heart Failure Father    Hypertension Father    Congestive Heart Failure Maternal Grandmother     Colon cancer Maternal Grandfather 85   Skin cancer Maternal Aunt        Basal cell   Skin cancer Maternal Uncle        Basal cell   Skin cancer Cousin        Basal Cell    Social History   Socioeconomic History   Marital status: Married    Spouse name: Not on file   Number of children: Not on file   Years of education: Not on file   Highest education level: Not on file  Occupational History   Not on file  Tobacco Use   Smoking status: Former Smoker    Types: Cigarettes    Quit date: 04/28/2010    Years since quitting: 10.3   Smokeless tobacco: Never Used  Substance and Sexual Activity   Alcohol use: No    Alcohol/week: 0.0 standard drinks   Drug use: No   Sexual activity: Yes    Birth control/protection: None  Other Topics Concern   Not on file  Social History Narrative   Not on file   Social Determinants of Health   Financial Resource Strain:    Difficulty of Paying Living Expenses: Not on file  Food Insecurity:    Worried About Charity fundraiser in the Last Year: Not on file   [employer] of Food in the Last Year: Not on file  Transportation Needs:    Lack of Transportation (Medical): Not on file   Lack of Transportation (Non-Medical): Not on file  Physical Activity:    Days of Exercise per Week: Not on file   Minutes of Exercise per Session: Not on file  Stress:    Feeling of Stress : Not on file  Social Connections:    Frequency of Communication with Friends and Family: Not on file   Frequency of Social Gatherings with Friends and Family: Not on file   Attends Religious Services: Not on file   Active Member of Clubs or Organizations: Not on file   Attends Archivist Meetings: Not on file   Marital Status: Not on file  Intimate Partner Violence:    Fear of Current or Ex-Partner: Not on file   Emotionally Abused: Not on file   Physically Abused: Not on file   Sexually Abused: Not on file    Review of  Systems  Constitutional: Negative.   Respiratory: Positive for shortness of breath. Negative for chest tightness.   Cardiovascular: Positive for palpitations. Negative for chest pain.  Gastrointestinal: Negative for anal bleeding.  Endocrine: Positive for cold intolerance.  Genitourinary: Negative.   Musculoskeletal: Positive for arthralgias and gait problem.  Skin: Positive for color change.  Allergic/Immunologic: Negative.   Psychiatric/Behavioral: Negative.     Vital Signs: Resp 16    Ht 5' 7.5" (1.715 m)    Wt (!) 340 lb (154.2 kg)    BMI 52.47 kg/m    Observation/Objective: Pt is in no NAD,  able to communicate well   Assessment/Plan: 1. Buerger's disease ([place]) Pt is just started on pletal, she is to continue on all meds as per Rheumatology, might need other vascular studies   2. Essential hypertension Monitor blood pressure at home continue Norvasc    3. Palpitation Might be due to elevtaed T4, will need echo   4. Hypothyroidism due to Hashimoto's thyroiditis Pt is instructed to hold her extra dose of 25 mcg ( she takes 200+ 25 mcg )   5. Obstructive sleep apnea - sleep study is ordered   General Counseling: Sandy Salaam understanding of the findings of today's phone visit and agrees with plan of treatment. I have discussed any further diagnostic evaluation that may be needed or ordered today. We also reviewed her medications today. she has been encouraged to call the office with any questions or concerns that should arise related to todays visit.    Orders Placed This Encounter  Procedures   PSG Sleep Study     Time spent:45 Minutes    Dr Lavera Guise Internal medicine

## 2020-08-12 NOTE — Progress Notes (Signed)
Waiting on urine culture results.

## 2020-08-13 ENCOUNTER — Other Ambulatory Visit: Payer: Self-pay | Admitting: Nurse Practitioner

## 2020-08-13 ENCOUNTER — Encounter: Payer: Self-pay | Admitting: Internal Medicine

## 2020-08-13 DIAGNOSIS — I744 Embolism and thrombosis of arteries of extremities, unspecified: Secondary | ICD-10-CM | POA: Insufficient documentation

## 2020-08-13 DIAGNOSIS — I743 Embolism and thrombosis of arteries of the lower extremities: Secondary | ICD-10-CM

## 2020-08-13 DIAGNOSIS — Z0001 Encounter for general adult medical examination with abnormal findings: Secondary | ICD-10-CM | POA: Insufficient documentation

## 2020-08-13 DIAGNOSIS — E063 Autoimmune thyroiditis: Secondary | ICD-10-CM | POA: Insufficient documentation

## 2020-08-13 DIAGNOSIS — R079 Chest pain, unspecified: Secondary | ICD-10-CM | POA: Insufficient documentation

## 2020-08-13 DIAGNOSIS — G4733 Obstructive sleep apnea (adult) (pediatric): Secondary | ICD-10-CM

## 2020-08-17 LAB — UA/M W/RFLX CULTURE, ROUTINE
Bilirubin, UA: NEGATIVE
Glucose, UA: NEGATIVE
Ketones, UA: NEGATIVE
Nitrite, UA: NEGATIVE
Protein,UA: NEGATIVE
RBC, UA: NEGATIVE
Specific Gravity, UA: >=1.03 — AB (ref 1.005–1.030)
Urobilinogen, Ur: 0.2 mg/dL (ref 0.2–1.0)
pH, UA: 5.5 (ref 5.0–7.5)

## 2020-08-17 LAB — MICROSCOPIC EXAMINATION
Bacteria, UA: NONE SEEN
Casts: NONE SEEN /lpf
RBC, Urine: NONE SEEN /hpf (ref 0–2)

## 2020-08-17 LAB — URINE CULTURE, REFLEX

## 2020-08-18 ENCOUNTER — Encounter: Payer: Self-pay | Admitting: Internal Medicine

## 2020-08-20 ENCOUNTER — Ambulatory Visit: Payer: 59 | Admitting: Internal Medicine

## 2020-08-20 ENCOUNTER — Other Ambulatory Visit: Payer: Self-pay

## 2020-08-20 ENCOUNTER — Other Ambulatory Visit: Payer: 59

## 2020-08-20 ENCOUNTER — Encounter: Payer: Self-pay | Admitting: Nurse Practitioner

## 2020-08-20 DIAGNOSIS — G4733 Obstructive sleep apnea (adult) (pediatric): Secondary | ICD-10-CM

## 2020-08-20 DIAGNOSIS — Z20822 Contact with and (suspected) exposure to covid-19: Secondary | ICD-10-CM

## 2020-08-21 ENCOUNTER — Telehealth: Payer: Self-pay

## 2020-08-21 NOTE — Telephone Encounter (Signed)
Confirmed and screened for 08-26-20 ov. 

## 2020-08-22 ENCOUNTER — Encounter: Payer: Self-pay | Admitting: Internal Medicine

## 2020-08-22 LAB — NOVEL CORONAVIRUS, NAA: SARS-CoV-2, NAA: NOT DETECTED

## 2020-08-22 LAB — SARS-COV-2, NAA 2 DAY TAT

## 2020-08-23 NOTE — Procedures (Signed)
[facility] [place], [place] [address]  Sleep Specialist: Allyne Gee, MD [facility] Sleep Study Interpretation  Patient Name: [name] Patient MR ENIDPO:[id number] DOB:December 28, 1965  Date of Study: 08/20/2020  Indications for study: sleep apnea  BMI: 52.4       Respiratory Data:  Total AHI: 20.7/h  Total Obstructive Apneas: 1  Total Central Apneas: 1  Total Mixed Apneas: 0  Total Hypopneas: 167  If the AHI is greater than 5 per hour patient qualifies for PAP evaluation  Oximetry Data:  Oxygen Desaturation Index: 27.9/h  Lowest Desaturation: 77%  Cardiac Data:  Minimum Heart Rate: 57  Maximum Heart Rate: 170   Impression / Diagnosis:  This apnea study indicates presence of moderate obstructive sleep apnea with an AHI of 20.7/h.  In addition there is severe oxygen desaturation with a low saturation of 77% and possible cardiac arrhythmia with the highest pulse rate of 170 noted this may also be artifact clinical correlation is recommended.  Recommend CPAP titration in lab due to arrhythmia  GENERAL Recommendations:  1.  Consider Auto PAP with pressure ranges 5-20 cmH20 with download, or facility based PAP Titration Study  2.  Consider PAP interface mask fitted for patient comfort, Heated Humidification & PAP compliance monitoring (1 month, 3 months & 12 months after PAP initiation)  3. Consider treatment with mandibular advancement splint (MAS) or referral to an ENT surgeon for modification to the upper airway if the patient prefers an alternate therapy or the PAP trial is unsuccessful  4. Sleep hygiene measures should be discussed with the patient  5. Behavioral therapy such as weight reduction or smoking cessation as appropriate for the patient  6. Advise patient against the use of alcohol or sedatives in so much as these substances can worsen excessive daytime sleepiness and respiratory disturbances of sleep  7. Advise  patient against participating in potentially dangerous activities while drowsy such as operating a motor vehicle, heavy equipment or power tools as it can put them and others in danger  8. Advise patient of the long term consequences of OSA if left untreated, need for treatment and close follow up  9. Clinical follow up as deemed necessary     This Level III home sleep study was performed using the [employer], a 4 channel screening device subject to limitations. Depending on actual total sleep time, not measured in this study, the AHI (sum of apneas and hypopneas/hr of sleep) and therefore the severity of sleep apnea may be underestimated. As with any single night study, including Level 1 attended PSG, severity of sleep apnea may also be underestimated due to the lack of supine and/or REM sleep.  The interpretation associated with this report is based on normal values and degrees of severity in accordance with AASM parameters and/or estimated from multiple sources in the literature for adults ages 36-80+. These may not agree with the displayed values. The patient's treating physician should use the interpretation and recommendations in conjunction with the overall clinical evaluation and treatment of the patient.  Some of the terminology used in this scored ApneaLink report was developed several years ago and may not always be in accordance with current nomenclature. This in no way affects the accuracy of the data or the reliability of the interpretation and recommendations.

## 2020-08-25 ENCOUNTER — Encounter: Payer: Self-pay | Admitting: Nurse Practitioner

## 2020-08-26 ENCOUNTER — Encounter: Payer: Self-pay | Admitting: Hospice and Palliative Medicine

## 2020-08-26 ENCOUNTER — Ambulatory Visit: Payer: 59 | Admitting: Hospice and Palliative Medicine

## 2020-08-26 ENCOUNTER — Other Ambulatory Visit: Payer: Self-pay

## 2020-08-26 ENCOUNTER — Telehealth: Payer: Self-pay

## 2020-08-26 VITALS — Resp 16 | Ht 67.5 in | Wt 340.0 lb

## 2020-08-26 DIAGNOSIS — R002 Palpitations: Secondary | ICD-10-CM

## 2020-08-26 DIAGNOSIS — G4733 Obstructive sleep apnea (adult) (pediatric): Secondary | ICD-10-CM

## 2020-08-26 NOTE — Telephone Encounter (Signed)
Faxed [employer] verification to [employer] at [phone]. Placed copy in fax.

## 2020-08-26 NOTE — Progress Notes (Signed)
[facility] [place], [place] [address]  Internal MEDICINE  Telephone Visit  Patient Name: [name]  [id number]  [id number]  Date of Service: 08/26/2020  I connected with the patient at 0920 by telephone and verified the patients identity using two identifiers.   I discussed the limitations, risks, security and privacy concerns of performing an evaluation and management service by telephone and the availability of in person appointments. I also discussed with the patient that there may be a patient responsible charge related to the service.  The patient expressed understanding and agrees to proceed.    Chief Complaint  Patient presents with   New Patient (Initial Visit)   Telephone Screen    video call   Telephone Assessment    [phone]    Hypertension   Quality Metric Gaps    Tda    HPI Patient is being seen today to establish care for pulmonary medicine She recently had a PSG, AHI 21, SpO2 minimum 77%, HR range 57-171 She was diagnosed with CPAP in the past but at that was unable to tolerate the full face mask and there were not alternatives at that time She has been struggling with her HR and breathing since her first COVID vaccination, this week she says she is feeling much better and back to her baseline She does feel anxious about being scheduled for her second vaccination on Thursday of this week   Current Medication: Outpatient Encounter Medications as of 08/26/2020  Medication Sig   amLODipine (NORVASC) 10 MG tablet Take 1 tablet (10 mg total) by mouth at bedtime.   aspirin EC 81 MG tablet Take 2 tab daily   famotidine (PEPCID) 20 MG tablet Take 1 tablet (20 mg total) by mouth 2 (two) times daily.   folic acid (FOLVITE) 1 MG tablet Take 1 tablet (1 mg total) by mouth daily.   levonorgestrel (MIRENA, 52 MG,) 20 MCG/24HR IUD by Intrauterine route.   levothyroxine (SYNTHROID) 200 MCG tablet take 200 mcg po six days a week and  on saturdays take 1/2 tablet PO.   Multiple Vitamins-Minerals (VITAMIN D3 COMPLETE PO) Take 1 capsule by mouth 2 (two) times a week.   pantoprazole (PROTONIX) 40 MG tablet Take 1 tablet (40 mg total) by mouth 2 (two) times daily.   Vitamin D, Ergocalciferol, (DRISDOL) 1.25 MG (50000 UNIT) CAPS capsule TAKE 1 CAPSULE ONCE A WEEK   No facility-administered encounter medications on file as of 08/26/2020.    Surgical History: Past Surgical History:  Procedure Laterality Date   ADENOIDECTOMY     Small child   APPENDECTOMY     Age 89   COLON SURGERY  1980   ileostomy and internal pouch age 65   [place] N/A 05/02/2015   Procedure: [place];  Surgeon: Malachy Mood, MD;  Location: [facility] ORS;  Service: Gynecology;  Laterality: N/A;   LAPAROSCOPIC GASTRIC SLEEVE RESECTION  07/06/2016    Medical History: Past Medical History:  Diagnosis Date   Berger's disease 2011   Buerger's disease ([place])    Family history of adverse reaction to anesthesia    sister and daughter difficult to wake up   Folic acid deficiency    GERD (gastroesophageal reflux disease)    Hypertension    Hypothyroidism    IDA (iron deficiency anemia)    Thrombocytosis (HCC)     Family History: Family History  Problem Relation Age of Onset   Hypertension Mother  Congestive Heart Failure Father    Hypertension Father    Congestive Heart Failure Maternal Grandmother    Colon cancer Maternal Grandfather 85   Skin cancer Maternal Aunt        Basal cell   Skin cancer Maternal Uncle        Basal cell   Skin cancer Cousin        Basal Cell    Social History   Socioeconomic History   Marital status: Married    Spouse name: Not on file   Number of children: Not on file   Years of education: Not on file   Highest education level: Not on file  Occupational History   Not on file  Tobacco Use   Smoking  status: Former Smoker    Types: Cigarettes    Quit date: 04/28/2010    Years since quitting: 10.3   Smokeless tobacco: Never Used  Substance and Sexual Activity   Alcohol use: No    Alcohol/week: 0.0 standard drinks   Drug use: No   Sexual activity: Yes    Birth control/protection: None  Other Topics Concern   Not on file  Social History Narrative   Not on file   Social Determinants of Health   Financial Resource Strain:    Difficulty of Paying Living Expenses: Not on file  Food Insecurity:    Worried About Charity fundraiser in the Last Year: Not on file   [employer] of Food in the Last Year: Not on file  Transportation Needs:    Lack of Transportation (Medical): Not on file   Lack of Transportation (Non-Medical): Not on file  Physical Activity:    Days of Exercise per Week: Not on file   Minutes of Exercise per Session: Not on file  Stress:    Feeling of Stress : Not on file  Social Connections:    Frequency of Communication with Friends and Family: Not on file   Frequency of Social Gatherings with Friends and Family: Not on file   Attends Religious Services: Not on file   Active Member of Clubs or Organizations: Not on file   Attends Archivist Meetings: Not on file   Marital Status: Not on file  Intimate Partner Violence:    Fear of Current or Ex-Partner: Not on file   Emotionally Abused: Not on file   Physically Abused: Not on file   Sexually Abused: Not on file   Review of Systems  Constitutional: Negative for chills, diaphoresis and fatigue.  HENT: Negative for ear pain, postnasal drip and sinus pressure.   Eyes: Negative for photophobia, discharge, redness, itching and visual disturbance.  Respiratory: Positive for shortness of breath. Negative for cough and wheezing.   Cardiovascular: Negative for chest pain, palpitations and leg swelling.  Gastrointestinal: Negative for abdominal pain, constipation, diarrhea, nausea and  vomiting.  Genitourinary: Negative for dysuria and flank pain.  Musculoskeletal: Positive for back pain and gait problem. Negative for arthralgias and neck pain.       Disabled   Skin: Negative for color change.  Allergic/Immunologic: Negative for environmental allergies and food allergies.  Neurological: Negative for dizziness and headaches.  Hematological: Does not bruise/bleed easily.  Psychiatric/Behavioral: Negative for agitation, behavioral problems (depression) and hallucinations.   Vital Signs: Resp 16    Ht 5' 7.5" (1.715 m)    Wt (!) 340 lb (154.2 kg)    BMI 52.47 kg/m    Observation/Objective: No acute distress noted.  Assessment/Plan:  1. OSA (obstructive sleep apnea) Due to PSG, AHI 21, will have her set up with autotitration CPAP. Will follow-up after CPAP set-up. She was unable to tolerate full face mask in the past, will need to be set up with nasal pillows. Due to disabilities unable to tolerate CPAP titration study. - For home use only DME continuous positive airway pressure (CPAP)  2. Palpitation Palpitations she experienced from first COVID19 vaccine has much improved this week. She is anxious about taking her second vaccination on Thursday due to the complications she has with the first dose. Discussed with her the benefits of the vaccine and potential side effects and when it is appropriate to be seen by emergency services. Encouraged her to call the office with any symptoms she experiences. Will contact her on Monday to check-in.  General Counseling: samanthan dugo understanding of the findings of today's phone visit and agrees with plan of treatment. I have discussed any further diagnostic evaluation that may be needed or ordered today. We also reviewed her medications today. she has been encouraged to call the office with any questions or concerns that should arise related to todays visit.    Orders Placed This Encounter  Procedures   For home use only DME  continuous positive airway pressure (CPAP)    Time spent: 30 Minutes Time spent includes review of chart, medications, test results and follow-up plan with the patient.  Tanna Furry Bea Duren AGNP-C Internal medicine

## 2020-08-26 NOTE — Telephone Encounter (Signed)
Ok. Second message says her husband was going to fax it.

## 2020-08-26 NOTE — Telephone Encounter (Signed)
Did we get [employer] paperwork for her?

## 2020-09-01 ENCOUNTER — Other Ambulatory Visit: Payer: Self-pay

## 2020-09-02 ENCOUNTER — Telehealth: Payer: Self-pay

## 2020-09-02 NOTE — Telephone Encounter (Signed)
Faxed lab order to [facility]

## 2020-09-03 ENCOUNTER — Other Ambulatory Visit: Payer: Self-pay

## 2020-09-03 ENCOUNTER — Ambulatory Visit: Admission: RE | Admit: 2020-09-03 | Payer: 59 | Source: Ambulatory Visit

## 2020-09-03 ENCOUNTER — Ambulatory Visit: Payer: 59

## 2020-09-03 ENCOUNTER — Ambulatory Visit
Admission: RE | Admit: 2020-09-03 | Discharge: 2020-09-03 | Disposition: A | Discharge status: Home / Self Care | Payer: 59 | Source: Ambulatory Visit | Attending: Nurse Practitioner | Admitting: Nurse Practitioner

## 2020-09-03 ENCOUNTER — Other Ambulatory Visit: Payer: 59

## 2020-09-03 ENCOUNTER — Telehealth: Payer: Self-pay

## 2020-09-03 ENCOUNTER — Other Ambulatory Visit
Admission: RE | Admit: 2020-09-03 | Discharge: 2020-09-03 | Disposition: A | Discharge status: Home / Self Care | Payer: 59 | Source: Home / Self Care | Attending: Hospice and Palliative Medicine | Admitting: Hospice and Palliative Medicine

## 2020-09-03 DIAGNOSIS — I743 Embolism and thrombosis of arteries of the lower extremities: Secondary | ICD-10-CM | POA: Insufficient documentation

## 2020-09-03 DIAGNOSIS — E756 Lipid storage disorder, unspecified: Secondary | ICD-10-CM | POA: Insufficient documentation

## 2020-09-03 DIAGNOSIS — I744 Embolism and thrombosis of arteries of extremities, unspecified: Secondary | ICD-10-CM | POA: Diagnosis not present

## 2020-09-03 DIAGNOSIS — D691 Qualitative platelet defects: Secondary | ICD-10-CM | POA: Insufficient documentation

## 2020-09-03 LAB — CBC
HCT: 44.7 % (ref 36.0–46.0)
Hemoglobin: 14.9 g/dL (ref 12.0–15.0)
MCH: 31.1 pg (ref 26.0–34.0)
MCHC: 33.3 g/dL (ref 30.0–36.0)
MCV: 93.3 fL (ref 80.0–100.0)
Platelets: 312 10*3/uL (ref 150–400)
RBC: 4.79 MIL/uL (ref 3.87–5.11)
RDW: 14.9 % (ref 11.5–15.5)
WBC: 12.4 10*3/uL — ABNORMAL HIGH (ref 4.0–10.5)
nRBC: 0 % (ref 0.0–0.2)

## 2020-09-03 LAB — LIPID PANEL
Cholesterol: 142 mg/dL (ref 0–200)
HDL: 51 mg/dL (ref >40)
LDL Cholesterol: 72 mg/dL (ref 0–99)
Total CHOL/HDL Ratio: 2.8 RATIO
Triglycerides: 97 mg/dL (ref <150)
VLDL: 19 mg/dL (ref 0–40)

## 2020-09-03 LAB — POCT I-STAT CREATININE: Creatinine, Ser: 0.7 mg/dL (ref 0.44–1.00)

## 2020-09-03 MED ORDER — IOHEXOL 350 MG/ML SOLN
150.0000 mL | Freq: Once | INTRAVENOUS | Status: AC | PRN
  Administered 2020-09-03: 150 mL via INTRAVENOUS

## 2020-09-03 NOTE — Telephone Encounter (Signed)
Gave [facility] orders for apap set up. [name]

## 2020-09-05 ENCOUNTER — Encounter: Payer: Self-pay | Admitting: Hospice and Palliative Medicine

## 2020-09-09 ENCOUNTER — Ambulatory Visit: Payer: 59 | Admitting: Nurse Practitioner

## 2020-09-09 ENCOUNTER — Ambulatory Visit: Payer: 59 | Admitting: Internal Medicine

## 2020-09-10 ENCOUNTER — Ambulatory Visit
Admission: RE | Admit: 2020-09-10 | Discharge: 2020-09-10 | Disposition: A | Discharge status: Home / Self Care | Payer: 59 | Source: Ambulatory Visit | Attending: Nurse Practitioner | Admitting: Nurse Practitioner

## 2020-09-10 ENCOUNTER — Encounter: Payer: Self-pay | Admitting: Internal Medicine

## 2020-09-10 ENCOUNTER — Other Ambulatory Visit: Payer: Self-pay

## 2020-09-10 DIAGNOSIS — R079 Chest pain, unspecified: Secondary | ICD-10-CM | POA: Diagnosis present

## 2020-09-10 MED ORDER — IOHEXOL 350 MG/ML SOLN
75.0000 mL | Freq: Once | INTRAVENOUS | Status: AC | PRN
  Administered 2020-09-10: 75 mL via INTRAVENOUS

## 2020-09-11 ENCOUNTER — Encounter: Payer: Self-pay | Admitting: Hospice and Palliative Medicine

## 2020-09-11 ENCOUNTER — Telehealth: Payer: Self-pay

## 2020-09-11 NOTE — Progress Notes (Signed)
reviewed

## 2020-09-11 NOTE — Telephone Encounter (Signed)
Please see this

## 2020-09-11 NOTE — Progress Notes (Signed)
Review at visit 09/12/2020

## 2020-09-11 NOTE — Progress Notes (Signed)
Reviewed. Discus results 09/12/2020

## 2020-09-11 NOTE — Telephone Encounter (Signed)
Faxed patient CMN to [facility]. [name]

## 2020-09-12 ENCOUNTER — Encounter: Payer: Self-pay | Admitting: Nurse Practitioner

## 2020-09-12 ENCOUNTER — Telehealth: Payer: Self-pay

## 2020-09-12 ENCOUNTER — Ambulatory Visit (INDEPENDENT_AMBULATORY_CARE_PROVIDER_SITE_OTHER): Payer: 59 | Admitting: Nurse Practitioner

## 2020-09-12 ENCOUNTER — Ambulatory Visit: Payer: 59 | Admitting: Nurse Practitioner

## 2020-09-12 VITALS — Resp 16 | Ht 67.5 in | Wt 340.0 lb

## 2020-09-12 DIAGNOSIS — I739 Peripheral vascular disease, unspecified: Secondary | ICD-10-CM

## 2020-09-12 DIAGNOSIS — E038 Other specified hypothyroidism: Secondary | ICD-10-CM | POA: Diagnosis not present

## 2020-09-12 DIAGNOSIS — E063 Autoimmune thyroiditis: Secondary | ICD-10-CM

## 2020-09-12 DIAGNOSIS — I731 Thromboangiitis obliterans [Buerger's disease]: Secondary | ICD-10-CM

## 2020-09-12 DIAGNOSIS — R1084 Generalized abdominal pain: Secondary | ICD-10-CM

## 2020-09-12 NOTE — Progress Notes (Addendum)
[facility] [place], [place] [address]  Internal MEDICINE  Telephone Visit  Patient Name: [name]  [id number]  [id number]  Date of Service: 10/04/2020  I connected with the patient at 12:44pm by webcam and verified the patients identity using two identifiers.   I discussed the limitations, risks, security and privacy concerns of performing an evaluation and management service by webcam and the availability of in person appointments. I also discussed with the patient that there may be a patient responsible charge related to the service.  The patient expressed understanding and agrees to proceed.    Chief Complaint  Patient presents with   Follow-up    skin ,   Gastroesophageal Reflux   Hypertension   Shortness of Breath    copd    The patient has been contacted via webcam for follow up visit due to concerns for spread of novel coronavirus. The patient has had several CT angiograms since her last visit due to Buerger's disease. CTA of abdominal vessels shows no atherosclerotic changes and no arterial abnormalities of the abdominal vasculature. There was note of area of invagination of abdominal fat without acute findings. CTA of the chest showed no evidence of pulmonary embolism but did show mosaic pattern in the lungs, likely related to inflammatory changes from recent infection with COVID 19. CT of the right upper extremity showed no evidence of stenosis or occlusion in the vasculature of the arm. Study only diagnostic to level of the elbow. The patient continues to have pain and discoloration of her hands and fingers. She has a blue tinged knot noted to the middle finger. She is currently seeing a rheumatologist for treatmentevaluation of Beurger's disease. She is also seeing GI specialist for chronic abdominal pain.  The patient is in need of oversized, manual wheelchair. Due to the Buerger's disease, her ability to walk and perform activities of daily  living are impaired. A walker will not resolve these issues which are limiting her ability to participate in activities of daily living. The heavy duty, oversized wheelchair will give the patient the ability to safely perform daily activities. The patient and her family members are safely able to propel the wheelchair inside the home.       Current Medication: Outpatient Encounter Medications as of 09/12/2020  Medication Sig   amLODipine (NORVASC) 10 MG tablet Take 1 tablet (10 mg total) by mouth at bedtime.   cilostazol (PLETAL) 50 MG tablet Take 50 mg by mouth 2 (two) times daily.   famotidine (PEPCID) 20 MG tablet Take 1 tablet (20 mg total) by mouth 2 (two) times daily.   folic acid (FOLVITE) 1 MG tablet Take 1 tablet (1 mg total) by mouth daily.   levonorgestrel (MIRENA, 52 MG,) 20 MCG/24HR IUD by Intrauterine route.   levothyroxine (SYNTHROID) 200 MCG tablet take 200 mcg po six days a week and on saturdays take 1/2 tablet PO.   Multiple Vitamins-Minerals (VITAMIN D3 COMPLETE PO) Take 1 capsule by mouth 2 (two) times a week.   pantoprazole (PROTONIX) 40 MG tablet Take 1 tablet (40 mg total) by mouth 2 (two) times daily.   Vitamin D, Ergocalciferol, (DRISDOL) 1.25 MG (50000 UNIT) CAPS capsule TAKE 1 CAPSULE ONCE A WEEK   [DISCONTINUED] aspirin EC 81 MG tablet Take 2 tab daily   No facility-administered encounter medications on file as of 09/12/2020.    Surgical History: Past Surgical History:  Procedure Laterality Date   ADENOIDECTOMY     Small  child   APPENDECTOMY     Age 37   COLON SURGERY  1980   ileostomy and internal pouch age 39   [place] N/A 05/02/2015   Procedure: [place];  Surgeon: Malachy Mood, MD;  Location: [facility] ORS;  Service: Gynecology;  Laterality: N/A;   LAPAROSCOPIC GASTRIC SLEEVE RESECTION  07/06/2016    Medical History: Past Medical History:  Diagnosis Date    Berger's disease 2011   Buerger's disease ([place])    Family history of adverse reaction to anesthesia    sister and daughter difficult to wake up   Folic acid deficiency    GERD (gastroesophageal reflux disease)    Hypertension    Hypothyroidism    IDA (iron deficiency anemia)    Thrombocytosis     Family History: Family History  Problem Relation Age of Onset   Hypertension Mother    Congestive Heart Failure Father    Hypertension Father    Congestive Heart Failure Maternal Grandmother    Colon cancer Maternal Grandfather 85   Skin cancer Maternal Aunt        Basal cell   Skin cancer Maternal Uncle        Basal cell   Skin cancer Cousin        Basal Cell    Social History   Socioeconomic History   Marital status: Married    Spouse name: Not on file   Number of children: Not on file   Years of education: Not on file   Highest education level: Not on file  Occupational History   Not on file  Tobacco Use   Smoking status: Former Smoker    Types: Cigarettes    Quit date: 04/28/2010    Years since quitting: 10.4   Smokeless tobacco: Never Used  Substance and Sexual Activity   Alcohol use: No    Alcohol/week: 0.0 standard drinks   Drug use: No   Sexual activity: Yes    Birth control/protection: None  Other Topics Concern   Not on file  Social History Narrative   Not on file   Social Determinants of Health   Financial Resource Strain:    Difficulty of Paying Living Expenses: Not on file  Food Insecurity:    Worried About Charity fundraiser in the Last Year: Not on file   [employer] of Food in the Last Year: Not on file  Transportation Needs:    Lack of Transportation (Medical): Not on file   Lack of Transportation (Non-Medical): Not on file  Physical Activity:    Days of Exercise per Week: Not on file   Minutes of Exercise per Session: Not on file  Stress:    Feeling of Stress : Not on file  Social Connections:     Frequency of Communication with Friends and Family: Not on file   Frequency of Social Gatherings with Friends and Family: Not on file   Attends Religious Services: Not on file   Active Member of Clubs or Organizations: Not on file   Attends Archivist Meetings: Not on file   Marital Status: Not on file  Intimate Partner Violence:    Fear of Current or Ex-Partner: Not on file   Emotionally Abused: Not on file   Physically Abused: Not on file   Sexually Abused: Not on file      Review of Systems  Constitutional: Positive for fatigue. Negative for activity change, chills and unexpected weight  change.  HENT: Negative for congestion, postnasal drip, rhinorrhea, sneezing and sore throat.   Respiratory: Positive for shortness of breath. Negative for cough and chest tightness.        There is shortness of breath with exertion.  Cardiovascular: Positive for leg swelling. Negative for chest pain and palpitations.       Vascular spasms of vessels of arms, hands, legs, and feet. Gets blue discoloration to the extremities.   Gastrointestinal: Negative for abdominal pain, constipation, diarrhea, nausea and vomiting.  Endocrine: Negative for cold intolerance, heat intolerance, polydipsia and polyuria.  Musculoskeletal: Positive for arthralgias, back pain and myalgias. Negative for joint swelling and neck pain.  Skin: Negative for rash.  Neurological: Negative for dizziness, tremors, numbness and headaches.  Hematological: Negative for adenopathy. Does not bruise/bleed easily.  Psychiatric/Behavioral: Negative for behavioral problems (Depression), sleep disturbance and suicidal ideas. The patient is nervous/anxious.     Today's Vitals   09/12/20 1117  Resp: 16  Weight: (!) 340 lb (154.2 kg)  Height: 5' 7.5" (1.715 m)   Body mass index is 52.47 kg/m.  Observation/Objective:   The patient is alert and oriented. She is pleasant and answers all questions appropriately.  Breathing is non-labored. She is in no acute distress at this time. The patient sounds anxious over the phone .   Assessment/Plan: 1. Buerger's disease ([place]) Reviewed recent arterial studies with the patient. CTA of abdominal vessels shows no atherosclerotic changes and no arterial abnormalities of the abdominal vasculature. There was note of area of invagination of abdominal fat without acute findings. CTA of the chest showed no evidence of pulmonary embolism but did show mosaic pattern in the lungs, likely related to inflammatory changes from recent infection with COVID 19. CT of the right upper extremity showed no evidence of stenosis or occlusion in the vasculature of the arm. Recommend patient see vascular surgeon in addition to rheumatology for continued management of Buerger's disease.   2. Peripheral vascular disease ([place]) Reviewed recent arterial studies with the patient. CTA of abdominal vessels shows no atherosclerotic changes and no arterial abnormalities of the abdominal vasculature. There was note of area of invagination of abdominal fat without acute findings. CTA of the chest showed no evidence of pulmonary embolism but did show mosaic pattern in the lungs, likely related to inflammatory changes from recent infection with COVID 19. CT of the right upper extremity showed no evidence of stenosis or occlusion in the vasculature of the arm. Recommend patient see vascular surgeon in addition to rheumatology for continued management of Buerger's disease.   3. Hypothyroidism due to Hashimoto's thyroiditis Thyroid panel currently stable. Continue levothyroxine as prescribed   4. Generalized abdominal pain Patient should continue regular visits with GI provider as scheduled.   General Counseling: [name] understanding of the findings of today's phone visit and agrees with plan of treatment. I have discussed any further diagnostic evaluation that may be needed or ordered today. We also  reviewed her medications today. she has been encouraged to call the office with any questions or concerns that should arise related to todays visit.   This patient was seen by Leretha Pol FNP Collaboration with Dr Lavera Guise as a part of collaborative care agreement   Time spent: 15 Minutes    Dr Lavera Guise Internal medicine

## 2020-09-12 NOTE — Telephone Encounter (Signed)
Faxed [employer] to [facility]

## 2020-09-15 ENCOUNTER — Encounter: Payer: Self-pay | Admitting: Internal Medicine

## 2020-09-16 NOTE — Telephone Encounter (Signed)
Hey. If these were ordered per her providers at [facility], shouldn't she be seeing them for follow up and review?

## 2020-09-17 ENCOUNTER — Encounter: Payer: Self-pay | Admitting: Nurse Practitioner

## 2020-09-18 ENCOUNTER — Encounter: Payer: Self-pay | Admitting: Nurse Practitioner

## 2020-09-23 ENCOUNTER — Encounter: Payer: Self-pay | Admitting: Nurse Practitioner

## 2020-09-24 ENCOUNTER — Other Ambulatory Visit: Payer: Self-pay | Admitting: Nurse Practitioner

## 2020-09-24 DIAGNOSIS — I731 Thromboangiitis obliterans [Buerger's disease]: Secondary | ICD-10-CM

## 2020-09-24 DIAGNOSIS — I744 Embolism and thrombosis of arteries of extremities, unspecified: Secondary | ICD-10-CM

## 2020-09-24 NOTE — Progress Notes (Signed)
Referral to Dr. 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

## 2020-10-03 ENCOUNTER — Other Ambulatory Visit: Payer: Self-pay

## 2020-10-03 ENCOUNTER — Encounter: Payer: Self-pay | Admitting: Nurse Practitioner

## 2020-10-03 MED ORDER — ASPIRIN EC 81 MG PO TBEC: DELAYED_RELEASE_TABLET | ORAL | 2 refills | Status: DC

## 2020-10-06 ENCOUNTER — Other Ambulatory Visit: Payer: Self-pay

## 2020-10-06 ENCOUNTER — Telehealth: Payer: Self-pay

## 2020-10-06 DIAGNOSIS — R269 Unspecified abnormalities of gait and mobility: Secondary | ICD-10-CM

## 2020-10-06 DIAGNOSIS — R0602 Shortness of breath: Secondary | ICD-10-CM

## 2020-10-06 NOTE — Telephone Encounter (Signed)
Faxed order to [facility]

## 2020-10-07 ENCOUNTER — Encounter: Payer: Self-pay | Admitting: Nurse Practitioner

## 2020-10-08 ENCOUNTER — Encounter: Payer: Self-pay | Admitting: Internal Medicine

## 2020-10-08 ENCOUNTER — Encounter: Payer: Self-pay | Admitting: Nurse Practitioner

## 2020-10-08 NOTE — Telephone Encounter (Signed)
Can u please look into this

## 2020-10-08 NOTE — Telephone Encounter (Signed)
Do you know anything about this? 

## 2020-10-15 ENCOUNTER — Encounter: Payer: Self-pay | Admitting: Nurse Practitioner

## 2020-10-15 ENCOUNTER — Encounter: Payer: Self-pay | Admitting: Internal Medicine

## 2020-10-16 ENCOUNTER — Telehealth: Payer: Self-pay

## 2020-10-16 ENCOUNTER — Encounter: Payer: Self-pay | Admitting: Nurse Practitioner

## 2020-10-16 NOTE — Telephone Encounter (Signed)
lmom pt that she had new wheelchair or still waiting

## 2020-10-16 NOTE — Telephone Encounter (Signed)
Spoke with  [facility] that pt need wheelchair for her own that pt insurance only pay 80% and 20% they have to pay monthly for 13 months advised to check with her insurance and let us know what she like to do

## 2020-10-17 ENCOUNTER — Encounter: Payer: Self-pay | Admitting: Nurse Practitioner

## 2020-10-17 NOTE — Telephone Encounter (Signed)
Can we send wheelchair order to one of these places along with the note? thanks

## 2020-10-20 ENCOUNTER — Encounter: Payer: Self-pay | Admitting: Nurse Practitioner

## 2020-10-20 NOTE — Telephone Encounter (Signed)
Just FYI about her appointment tomorrow.

## 2020-10-21 ENCOUNTER — Encounter: Payer: Self-pay | Admitting: Nurse Practitioner

## 2020-10-21 ENCOUNTER — Ambulatory Visit (INDEPENDENT_AMBULATORY_CARE_PROVIDER_SITE_OTHER): Payer: 59 | Admitting: Vascular Surgery

## 2020-10-21 ENCOUNTER — Other Ambulatory Visit: Payer: Self-pay

## 2020-10-21 ENCOUNTER — Ambulatory Visit (INDEPENDENT_AMBULATORY_CARE_PROVIDER_SITE_OTHER): Payer: 59 | Admitting: Internal Medicine

## 2020-10-21 ENCOUNTER — Encounter: Payer: Self-pay | Admitting: Internal Medicine

## 2020-10-21 ENCOUNTER — Encounter: Payer: Self-pay | Admitting: Hospice and Palliative Medicine

## 2020-10-21 ENCOUNTER — Encounter (INDEPENDENT_AMBULATORY_CARE_PROVIDER_SITE_OTHER): Payer: Self-pay | Admitting: Vascular Surgery

## 2020-10-21 ENCOUNTER — Ambulatory Visit: Payer: 59 | Admitting: Internal Medicine

## 2020-10-21 VITALS — BP 116/77 | HR 90 | Ht 67.0 in | Wt 340.0 lb

## 2020-10-21 DIAGNOSIS — I10 Essential (primary) hypertension: Secondary | ICD-10-CM

## 2020-10-21 DIAGNOSIS — G4734 Idiopathic sleep related nonobstructive alveolar hypoventilation: Secondary | ICD-10-CM

## 2020-10-21 DIAGNOSIS — K219 Gastro-esophageal reflux disease without esophagitis: Secondary | ICD-10-CM | POA: Diagnosis not present

## 2020-10-21 DIAGNOSIS — I731 Thromboangiitis obliterans [Buerger's disease]: Secondary | ICD-10-CM

## 2020-10-21 DIAGNOSIS — G4733 Obstructive sleep apnea (adult) (pediatric): Secondary | ICD-10-CM | POA: Diagnosis not present

## 2020-10-21 DIAGNOSIS — R0902 Hypoxemia: Secondary | ICD-10-CM

## 2020-10-21 DIAGNOSIS — Z8616 Personal history of COVID-19: Secondary | ICD-10-CM

## 2020-10-21 DIAGNOSIS — Z9989 Dependence on other enabling machines and devices: Secondary | ICD-10-CM

## 2020-10-21 DIAGNOSIS — I744 Embolism and thrombosis of arteries of extremities, unspecified: Secondary | ICD-10-CM | POA: Diagnosis not present

## 2020-10-21 NOTE — Assessment & Plan Note (Signed)
blood pressure control important in reducing the progression of atherosclerotic disease. On appropriate oral medications.  

## 2020-10-21 NOTE — Assessment & Plan Note (Signed)
Apparently fairly severe.  She question whether or not this would be related to her Buerger's disease which would be exceedingly unlikely.

## 2020-10-21 NOTE — Progress Notes (Signed)
Patient ID: [name], female   DOB: 09-15-1966, 54 y.o.   MRN: [id number]  Chief Complaint  Patient presents with  . New Patient (Initial Visit)    Boscia beugers diease microvascular occlusions    HPI [name] is a 54 y.o. female.  I am asked to see the patient by H. Boscia for evaluation of Buerger's disease. I saw her over a decade ago and did an angiogram finding Buerger's disease. She quit smoking and largely did has done better since then.  She has gained significant weight. Last year, she had Covid and has developed some finger coolness and occasional cyanosis on both hands.  She has noticed this some on her toes as well.  She is very concerned because of her previous history of Buerger's.  These are occasionally painful.  She has a little nodule or area on her left index finger.  No open wounds or infection.  Strongly palpable radial and pedal pulses are present today.     Past Medical History:  Diagnosis Date  . Berger's disease 2011  . Buerger's disease ([place])   . Family history of adverse reaction to anesthesia    sister and daughter difficult to wake up  . Folic acid deficiency   . GERD (gastroesophageal reflux disease)   . Hypertension   . Hypothyroidism   . IDA (iron deficiency anemia)   . Thrombocytosis     Past Surgical History:  Procedure Laterality Date  . ADENOIDECTOMY     Small child  . APPENDECTOMY     Age 27  . COLON SURGERY  1980   ileostomy and internal pouch age 45  . DILATATION & CURETTAGE/HYSTEROSCOPY WITH MYOSURE N/A 05/02/2015   Procedure: DILATATION & CURETTAGE/HYSTEROSCOPY WITH MYOSURE;  Surgeon: Malachy Mood, MD;  Location: [facility] ORS;  Service: Gynecology;  Laterality: N/A;  . LAPAROSCOPIC GASTRIC SLEEVE RESECTION  07/06/2016     Family History  Problem Relation Age of Onset  . Hypertension Mother   . Congestive Heart Failure Father   . Hypertension Father   . Congestive Heart Failure Maternal Grandmother   . Colon cancer  Maternal Grandfather 16  . Skin cancer Maternal Aunt        Basal cell  . Skin cancer Maternal Uncle        Basal cell  . Skin cancer Cousin        Basal Cell      Social History   Tobacco Use  . Smoking status: Former Smoker    Types: Cigarettes    Quit date: 04/28/2010    Years since quitting: 10.4  . Smokeless tobacco: Never Used  Substance Use Topics  . Alcohol use: No    Alcohol/week: 0.0 standard drinks  . Drug use: No     Allergies  Allergen Reactions  . Tape Rash    Some tapes cause a rash, possible the paper tape.    Current Outpatient Medications  Medication Sig Dispense Refill  . amLODipine (NORVASC) 10 MG tablet Take 1 tablet (10 mg total) by mouth at bedtime. 90 tablet 3  . cilostazol (PLETAL) 50 MG tablet Take 50 mg by mouth 2 (two) times daily.    . Cranberry 1000 MG CAPS Take 1 tablet by mouth daily.    . cyanocobalamin 1000 MCG tablet Take by mouth.    . famotidine (PEPCID) 20 MG tablet Take 1 tablet (20 mg total) by mouth 2 (two) times daily. 60 tablet 3  . folic  acid (FOLVITE) 1 MG tablet Take 1 tablet (1 mg total) by mouth daily. 90 tablet 3  . levonorgestrel (MIRENA, 52 MG,) 20 MCG/24HR IUD by Intrauterine route.    Marland Kitchen levothyroxine (SYNTHROID) 200 MCG tablet take 200 mcg po six days a week and on saturdays take 1/2 tablet PO. 90 tablet 3  . Multiple Vitamins-Minerals (VITAMIN D3 COMPLETE PO) Take 1 capsule by mouth 2 (two) times a week.    . pantoprazole (PROTONIX) 40 MG tablet Take 1 tablet (40 mg total) by mouth 2 (two) times daily. 180 tablet 3  . Vitamin D, Ergocalciferol, (DRISDOL) 1.25 MG (50000 UNIT) CAPS capsule TAKE 1 CAPSULE ONCE A WEEK 12 capsule 3   No current facility-administered medications for this visit.      REVIEW OF SYSTEMS (Negative unless checked)  Constitutional: [] Weight loss  [] Fever  [] Chills Cardiac: [] Chest pain   [] Chest pressure   [] Palpitations   [] Shortness of breath when laying flat   [] Shortness of breath at  rest   [] Shortness of breath with exertion. Vascular:  [x] Pain in legs with walking   [x] Pain in legs at rest   [x] Pain in legs when laying flat   [] Claudication   [] Pain in feet when walking  [] Pain in feet at rest  [] Pain in feet when laying flat   [] History of DVT   [] Phlebitis   [x] Swelling in legs   [] Varicose veins   [] Non-healing ulcers Pulmonary:   [] Uses home oxygen   [] Productive cough   [] Hemoptysis   [] Wheeze  [] COPD   [] Asthma Neurologic:  [] Dizziness  [] Blackouts   [] Seizures   [] History of stroke   [] History of TIA  [] Aphasia   [] Temporary blindness   [] Dysphagia   [] Weakness or numbness in arms   [x] Weakness or numbness in legs Musculoskeletal:  [x] Arthritis   [] Joint swelling   [] Joint pain   [] Low back pain Hematologic:  [] Easy bruising  [] Easy bleeding   [] Hypercoagulable state   [x] Anemic  [] Hepatitis Gastrointestinal:  [] Blood in stool   [] Vomiting blood  [x] Gastroesophageal reflux/heartburn   [] Abdominal pain Genitourinary:  [] Chronic kidney disease   [] Difficult urination  [] Frequent urination  [] Burning with urination   [] Hematuria Skin:  [] Rashes   [] Ulcers   [] Wounds Psychological:  [] History of anxiety   []  History of major depression.    Physical Exam BP 116/77   Pulse 90   Ht 5\' 7"  (1.702 m)   Wt (!) 340 lb (154.2 kg)   BMI 53.25 kg/m  Gen:  WD/WN, NAD. obese Head: /AT, No temporalis wasting. Ear/Nose/Throat: Hearing grossly intact, nares w/o erythema or drainage, oropharynx w/o Erythema/Exudate Eyes: Conjunctiva clear, sclera non-icteric  Neck: trachea midline.  No JVD.  Pulmonary:  Good air movement, respirations not labored, no use of accessory muscles  Cardiac: RRR, no JVD Vascular:  Vessel Right Left  Radial  2+ palpable  2+ palpable                          DP  2+  2+  PT  1+  1+   Gastrointestinal:. No masses, surgical incisions, or scars. Musculoskeletal: M/S 5/5 throughout.  Extremities without ischemic changes.  No deformity or atrophy.   In a wheelchair.  1+ bilateral lower extremity edema. Neurologic: Sensation grossly intact in extremities.  Symmetrical.  Speech is fluent. Motor exam as listed above. Psychiatric: Judgment intact, Mood & affect appropriate for pt's clinical situation. Dermatologic: No rashes or ulcers noted.  No  cellulitis or open wounds.    Radiology No results found.  Labs Recent Results (from the past 2160 hour(s))  UA/M w/rflx Culture, Routine     Status: Abnormal   Collection Time: 08/11/20  4:20 PM   Specimen: Urine   Urine  Result Value Ref Range   Specific Gravity, UA      >=1.030 (A) 1.005 - 1.030   pH, UA 5.5 5.0 - 7.5   Color, UA Yellow Yellow   Appearance Ur Clear Clear   Leukocytes,UA Trace (A) Negative   Protein,UA Negative Negative/Trace   Glucose, UA Negative Negative   Ketones, UA Negative Negative   RBC, UA Negative Negative   Bilirubin, UA Negative Negative   Urobilinogen, Ur 0.2 0.2 - 1.0 mg/dL   Nitrite, UA Negative Negative   Microscopic Examination See below:     Comment: Microscopic was indicated and was performed.   Urinalysis Reflex Comment     Comment: This specimen has reflexed to a Urine Culture.  Microscopic Examination     Status: Abnormal   Collection Time: 08/11/20  4:20 PM   Urine  Result Value Ref Range   WBC, UA 6-10 (A) 0 - 5 /hpf   RBC None seen 0 - 2 /hpf   Epithelial Cells (non renal) 0-10 0 - 10 /hpf   Casts None seen None seen /lpf   Bacteria, UA None seen None seen/Few  Urine Culture, Reflex     Status: Abnormal   Collection Time: 08/11/20  4:20 PM   Urine  Result Value Ref Range   Urine Culture, Routine Final report (A)    Organism ID, Bacteria Enterococcus faecalis (A)     Comment: 25,000-50,000 colony forming units per mL   Antimicrobial Susceptibility Comment     Comment:       ** S = Susceptible; I = Intermediate; R = Resistant **                    P = Positive; N = Negative             MICS are expressed in micrograms per mL     Antibiotic                 RSLT#1    RSLT#2    RSLT#3    RSLT#4 Ciprofloxacin                  S Levofloxacin                   S Nitrofurantoin                 S Penicillin                     S Tetracycline                   S Vancomycin                     S   Novel Coronavirus, NAA ([facility])     Status: None   Collection Time: 08/20/20  3:51 PM   Specimen: Nasopharyngeal(NP) swabs in vial transport medium   Nasopharynge  Screenin  Result Value Ref Range   SARS-CoV-2, NAA Not Detected Not Detected    Comment: This nucleic acid amplification test was developed and its performance characteristics determined by [employer]. Nucleic acid amplification tests include RT-PCR and TMA. This test has not  been FDA cleared or approved. This test has been authorized by FDA under an Emergency Use Authorization (EUA). This test is only authorized for the duration of time the declaration that circumstances exist justifying the authorization of the emergency use of in vitro diagnostic tests for detection of SARS-CoV-2 virus and/or diagnosis of COVID-19 infection under section 564(b)(1) of the Act, 21 U.S.C. 330QTM-2(U) (1), unless the authorization is terminated or revoked sooner. When diagnostic testing is negative, the possibility of a false negative result should be considered in the context of a patient's recent exposures and the presence of clinical signs and symptoms consistent with COVID-19. An individual without symptoms of COVID-19 and who is not shedding SARS-CoV-2 virus wo uld expect to have a negative (not detected) result in this assay.   SARS-COV-2, NAA 2 DAY TAT     Status: None   Collection Time: 08/20/20  3:51 PM   Nasopharynge  Screenin  Result Value Ref Range   SARS-CoV-2, NAA 2 DAY TAT Performed   CBC     Status: Abnormal   Collection Time: 09/03/20 10:38 AM  Result Value Ref Range   WBC 12.4 (H) 4.0 - 10.5 K/uL   RBC 4.79 3.87 - 5.11 MIL/uL   Hemoglobin 14.9  12.0 - 15.0 g/dL   HCT 44.7 36 - 46 %   MCV 93.3 80.0 - 100.0 fL   MCH 31.1 26.0 - 34.0 pg   MCHC 33.3 30.0 - 36.0 g/dL   RDW 14.9 11.5 - 15.5 %   Platelets 312 150 - 400 K/uL   nRBC 0.0 0.0 - 0.2 %    Comment: Performed at [facility], [address] [place], [place] [address]  Lipid panel     Status: None   Collection Time: 09/03/20 10:38 AM  Result Value Ref Range   Cholesterol 142 0 - 200 mg/dL   Triglycerides 97 <150 mg/dL   HDL 51 >40 mg/dL   Total CHOL/HDL Ratio 2.8 RATIO   VLDL 19 0 - 40 mg/dL   LDL Cholesterol 72 0 - 99 mg/dL    Comment:        Total Cholesterol/HDL:CHD Risk Coronary Heart Disease Risk Table                     Men   Women  1/2 Average Risk   3.4   3.3  Average Risk       5.0   4.4  2 X Average Risk   9.6   7.1  3 X Average Risk  23.4   11.0        Use the calculated Patient Ratio above and the CHD Risk Table to determine the patient's CHD Risk.        ATP III CLASSIFICATION (LDL):  <100     mg/dL   Optimal  100-129  mg/dL   Near or Above                    Optimal  130-159  mg/dL   Borderline  160-189  mg/dL   High  >190     mg/dL   Very High Performed at [facility], [address] [place], [place] [address]   I-STAT creatinine     Status: None   Collection Time: 09/03/20 10:57 AM  Result Value Ref Range   Creatinine, Ser 0.70 0.44 - 1.00 mg/dL    Assessment/Plan:  Hypertension blood pressure control important in reducing the progression of  atherosclerotic disease. On appropriate oral medications.   Buerger's disease ([place]) Previous history of Buerger's disease with some damage from this, but has not smoked in over a decade.  I suspect her recent exacerbation is more related to her Covid infection and some degree of chronic damage from many years ago.  This appears stable with no obvious limb threatening symptoms.  It is certainly reasonable to check her upper extremities to ensure that there are no flow-limiting  lesions in the major vasculature.  We will see her back following the study.  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  This seems to exacerbated her previous Buerger's symptoms.  No current ulceration or gangrenous changes.  No obvious limb threatening symptoms.  Is on aspirin therapy and will continue this.  Return with her noninvasive studies in the near future.  GERD (gastroesophageal reflux disease) Apparently fairly severe.  She question whether or not this would be related to her Buerger's disease which would be exceedingly unlikely.      Leotis Pain 10/21/2020, 3:29 PM   This note was created with Dragon medical transcription system.  Any errors from dictation are unintentional.

## 2020-10-21 NOTE — Progress Notes (Signed)
[facility] [place], [place] [address]  Pulmonary Sleep Medicine   Office Visit Note  Patient Name: [name] DOB: 01/10/1966 MRN [id number]  Date of Service: 10/23/2020  Complaints/HPI: Patient is here for routine pulmonary follow-up Followed for OSA on CPAP--reports she has been compliant with her CPAP each night, bleeds 2 liters of oxygen into her CPAP machine at night She is having issues with water from her nasal pillows blow in her face--she has increased her humidity level to 7 History of Buerger's disease--monitors her SpO2 levels at home, has had multiple episodes of "low SpO2" reports levels "well below 95%" when this occurs she will wear her supplemental oxygen during the day--at times she has also felt the need to wear her CPAP while awake during the day She has noticed that her fingers have been more frequently discolored blue--which prompted her to be seen by vascular earlier today She also mentions that her rheumatologist would like to increase her dose of amlodipine to help with her Buerger's disease but she is afraid that it will bottom her blood pressure out  ROS  General: (-) fever, (-) chills, (-) night sweats, (-) weakness Skin: (-) rashes, (-) itching,. Eyes: (-) visual changes, (-) redness, (-) itching. Nose and Sinuses: (-) nasal stuffiness or itchiness, (-) postnasal drip, (-) nosebleeds, (-) sinus trouble. Mouth and Throat: (-) sore throat, (-) hoarseness. Neck: (-) swollen glands, (-) enlarged thyroid, (-) neck pain. Respiratory: - cough, (-) bloody sputum, - shortness of breath, - wheezing. Cardiovascular: - ankle swelling, (-) chest pain. Lymphatic: (-) lymph node enlargement. Neurologic: (-) numbness, (-) tingling. Psychiatric: (-) anxiety, (-) depression   Current Medication: Outpatient Encounter Medications as of 10/21/2020  Medication Sig  . amLODipine (NORVASC) 10 MG tablet Take 1 tablet (10 mg total) by mouth at  bedtime.  . cilostazol (PLETAL) 50 MG tablet Take 50 mg by mouth 2 (two) times daily.  . Cranberry 1000 MG CAPS Take 1 tablet by mouth daily.  . cyanocobalamin 1000 MCG tablet Take by mouth.  . famotidine (PEPCID) 20 MG tablet Take 1 tablet (20 mg total) by mouth 2 (two) times daily.  . folic acid (FOLVITE) 1 MG tablet Take 1 tablet (1 mg total) by mouth daily.  Marland Kitchen levonorgestrel (MIRENA, 52 MG,) 20 MCG/24HR IUD by Intrauterine route.  Marland Kitchen levothyroxine (SYNTHROID) 200 MCG tablet take 200 mcg po six days a week and on saturdays take 1/2 tablet PO.  . Multiple Vitamins-Minerals (VITAMIN D3 COMPLETE PO) Take 1 capsule by mouth 2 (two) times a week.  . pantoprazole (PROTONIX) 40 MG tablet Take 1 tablet (40 mg total) by mouth 2 (two) times daily.  . Vitamin D, Ergocalciferol, (DRISDOL) 1.25 MG (50000 UNIT) CAPS capsule TAKE 1 CAPSULE ONCE A WEEK   No facility-administered encounter medications on file as of 10/21/2020.    Surgical History: Past Surgical History:  Procedure Laterality Date  . ADENOIDECTOMY     Small child  . APPENDECTOMY     Age 43  . COLON SURGERY  1980   ileostomy and internal pouch age 4  . DILATATION & CURETTAGE/HYSTEROSCOPY WITH MYOSURE N/A 05/02/2015   Procedure: DILATATION & CURETTAGE/HYSTEROSCOPY WITH MYOSURE;  Surgeon: Malachy Mood, MD;  Location: [facility] ORS;  Service: Gynecology;  Laterality: N/A;  . LAPAROSCOPIC GASTRIC SLEEVE RESECTION  07/06/2016    Medical History: Past Medical History:  Diagnosis Date  . Berger's disease 2011  . Buerger's disease ([place])   . Family history of  adverse reaction to anesthesia    sister and daughter difficult to wake up  . Folic acid deficiency   . GERD (gastroesophageal reflux disease)   . Hypertension   . Hypothyroidism   . IDA (iron deficiency anemia)   . Thrombocytosis     Family History: Family History  Problem Relation Age of Onset  . Hypertension Mother   . Congestive Heart Failure Father   . Hypertension  Father   . Congestive Heart Failure Maternal Grandmother   . Colon cancer Maternal Grandfather 7  . Skin cancer Maternal Aunt        Basal cell  . Skin cancer Maternal Uncle        Basal cell  . Skin cancer Cousin        Basal Cell    Social History: Social History   Socioeconomic History  . Marital status: Married    Spouse name: Not on file  . Number of children: Not on file  . Years of education: Not on file  . Highest education level: Not on file  Occupational History  . Not on file  Tobacco Use  . Smoking status: Former Smoker    Types: Cigarettes    Quit date: 04/28/2010    Years since quitting: 10.4  . Smokeless tobacco: Never Used  Substance and Sexual Activity  . Alcohol use: No    Alcohol/week: 0.0 standard drinks  . Drug use: No  . Sexual activity: Yes    Birth control/protection: None  Other Topics Concern  . Not on file  Social History Narrative  . Not on file   Social Determinants of Health   Financial Resource Strain:   . Difficulty of Paying Living Expenses: Not on file  Food Insecurity:   . Worried About Charity fundraiser in the Last Year: Not on file  . Ran Out of Food in the Last Year: Not on file  Transportation Needs:   . Lack of Transportation (Medical): Not on file  . Lack of Transportation (Non-Medical): Not on file  Physical Activity:   . Days of Exercise per Week: Not on file  . Minutes of Exercise per Session: Not on file  Stress:   . Feeling of Stress : Not on file  Social Connections:   . Frequency of Communication with Friends and Family: Not on file  . Frequency of Social Gatherings with Friends and Family: Not on file  . Attends Religious Services: Not on file  . Active Member of Clubs or Organizations: Not on file  . Attends Archivist Meetings: Not on file  . Marital Status: Not on file  Intimate Partner Violence:   . Fear of Current or Ex-Partner: Not on file  . Emotionally Abused: Not on file  . Physically  Abused: Not on file  . Sexually Abused: Not on file    Vital Signs: Blood pressure 111/63, pulse 87, temperature 97.8 F (36.6 C), resp. rate 16, height 5' 7.5" (1.715 m), weight (!) 340 lb (154.2 kg), SpO2 97 %.  Examination: General Appearance: The patient is well-developed, well-nourished, and in no distress. Skin: Gross inspection of skin unremarkable. Head: normocephalic, no gross deformities. Eyes: no gross deformities noted. ENT: ears appear grossly normal no exudates. Neck: Supple. No thyromegaly. No LAD. Respiratory: Clear throughout, no rhonchi, wheeze or rales noted. Cardiovascular: Normal S1 and S2 without murmur or rub. Extremities: No cyanosis. pulses are equal. Neurologic: Alert and oriented. No involuntary movements.  LABS: Recent Results (from  the past 2160 hour(s))  UA/M w/rflx Culture, Routine     Status: Abnormal   Collection Time: 08/11/20  4:20 PM   Specimen: Urine   Urine  Result Value Ref Range   Specific Gravity, UA      >=1.030 (A) 1.005 - 1.030   pH, UA 5.5 5.0 - 7.5   Color, UA Yellow Yellow   Appearance Ur Clear Clear   Leukocytes,UA Trace (A) Negative   Protein,UA Negative Negative/Trace   Glucose, UA Negative Negative   Ketones, UA Negative Negative   RBC, UA Negative Negative   Bilirubin, UA Negative Negative   Urobilinogen, Ur 0.2 0.2 - 1.0 mg/dL   Nitrite, UA Negative Negative   Microscopic Examination See below:     Comment: Microscopic was indicated and was performed.   Urinalysis Reflex Comment     Comment: This specimen has reflexed to a Urine Culture.  Microscopic Examination     Status: Abnormal   Collection Time: 08/11/20  4:20 PM   Urine  Result Value Ref Range   WBC, UA 6-10 (A) 0 - 5 /hpf   RBC None seen 0 - 2 /hpf   Epithelial Cells (non renal) 0-10 0 - 10 /hpf   Casts None seen None seen /lpf   Bacteria, UA None seen None seen/Few  Urine Culture, Reflex     Status: Abnormal   Collection Time: 08/11/20  4:20 PM   Urine   Result Value Ref Range   Urine Culture, Routine Final report (A)    Organism ID, Bacteria Enterococcus faecalis (A)     Comment: 25,000-50,000 colony forming units per mL   Antimicrobial Susceptibility Comment     Comment:       ** S = Susceptible; I = Intermediate; R = Resistant **                    P = Positive; N = Negative             MICS are expressed in micrograms per mL    Antibiotic                 RSLT#1    RSLT#2    RSLT#3    RSLT#4 Ciprofloxacin                  S Levofloxacin                   S Nitrofurantoin                 S Penicillin                     S Tetracycline                   S Vancomycin                     S   Novel Coronavirus, NAA ([facility])     Status: None   Collection Time: 08/20/20  3:51 PM   Specimen: Nasopharyngeal(NP) swabs in vial transport medium   Nasopharynge  Screenin  Result Value Ref Range   SARS-CoV-2, NAA Not Detected Not Detected    Comment: This nucleic acid amplification test was developed and its performance characteristics determined by [employer]. Nucleic acid amplification tests include RT-PCR and TMA. This test has not been FDA cleared or approved. This test has been authorized by FDA under an Emergency Use  Authorization (EUA). This test is only authorized for the duration of time the declaration that circumstances exist justifying the authorization of the emergency use of in vitro diagnostic tests for detection of SARS-CoV-2 virus and/or diagnosis of COVID-19 infection under section 564(b)(1) of the Act, 21 U.S.C. 342AJG-8(T) (1), unless the authorization is terminated or revoked sooner. When diagnostic testing is negative, the possibility of a false negative result should be considered in the context of a patient's recent exposures and the presence of clinical signs and symptoms consistent with COVID-19. An individual without symptoms of COVID-19 and who is not shedding SARS-CoV-2 virus wo uld expect to have  a negative (not detected) result in this assay.   SARS-COV-2, NAA 2 DAY TAT     Status: None   Collection Time: 08/20/20  3:51 PM   Nasopharynge  Screenin  Result Value Ref Range   SARS-CoV-2, NAA 2 DAY TAT Performed   CBC     Status: Abnormal   Collection Time: 09/03/20 10:38 AM  Result Value Ref Range   WBC 12.4 (H) 4.0 - 10.5 K/uL   RBC 4.79 3.87 - 5.11 MIL/uL   Hemoglobin 14.9 12.0 - 15.0 g/dL   HCT 44.7 36 - 46 %   MCV 93.3 80.0 - 100.0 fL   MCH 31.1 26.0 - 34.0 pg   MCHC 33.3 30.0 - 36.0 g/dL   RDW 14.9 11.5 - 15.5 %   Platelets 312 150 - 400 K/uL   nRBC 0.0 0.0 - 0.2 %    Comment: Performed at [facility], [address] [place], [place] [address]  Lipid panel     Status: None   Collection Time: 09/03/20 10:38 AM  Result Value Ref Range   Cholesterol 142 0 - 200 mg/dL   Triglycerides 97 <150 mg/dL   HDL 51 >40 mg/dL   Total CHOL/HDL Ratio 2.8 RATIO   VLDL 19 0 - 40 mg/dL   LDL Cholesterol 72 0 - 99 mg/dL    Comment:        Total Cholesterol/HDL:CHD Risk Coronary Heart Disease Risk Table                     Men   Women  1/2 Average Risk   3.4   3.3  Average Risk       5.0   4.4  2 X Average Risk   9.6   7.1  3 X Average Risk  23.4   11.0        Use the calculated Patient Ratio above and the CHD Risk Table to determine the patient's CHD Risk.        ATP III CLASSIFICATION (LDL):  <100     mg/dL   Optimal  100-129  mg/dL   Near or Above                    Optimal  130-159  mg/dL   Borderline  160-189  mg/dL   High  >190     mg/dL   Very High Performed at [facility], [address] [place], [place] [address]   I-STAT creatinine     Status: None   Collection Time: 09/03/20 10:57 AM  Result Value Ref Range   Creatinine, Ser 0.70 0.44 - 1.00 mg/dL    Radiology: CT Angio Chest W/Cm &/Or Wo Cm  Result Date: 09/10/2020 CLINICAL DATA:  Chest pain. EXAM: CT ANGIOGRAPHY CHEST WITH CONTRAST TECHNIQUE: Multidetector CT imaging of  the  chest was performed using the standard protocol during bolus administration of intravenous contrast. Multiplanar CT image reconstructions and MIPs were obtained to evaluate the vascular anatomy. CONTRAST:  46m OMNIPAQUE IOHEXOL 350 MG/ML SOLN COMPARISON:  None. FINDINGS: Cardiovascular: Satisfactory opacification of the pulmonary arteries to the segmental level. No evidence of pulmonary embolism. Normal heart size. No pericardial effusion. Mediastinum/Nodes: No enlarged mediastinal, hilar, or axillary lymph nodes. Thyroid gland, trachea, and esophagus demonstrate no significant findings. Lungs/Pleura: Mild mosaic pattern is noted throughout both lungs suggesting possible multifocal inflammation or small airways disease. No pleural effusion or pneumothorax. Upper Abdomen: No acute abnormality. Musculoskeletal: No chest wall abnormality. No acute or significant osseous findings. Review of the MIP images confirms the above findings. IMPRESSION: No definite evidence of pulmonary embolus. Mild mosaic pattern is noted throughout both lungs suggesting possible multifocal pneumonia or small airway disease. Electronically Signed   By: JMarijo ConceptionM.D.   On: 09/10/2020 10:25    Assessment and Plan: Patient Active Problem List   Diagnosis Date Noted  . Encounter for general adult medical examination with abnormal findings 08/13/2020  . 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 Obstructive sleep apnea 08/13/2020  . Chest pain 08/13/2020  . Hypothyroidism due to Hashimoto's thyroiditis 08/13/2020  . Gastroesophageal reflux disease 07/21/2020  . Elevated amylase 04/06/2020  . Generalized abdominal pain 03/23/2020  . 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 Vitamin D deficiency 06/06/2019  . Peripheral vascular disease ([place] 10/11/2018  . Shortness of breath 06/07/2018  .  Arthralgia of both knees 06/07/2018  . Decreased functional mobility 06/07/2018  . Morbid obesity with BMI of 50.0-59.9, adult ([place] 06/04/2013  . GERD (gastroesophageal reflux disease) 06/04/2013  . Buerger's disease ([place] 06/04/2013  . Sleep apnea 06/04/2013  . Hypothyroid 05/11/2013  . Hypertension 05/11/2013    1. OSA on CPAP Continue with nightly compliance on CPAP Will review download in 1 month--discussed only wearing CPAP at night  2. Nocturnal hypoxia Continue with supplemental oxygen through CPAP  3. Personal history of COVID-19 Rheumatologist suggested she have her antibody levels checked prior to receiving next dose of vaccination due to her chronic medical conditions and requiring emergency department visit with first round of vaccines - SARS-CoV-2 Antibody, IgM  4. Hypoxia Per her reports, SpO2 levels well below 95% but is having frequent flares of Buerger's disease leaving fingertips blue--SpO2 levels likely inaccurate Needs SpO2 monitor that can be used on ear, forehead or nose to avoid use on fingers due to chronic condition causing diminished blood flow to fingers Discussed oxygen is not needed unless SpO2 levels drop below 90%  - For home use only DME Other see comment  General Counseling: I have discussed the findings of the evaluation and examination with LVaughan Basta  I have also discussed any further diagnostic evaluation thatmay be needed or ordered today. Jerris verbalizes understanding of the findings of todays visit. We also reviewed her medications today and discussed drug interactions and side effects including but not limited excessive drowsiness and altered mental states. We also discussed that there is always a risk not just to her but also people around her. she has been encouraged to call the office with any questions or concerns that should arise related to todays visit.  Orders Placed This Encounter  Procedures  . For home use only DME Other see comment  Needs a pulse oximeter that works on ear/forehead/nose to monitor SpO2 and HR levels    Order Specific Question:   Length of Need    Answer:   [employer]  . SARS-CoV-2 Antibody, IgM    Order Specific Question:   Is this test for diagnosis or screening    Answer:   Screening    Order Specific Question:   Symptomatic for COVID-19 as defined by CDC    Answer:   No    Order Specific Question:   Hospitalized for COVID-19    Answer:   No    Order Specific Question:   Admitted to ICU for COVID-19    Answer:   No    Order Specific Question:   Previously tested for COVID-19    Answer:   Yes    Order Specific Question:   Resident in a congregate (group) care setting    Answer:   No    Order Specific Question:   Employed in healthcare setting    Answer:   No    Order Specific Question:   Pregnant    Answer:   No     Time spent: 30  I have personally obtained a history, examined the patient, evaluated laboratory and imaging results, formulated the assessment and plan and placed orders. This patient was seen by Casey Burkitt AGNP-C in Collaboration with Dr. Devona Konig as a part of collaborative care agreement.    Allyne Gee, MD [facility] Pulmonary and Critical Care Sleep medicine

## 2020-10-21 NOTE — Assessment & Plan Note (Signed)
Previous history of Buerger's disease with some damage from this, but has not smoked in over a decade.  I suspect her recent exacerbation is more related to her Covid infection and some degree of chronic damage from many years ago.  This appears stable with no obvious limb threatening symptoms.  It is certainly reasonable to check her upper extremities to ensure that there are no flow-limiting lesions in the major vasculature.  We will see her back following the study.

## 2020-10-21 NOTE — Assessment & Plan Note (Signed)
Seems to have some small vessel embolization likely from her Covid infection last year.  This seems to exacerbated her previous Buerger's symptoms.  No current ulceration or gangrenous changes.  No obvious limb threatening symptoms.  Is on aspirin therapy and will continue this.  Return with her noninvasive studies in the near future.

## 2020-10-22 ENCOUNTER — Telehealth: Payer: Self-pay

## 2020-10-22 NOTE — Telephone Encounter (Signed)
Gave [facility] orders for cpap. [name]

## 2020-10-23 ENCOUNTER — Telehealth: Payer: Self-pay

## 2020-10-23 ENCOUNTER — Encounter: Payer: Self-pay | Admitting: Internal Medicine

## 2020-10-23 NOTE — Patient Instructions (Signed)

## 2020-10-23 NOTE — Telephone Encounter (Signed)
Pt called as per Lovena Le to call her rheumatoligist,  Advised pt to hold medication if her BP is low and she is dizzy until she speaks to rheumatology.  Advised pt to keep check of her Bp.

## 2020-10-24 ENCOUNTER — Telehealth: Payer: Self-pay

## 2020-10-24 ENCOUNTER — Ambulatory Visit: Payer: 59 | Admitting: Nurse Practitioner

## 2020-10-24 NOTE — Telephone Encounter (Signed)
Faxed wheelchair and pulse ox order to [facility]

## 2020-11-03 ENCOUNTER — Ambulatory Visit: Payer: 59 | Admitting: Hospice and Palliative Medicine

## 2020-11-04 ENCOUNTER — Encounter: Payer: Self-pay | Admitting: Hospice and Palliative Medicine

## 2020-11-04 ENCOUNTER — Encounter (INDEPENDENT_AMBULATORY_CARE_PROVIDER_SITE_OTHER): Payer: Self-pay

## 2020-11-05 ENCOUNTER — Ambulatory Visit (INDEPENDENT_AMBULATORY_CARE_PROVIDER_SITE_OTHER): Payer: 59 | Admitting: Nurse Practitioner

## 2020-11-05 ENCOUNTER — Ambulatory Visit: Payer: 59 | Admitting: Nurse Practitioner

## 2020-11-05 ENCOUNTER — Encounter (INDEPENDENT_AMBULATORY_CARE_PROVIDER_SITE_OTHER): Payer: Self-pay | Admitting: Nurse Practitioner

## 2020-11-05 ENCOUNTER — Other Ambulatory Visit: Payer: Self-pay

## 2020-11-05 ENCOUNTER — Ambulatory Visit (INDEPENDENT_AMBULATORY_CARE_PROVIDER_SITE_OTHER): Payer: 59

## 2020-11-05 VITALS — BP 127/80 | HR 84 | Resp 16

## 2020-11-05 DIAGNOSIS — I10 Essential (primary) hypertension: Secondary | ICD-10-CM

## 2020-11-05 DIAGNOSIS — M79604 Pain in right leg: Secondary | ICD-10-CM | POA: Diagnosis not present

## 2020-11-05 DIAGNOSIS — I731 Thromboangiitis obliterans [Buerger's disease]: Secondary | ICD-10-CM

## 2020-11-05 DIAGNOSIS — M79605 Pain in left leg: Secondary | ICD-10-CM

## 2020-11-05 DIAGNOSIS — I744 Embolism and thrombosis of arteries of extremities, unspecified: Secondary | ICD-10-CM | POA: Diagnosis not present

## 2020-11-09 ENCOUNTER — Encounter: Payer: Self-pay | Admitting: Nurse Practitioner

## 2020-11-13 ENCOUNTER — Encounter (INDEPENDENT_AMBULATORY_CARE_PROVIDER_SITE_OTHER): Payer: Self-pay | Admitting: Nurse Practitioner

## 2020-11-13 NOTE — Progress Notes (Addendum)
Subjective:    Patient ID: [name], female    DOB: 11/21/1966, 54 y.o.   MRN: [id number] Chief Complaint  Patient presents with  . Follow-up    ultrasound follow up    [name] is a 54 y.o. female.  The patient presents today for follow-up studies related to a possible Buerger's disease/cyanosis of her fingers.  Patient was previously diagnosed with Brueger's disease by angiogram about a decade ago.  The patient has stop smoking and has not had any further worsening symptoms until now.  The patient recently had Covid and developed coolness and cyanotic discoloration on both hands.  This is also happened on her toes as well.  The patient also has a known nodule on her left index finger which is also very concerning for her.  The patient overall is very concerned due to her previous history of Buerger's disease.  There have been no open wounds or ulcerations.  The patient also notes that she is recently been having some leg pain and discomfort as well and is concerned about the circulation in her lower extremities.  Today noninvasive studies show triphasic waveforms throughout the subclavian, brachial, radial and ulnar arteries bilaterally.  The patient also had individual finger waveforms done and all waveforms to the digits are strong triphasic at room temperature.   Review of Systems  Skin: Positive for color change.  Neurological: Positive for weakness.  All other systems reviewed and are negative.      Objective:   Physical Exam Vitals reviewed.  Constitutional:      Appearance: She is obese.  HENT:     Head: Normocephalic.  Cardiovascular:     Rate and Rhythm: Normal rate.     Pulses:          Radial pulses are 2+ on the right side and 2+ on the left side.  Pulmonary:     Effort: Pulmonary effort is normal.  Skin:    General: Skin is warm and dry.     Capillary Refill: Capillary refill takes 2 to 3 seconds.  Neurological:     Mental Status: She is alert and  oriented to person, place, and time.  Psychiatric:        Attention and Perception: Attention normal.        Mood and Affect: Mood is anxious.        Speech: Speech normal.        Behavior: Behavior normal.        Thought Content: Thought content normal.        Cognition and Memory: Cognition and memory normal.        Judgment: Judgment normal.     BP 127/80 (BP Location: Right Arm)   Pulse 84   Resp 16   Past Medical History:  Diagnosis Date  . Berger's disease 2011  . Buerger's disease ([place])   . Family history of adverse reaction to anesthesia    sister and daughter difficult to wake up  . Folic acid deficiency   . GERD (gastroesophageal reflux disease)   . Hypertension   . Hypothyroidism   . IDA (iron deficiency anemia)   . Thrombocytosis     Social History   Socioeconomic History  . Marital status: Married    Spouse name: Not on file  . Number of children: Not on file  . Years of education: Not on file  . Highest education level: Not on file  Occupational History  . Not  on file  Tobacco Use  . Smoking status: Former Smoker    Types: Cigarettes    Quit date: 04/28/2010    Years since quitting: 10.5  . Smokeless tobacco: Never Used  Substance and Sexual Activity  . Alcohol use: No    Alcohol/week: 0.0 standard drinks  . Drug use: No  . Sexual activity: Yes    Birth control/protection: None  Other Topics Concern  . Not on file  Social History Narrative  . Not on file   Social Determinants of Health   Financial Resource Strain:   . Difficulty of Paying Living Expenses: Not on file  Food Insecurity:   . Worried About Charity fundraiser in the Last Year: Not on file  . Ran Out of Food in the Last Year: Not on file  Transportation Needs:   . Lack of Transportation (Medical): Not on file  . Lack of Transportation (Non-Medical): Not on file  Physical Activity:   . Days of Exercise per Week: Not on file  . Minutes of Exercise per Session: Not on file    Stress:   . Feeling of Stress : Not on file  Social Connections:   . Frequency of Communication with Friends and Family: Not on file  . Frequency of Social Gatherings with Friends and Family: Not on file  . Attends Religious Services: Not on file  . Active Member of Clubs or Organizations: Not on file  . Attends Archivist Meetings: Not on file  . Marital Status: Not on file  Intimate Partner Violence:   . Fear of Current or Ex-Partner: Not on file  . Emotionally Abused: Not on file  . Physically Abused: Not on file  . Sexually Abused: Not on file    Past Surgical History:  Procedure Laterality Date  . ADENOIDECTOMY     Small child  . APPENDECTOMY     Age 24  . COLON SURGERY  1980   ileostomy and internal pouch age 27  . DILATATION & CURETTAGE/HYSTEROSCOPY WITH MYOSURE N/A 05/02/2015   Procedure: DILATATION & CURETTAGE/HYSTEROSCOPY WITH MYOSURE;  Surgeon: Malachy Mood, MD;  Location: [facility] ORS;  Service: Gynecology;  Laterality: N/A;  . LAPAROSCOPIC GASTRIC SLEEVE RESECTION  07/06/2016    Family History  Problem Relation Age of Onset  . Hypertension Mother   . Congestive Heart Failure Father   . Hypertension Father   . Congestive Heart Failure Maternal Grandmother   . Colon cancer Maternal Grandfather 68  . Skin cancer Maternal Aunt        Basal cell  . Skin cancer Maternal Uncle        Basal cell  . Skin cancer Cousin        Basal Cell    Allergies  Allergen Reactions  . Tape Rash    Some tapes cause a rash, possible the paper tape.    CBC Latest Ref Rng & Units 09/03/2020 04/28/2020 04/02/2020  WBC 4.0 - 10.5 K/uL 12.4(H) - 10.2  Hemoglobin 12.0 - 15.0 g/dL 14.9 - 15.7  Hematocrit 36 - 46 % 44.7 48.2(H) 46.5  Platelets 150 - 400 K/uL 312 - 345      CMP     Component Value Date/Time   NA 136 04/28/2020 1159   NA 139 04/02/2020 1519   K 3.6 04/28/2020 1159   CL 100 04/28/2020 1159   CO2 24 04/28/2020 1159   GLUCOSE 103 (H) 04/28/2020 1159    BUN 14 04/28/2020 1159  BUN 10 04/02/2020 1519   CREATININE 0.70 09/03/2020 1057   CALCIUM 9.3 04/28/2020 1159   PROT 7.8 04/02/2020 1519   ALBUMIN 4.3 04/02/2020 1519   AST 29 04/02/2020 1519   ALT 30 04/02/2020 1519   ALKPHOS 115 04/02/2020 1519   BILITOT 0.8 04/02/2020 1519   GFRNONAA >60 04/28/2020 1159   GFRAA >60 04/28/2020 1159     No results found.     Assessment & Plan:   1. Buerger's disease ([place]) The patient's previous history of Buerger's disease is likely contributing to some of her symptoms however based on the noninvasive studies there is no flow-limiting lesions and there are no current limb threatening symptoms.  It is not likely that her Buerger's disease has advanced due to the fact that she has not smoked in over a decade.  It is also highly likely that this is related to her Covid diagnosis.  Currently it is known that Covid does cause no vascular complications, some of which are not fully known or even understood.  The patient will be returning for noninvasive studies of her lower extremities, we will evaluate her changes at her follow-up visit 2. Primary hypertension Continue antihypertensive medications as already ordered, these medications have been reviewed and there are no changes at this time.   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  However today noninvasive studies do not suggest any current evidence of embolism.  4. Pain in both lower extremities  Recommend:  The patient has atypical pain symptoms for pure atherosclerotic disease.   Noninvasive studies including ABI's and venous ultrasound of the legs will be obtained and the patient will follow up with me to review these studies.  I suspect the patient is c/o pseudoclaudication.  Patient should have an evaluation of her LS spine which I defer to the primary service.  The  patient should continue walking and begin a more formal exercise program. The patient should continue his antiplatelet therapy and aggressive treatment of the lipid abnormalities.  The patient should begin wearing graduated compression socks 15-20 mmHg strength to control edema.    Current Outpatient Medications on File Prior to Visit  Medication Sig Dispense Refill  . amLODipine (NORVASC) 10 MG tablet Take 1 tablet (10 mg total) by mouth at bedtime. 90 tablet 3  . cilostazol (PLETAL) 50 MG tablet Take 50 mg by mouth 2 (two) times daily.    . Cranberry 1000 MG CAPS Take 1 tablet by mouth daily.    . cyanocobalamin 1000 MCG tablet Take by mouth.    . famotidine (PEPCID) 20 MG tablet Take 1 tablet (20 mg total) by mouth 2 (two) times daily. 60 tablet 3  . folic acid (FOLVITE) 1 MG tablet Take 1 tablet (1 mg total) by mouth daily. 90 tablet 3  . levonorgestrel (MIRENA, 52 MG,) 20 MCG/24HR IUD by Intrauterine route.    Marland Kitchen levothyroxine (SYNTHROID) 200 MCG tablet take 200 mcg po six days a week and on saturdays take 1/2 tablet PO. 90 tablet 3  . Multiple Vitamins-Minerals (VITAMIN D3 COMPLETE PO) Take 1 capsule by mouth 2 (two) times a week.    . pantoprazole (PROTONIX) 40 MG tablet Take 1 tablet (40 mg total) by mouth 2 (two) times daily. 180 tablet 3  . Vitamin D, Ergocalciferol, (DRISDOL) 1.25 MG (50000 UNIT) CAPS capsule TAKE 1 CAPSULE ONCE A WEEK 12 capsule 3  No current facility-administered medications on file prior to visit.    There are no Patient Instructions on file for this visit. No follow-ups on file.   Kris Hartmann, NP

## 2020-11-15 ENCOUNTER — Encounter (INDEPENDENT_AMBULATORY_CARE_PROVIDER_SITE_OTHER): Payer: Self-pay

## 2020-11-15 ENCOUNTER — Other Ambulatory Visit: Payer: Self-pay | Admitting: Internal Medicine

## 2020-11-15 ENCOUNTER — Encounter: Payer: Self-pay | Admitting: Nurse Practitioner

## 2020-11-15 MED ORDER — FLUCONAZOLE 150 MG PO TABS: ORAL_TABLET | ORAL | 0 refills | Status: DC

## 2020-11-16 ENCOUNTER — Other Ambulatory Visit: Payer: Self-pay | Admitting: Nurse Practitioner

## 2020-11-16 DIAGNOSIS — B3731 Acute candidiasis of vulva and vagina: Secondary | ICD-10-CM

## 2020-11-16 DIAGNOSIS — B373 Candidiasis of vulva and vagina: Secondary | ICD-10-CM

## 2020-11-16 MED ORDER — FLUCONAZOLE 150 MG PO TABS: ORAL_TABLET | ORAL | 1 refills | Status: DC

## 2020-11-17 ENCOUNTER — Encounter: Payer: Self-pay | Admitting: Hospice and Palliative Medicine

## 2020-11-21 ENCOUNTER — Other Ambulatory Visit: Payer: 59

## 2020-11-21 ENCOUNTER — Encounter: Payer: Self-pay | Admitting: Obstetrics and Gynecology

## 2020-11-21 ENCOUNTER — Ambulatory Visit (INDEPENDENT_AMBULATORY_CARE_PROVIDER_SITE_OTHER): Payer: 59 | Admitting: Obstetrics and Gynecology

## 2020-11-21 ENCOUNTER — Other Ambulatory Visit: Payer: Self-pay

## 2020-11-21 VITALS — BP 138/82 | Ht 67.0 in

## 2020-11-21 DIAGNOSIS — N760 Acute vaginitis: Secondary | ICD-10-CM

## 2020-11-21 MED ORDER — FLUCONAZOLE 150 MG PO TABS: 150.0000 mg | ORAL_TABLET | Freq: Once | ORAL | 0 refills | Status: AC

## 2020-11-21 MED ORDER — NYSTATIN 100000 UNIT/GM EX CREA: 1.0000 "application " | TOPICAL_CREAM | Freq: Two times a day (BID) | CUTANEOUS | 1 refills | Status: DC

## 2020-11-21 MED ORDER — TRIAMCINOLONE ACETONIDE 0.1 % EX CREA: 1.0000 "application " | TOPICAL_CREAM | Freq: Two times a day (BID) | CUTANEOUS | 1 refills | Status: DC

## 2020-11-21 NOTE — Progress Notes (Signed)
Obstetrics & Gynecology Office Visit   Chief Complaint:  Chief Complaint  Patient presents with  . Vaginal Pain    Vaginal pain - Procedure RM    History of Present Illness: Ms. [name] is a 54 y.o. [id number] who LMP was No LMP recorded. (Menstrual status: IUD)., presents today for a problem visit.   Patient complains of an abnormal vaginal discharge for 1 weeks. Discharge described as: thin. Vaginal symptoms include local irritation.   Other associated symptoms: none.Menstrual pattern: She had no vaginal bleeding concerns Continues on po progestin for endometrial protection secondary to history of endometrial hyperplasia.  She admits to recent antibiotic exposure, denies changes in soaps, detergents coinciding with the onset of her symptoms.  She has previously self treated or been under treatment by another provider for these symptoms. Was prescribe fluconazole by PCP with some initial improvement before resumptions in symptoms  Area of irritation is toward the rectum.  Stings with urination.    Review of Systems: Review of Systems  Constitutional: Negative.   Gastrointestinal: Negative.   Genitourinary: Positive for dysuria. Negative for flank pain, frequency, hematuria and urgency.  Skin: Positive for rash. Negative for itching.    Past Medical History:  Past Medical History:  Diagnosis Date  . Berger's disease 2011  . Buerger's disease ([place])   . Family history of adverse reaction to anesthesia    sister and daughter difficult to wake up  . Folic acid deficiency   . GERD (gastroesophageal reflux disease)   . Hypertension   . Hypothyroidism   . IDA (iron deficiency anemia)   . Thrombocytosis     Past Surgical History:  Past Surgical History:  Procedure Laterality Date  . ADENOIDECTOMY     Small child  . APPENDECTOMY     Age 16  . COLON SURGERY  1980   ileostomy and internal pouch age 11  . DILATATION & CURETTAGE/HYSTEROSCOPY WITH MYOSURE N/A 05/02/2015    Procedure: DILATATION & CURETTAGE/HYSTEROSCOPY WITH MYOSURE;  Surgeon: Malachy Mood, MD;  Location: [facility] ORS;  Service: Gynecology;  Laterality: N/A;  . LAPAROSCOPIC GASTRIC SLEEVE RESECTION  07/06/2016    Gynecologic History: No LMP recorded. (Menstrual status: IUD).  Obstetric History: [id number]  Family History:  Family History  Problem Relation Age of Onset  . Hypertension Mother   . Congestive Heart Failure Father   . Hypertension Father   . Congestive Heart Failure Maternal Grandmother   . Colon cancer Maternal Grandfather 51  . Skin cancer Maternal Aunt        Basal cell  . Skin cancer Maternal Uncle        Basal cell  . Skin cancer Cousin        Basal Cell    Social History:  Social History   Socioeconomic History  . Marital status: Married    Spouse name: Not on file  . Number of children: Not on file  . Years of education: Not on file  . Highest education level: Not on file  Occupational History  . Not on file  Tobacco Use  . Smoking status: Former Smoker    Types: Cigarettes    Quit date: 04/28/2010    Years since quitting: 10.5  . Smokeless tobacco: Never Used  Substance and Sexual Activity  . Alcohol use: No    Alcohol/week: 0.0 standard drinks  . Drug use: No  . Sexual activity: Yes    Birth control/protection: None  Other Topics Concern  .  Not on file  Social History Narrative  . Not on file   Social Determinants of Health   Financial Resource Strain: Not on file  Food Insecurity: Not on file  Transportation Needs: Not on file  Physical Activity: Not on file  Stress: Not on file  Social Connections: Not on file  Intimate Partner Violence: Not on file    Allergies:  Allergies  Allergen Reactions  . Tape Rash    Some tapes cause a rash, possible the paper tape.    Medications: Prior to Admission medications   Medication Sig Start Date End Date Taking? Authorizing Provider  amLODipine (NORVASC) 10 MG tablet Take 1 tablet (10 mg  total) by mouth at bedtime. 10/23/19   Lavera Guise, MD  cilostazol (PLETAL) 50 MG tablet Take 50 mg by mouth 2 (two) times daily. 08/07/20 08/07/21  [provider]  Cranberry 1000 MG CAPS Take 1 tablet by mouth daily.    [provider]  cyanocobalamin 1000 MCG tablet Take by mouth.    [provider]  famotidine (PEPCID) 20 MG tablet Take 1 tablet (20 mg total) by mouth 2 (two) times daily. 07/28/20   Lavera Guise, MD  fluconazole (DIFLUCAN) 150 MG tablet Take one tab a day x 3 days and then as needed for next 4 weeks 11/16/20   Ronnell Freshwater, NP  fluconazole (DIFLUCAN) 150 MG tablet Take 1 tablet (150 mg total) by mouth once for 1 dose. Can take additional dose three days later if symptoms persist 11/21/20 11/21/20  Malachy Mood, MD  folic acid (FOLVITE) 1 MG tablet Take 1 tablet (1 mg total) by mouth daily. 10/23/19   Lavera Guise, MD  levonorgestrel (MIRENA, 52 MG,) 20 MCG/24HR IUD by Intrauterine route.    [provider]  levothyroxine (SYNTHROID) 200 MCG tablet take 200 mcg po six days a week and on saturdays take 1/2 tablet PO. 03/05/20   Boscia, Greer Ee, NP  Multiple Vitamins-Minerals (VITAMIN D3 COMPLETE PO) Take 1 capsule by mouth 2 (two) times a week.    [provider]  nystatin cream (MYCOSTATIN) Apply 1 application topically 2 (two) times daily. 11/21/20   Malachy Mood, MD  pantoprazole (PROTONIX) 40 MG tablet Take 1 tablet (40 mg total) by mouth 2 (two) times daily. 07/28/20   Lavera Guise, MD  triamcinolone (KENALOG) 0.1 % Apply 1 application topically 2 (two) times daily. 11/21/20   Malachy Mood, MD  Vitamin D, Ergocalciferol, (DRISDOL) 1.25 MG (50000 UNIT) CAPS capsule TAKE 1 CAPSULE ONCE A WEEK 06/04/20   Ronnell Freshwater, NP    Physical Exam Vitals:  Vitals:   11/21/20 1323  BP: 138/82   No LMP recorded. (Menstrual status: IUD).  General: NAD HEENT: normocephalic, anicteric Thyroid: no enlargement, no  palpable nodules Pulmonary: No increased work of breathing Cardiovascular: RRR, distal pulses 2+ Abdomen: NABS, soft, non-tender, non-distended.  Umbilicus without lesions.  No hepatomegaly, splenomegaly or masses palpable. No evidence of hernia  Genitourinary:  External: Normal external female genitalia.  Normal urethral meatus, normal  Bartholin's and Skene's glands.    Vagina: Normal vaginal mucosa, no evidence of prolapse.    adnexal masses  Rectal: deferred, there is erythema and mucosal iritation in the posterior fourchette and perineum  Lymphatic: no evidence of inguinal lymphadenopathy Extremities: no edema, erythema, or tenderness Neurologic: Grossly intact Psychiatric: mood appropriate, affect full  Female chaperone present for pelvic  portions of the physical exam  Wet Prep: Clue  Cells: Negative Fungal elements: rare Trichomonas: Negative   Assessment: 54 y.o. [id number] candida vaginitis  Plan: Problem List Items Addressed This Visit   None   Visit Diagnoses    Acute vaginitis    -  Primary   Relevant Orders   NuSwab BV and Candida, NAA     1) Risk factors for bacterial vaginosis and candida infections discussed.  We discussed normal vaginal flora/microbiome.  Any factors that may alter the microbiome increase the risk of these opportunistic infections.  These include changes in pH, antibiotic exposures, diabetes, wet bathing suits etc.  We discussed that treatment is aimed at eradicating abnormal bacterial overgrowth and or yeast.  There may be some role for vaginal probiotics in restoring normal vaginal flora.   - Nystatin triamcinolone topical written - [place] very rare hyphea - Take another dose of diflucan today and repeat dose Sunday  2) A total of 15 minutes were spent in face-to-face contact with the patient during this encounter with over half of that time devoted to counseling and coordination of care.  3) Return if symptoms worsen or fail to improve  otherwise annual in 5-6 months.   Malachy Mood, MD, [facility], [facility] 11/21/2020, 2:00 PM

## 2020-11-23 ENCOUNTER — Other Ambulatory Visit: Payer: Self-pay | Admitting: Internal Medicine

## 2020-11-23 DIAGNOSIS — K219 Gastro-esophageal reflux disease without esophagitis: Secondary | ICD-10-CM

## 2020-11-25 LAB — NUSWAB BV AND CANDIDA, NAA
Candida albicans, NAA: NEGATIVE
Candida glabrata, NAA: NEGATIVE

## 2020-11-26 ENCOUNTER — Encounter: Payer: Self-pay | Admitting: Hospice and Palliative Medicine

## 2020-11-26 ENCOUNTER — Encounter (INDEPENDENT_AMBULATORY_CARE_PROVIDER_SITE_OTHER): Payer: Self-pay

## 2020-11-30 ENCOUNTER — Encounter: Payer: Self-pay | Admitting: Nurse Practitioner

## 2020-12-03 ENCOUNTER — Encounter: Payer: Self-pay | Admitting: Hospice and Palliative Medicine

## 2020-12-03 DIAGNOSIS — D735 Infarction of spleen: Secondary | ICD-10-CM | POA: Insufficient documentation

## 2020-12-04 ENCOUNTER — Encounter: Payer: Self-pay | Admitting: Hospice and Palliative Medicine

## 2020-12-04 ENCOUNTER — Other Ambulatory Visit (INDEPENDENT_AMBULATORY_CARE_PROVIDER_SITE_OTHER): Payer: Self-pay | Admitting: Nurse Practitioner

## 2020-12-04 DIAGNOSIS — I731 Thromboangiitis obliterans [Buerger's disease]: Secondary | ICD-10-CM

## 2020-12-04 DIAGNOSIS — M79605 Pain in left leg: Secondary | ICD-10-CM

## 2020-12-10 ENCOUNTER — Ambulatory Visit (INDEPENDENT_AMBULATORY_CARE_PROVIDER_SITE_OTHER): Payer: 59 | Admitting: Nurse Practitioner

## 2020-12-10 ENCOUNTER — Other Ambulatory Visit: Payer: Self-pay

## 2020-12-10 ENCOUNTER — Ambulatory Visit (INDEPENDENT_AMBULATORY_CARE_PROVIDER_SITE_OTHER): Payer: 59

## 2020-12-10 ENCOUNTER — Encounter: Payer: Self-pay | Admitting: Hospice and Palliative Medicine

## 2020-12-10 ENCOUNTER — Encounter (INDEPENDENT_AMBULATORY_CARE_PROVIDER_SITE_OTHER): Payer: Self-pay | Admitting: Nurse Practitioner

## 2020-12-10 VITALS — BP 118/80 | HR 80 | Ht 67.0 in | Wt 333.0 lb

## 2020-12-10 DIAGNOSIS — M79605 Pain in left leg: Secondary | ICD-10-CM

## 2020-12-10 DIAGNOSIS — I731 Thromboangiitis obliterans [Buerger's disease]: Secondary | ICD-10-CM

## 2020-12-10 DIAGNOSIS — I744 Embolism and thrombosis of arteries of extremities, unspecified: Secondary | ICD-10-CM

## 2020-12-10 DIAGNOSIS — M79604 Pain in right leg: Secondary | ICD-10-CM

## 2020-12-10 DIAGNOSIS — I10 Essential (primary) hypertension: Secondary | ICD-10-CM

## 2020-12-15 ENCOUNTER — Encounter: Payer: Self-pay | Admitting: Hospice and Palliative Medicine

## 2020-12-16 ENCOUNTER — Encounter (INDEPENDENT_AMBULATORY_CARE_PROVIDER_SITE_OTHER): Payer: 59

## 2020-12-16 ENCOUNTER — Ambulatory Visit (INDEPENDENT_AMBULATORY_CARE_PROVIDER_SITE_OTHER): Payer: 59 | Admitting: Vascular Surgery

## 2020-12-17 ENCOUNTER — Encounter: Payer: Self-pay | Admitting: Hospice and Palliative Medicine

## 2020-12-17 ENCOUNTER — Other Ambulatory Visit: Payer: Self-pay | Admitting: Hospice and Palliative Medicine

## 2020-12-17 DIAGNOSIS — I744 Embolism and thrombosis of arteries of extremities, unspecified: Secondary | ICD-10-CM

## 2020-12-18 ENCOUNTER — Ambulatory Visit (INDEPENDENT_AMBULATORY_CARE_PROVIDER_SITE_OTHER): Payer: Managed Care, Other (non HMO) | Admitting: Hospice and Palliative Medicine

## 2020-12-18 ENCOUNTER — Encounter: Payer: Self-pay | Admitting: Hospice and Palliative Medicine

## 2020-12-18 VITALS — Resp 16 | Ht 67.0 in

## 2020-12-18 DIAGNOSIS — D735 Infarction of spleen: Secondary | ICD-10-CM

## 2020-12-18 DIAGNOSIS — I744 Embolism and thrombosis of arteries of extremities, unspecified: Secondary | ICD-10-CM | POA: Diagnosis not present

## 2020-12-18 DIAGNOSIS — Z79899 Other long term (current) drug therapy: Secondary | ICD-10-CM

## 2020-12-18 DIAGNOSIS — R5383 Other fatigue: Secondary | ICD-10-CM

## 2020-12-18 DIAGNOSIS — H9193 Unspecified hearing loss, bilateral: Secondary | ICD-10-CM
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
## 2020-12-19 ENCOUNTER — Encounter: Payer: Self-pay | Admitting: Hospice and Palliative Medicine

## 2020-12-22 ENCOUNTER — Encounter: Payer: Self-pay | Admitting: Hospice and Palliative Medicine

## 2020-12-22 ENCOUNTER — Encounter (INDEPENDENT_AMBULATORY_CARE_PROVIDER_SITE_OTHER): Payer: Self-pay | Admitting: Nurse Practitioner

## 2020-12-22 NOTE — Progress Notes (Signed)
Subjective:    Patient ID: [name], female    DOB: 12-28-1965, 55 y.o.   MRN: [id number] No chief complaint on file.   The patient presents today for follow-up studies regarding lower extremity pain and discomfort.  She has had this pain in her bilateral lower extremities for some time.  She notes that she has pain behind her knees and that she also has pain in her knees making it difficult for her to walk.  She notes that the pain tends to feel better if she rubs.  The patient also endorses not being very active recently however she hopes to begin a structured activity soon.  Today noninvasive studies show an ABI of 1.23 on the right and 1.00 on the left.  She has a TBI of 1.04 on the right and 1.36 on the left.  She also has triphasic tibial artery waveforms bilaterally with good toe waveforms bilaterally.  The patient also had evidence of deep venous insufficiency in her bilateral lower extremities.  Her right lower extremity she had reflux in the great saphenous vein at the saphenofemoral junction.  The left she had reflux in the great saphenous vein at the proximal thigh and mid thigh.  No evidence of DVT or superficial venous thrombosis.   Review of Systems  Musculoskeletal: Positive for arthralgias.  All other systems reviewed and are negative.      Objective:   Physical Exam Vitals reviewed.  Constitutional:      Appearance: She is obese.  HENT:     Head: Normocephalic.  Cardiovascular:     Rate and Rhythm: Normal rate.  Pulmonary:     Effort: Pulmonary effort is normal.  Skin:    General: Skin is warm and dry.  Neurological:     Mental Status: She is alert and oriented to person, place, and time.     Motor: Weakness present.     Gait: Gait abnormal.  Psychiatric:        Mood and Affect: Mood normal.        Behavior: Behavior normal.        Thought Content: Thought content normal.        Judgment: Judgment normal.     BP 118/80   Pulse 80   Ht 5\' 7"   (1.702 m)   Wt (!) 333 lb (151 kg)   BMI 52.16 kg/m   Past Medical History:  Diagnosis Date  . Berger's disease 2011  . Buerger's disease ([place])   . Family history of adverse reaction to anesthesia    sister and daughter difficult to wake up  . Folic acid deficiency   . GERD (gastroesophageal reflux disease)   . Hypertension   . Hypothyroidism   . IDA (iron deficiency anemia)   . Thrombocytosis     Social History   Socioeconomic History  . Marital status: Married    Spouse name: Not on file  . Number of children: Not on file  . Years of education: Not on file  . Highest education level: Not on file  Occupational History  . Not on file  Tobacco Use  . Smoking status: Former Smoker    Types: Cigarettes    Quit date: 04/28/2010    Years since quitting: 10.6  . Smokeless tobacco: Never Used  Substance and Sexual Activity  . Alcohol use: No    Alcohol/week: 0.0 standard drinks  . Drug use: No  . Sexual activity: Yes    Birth control/protection: None  Other Topics Concern  . Not on file  Social History Narrative  . Not on file   Social Determinants of Health   Financial Resource Strain: Not on file  Food Insecurity: Not on file  Transportation Needs: Not on file  Physical Activity: Not on file  Stress: Not on file  Social Connections: Not on file  Intimate Partner Violence: Not on file    Past Surgical History:  Procedure Laterality Date  . ADENOIDECTOMY     Small child  . APPENDECTOMY     Age 68  . COLON SURGERY  1980   ileostomy and internal pouch age 84  . DILATATION & CURETTAGE/HYSTEROSCOPY WITH MYOSURE N/A 05/02/2015   Procedure: DILATATION & CURETTAGE/HYSTEROSCOPY WITH MYOSURE;  Surgeon: Malachy Mood, MD;  Location: [facility] ORS;  Service: Gynecology;  Laterality: N/A;  . LAPAROSCOPIC GASTRIC SLEEVE RESECTION  07/06/2016    Family History  Problem Relation Age of Onset  . Hypertension Mother   . Congestive Heart Failure Father   . Hypertension  Father   . Congestive Heart Failure Maternal Grandmother   . Colon cancer Maternal Grandfather 51  . Skin cancer Maternal Aunt        Basal cell  . Skin cancer Maternal Uncle        Basal cell  . Skin cancer Cousin        Basal Cell    Allergies  Allergen Reactions  . Tape Rash    Some tapes cause a rash, possible the paper tape.    CBC Latest Ref Rng & Units 09/03/2020 04/28/2020 04/02/2020  WBC 4.0 - 10.5 K/uL 12.4(H) - 10.2  Hemoglobin 12.0 - 15.0 g/dL 14.9 - 15.7  Hematocrit 36.0 - 46.0 % 44.7 48.2(H) 46.5  Platelets 150 - 400 K/uL 312 - 345      CMP     Component Value Date/Time   NA 136 04/28/2020 1159   NA 139 04/02/2020 1519   K 3.6 04/28/2020 1159   CL 100 04/28/2020 1159   CO2 24 04/28/2020 1159   GLUCOSE 103 (H) 04/28/2020 1159   BUN 14 04/28/2020 1159   BUN 10 04/02/2020 1519   CREATININE 0.70 09/03/2020 1057   CALCIUM 9.3 04/28/2020 1159   PROT 7.8 04/02/2020 1519   ALBUMIN 4.3 04/02/2020 1519   AST 29 04/02/2020 1519   ALT 30 04/02/2020 1519   ALKPHOS 115 04/02/2020 1519   BILITOT 0.8 04/02/2020 1519   GFRNONAA >60 04/28/2020 1159   GFRAA >60 04/28/2020 1159     VAS [place] ABI WITH/WO TBI  Result Date: 12/16/2020 LOWER EXTREMITY DOPPLER STUDY Indications: Rest pain. High Risk Factors: Hypertension.  Performing Technologist: Charlane Ferretti RT (R)(VS)  Examination Guidelines: A complete evaluation includes at minimum, Doppler waveform signals and systolic blood pressure reading at the level of bilateral brachial, anterior tibial, and posterior tibial arteries, when vessel segments are accessible. Bilateral testing is considered an integral part of a complete examination. Photoelectric Plethysmograph (PPG) waveforms and toe systolic pressure readings are included as required and additional duplex testing as needed. Limited examinations for reoccurring indications may be performed as noted.  ABI Findings: +---------+------------------+-----+---------+--------+  Right    Rt Pressure (mmHg)IndexWaveform Comment  +---------+------------------+-----+---------+--------+ Brachial 125                                      +---------+------------------+-----+---------+--------+ ATA      146  1.07 triphasic         +---------+------------------+-----+---------+--------+ PTA      167               1.23 triphasic         +---------+------------------+-----+---------+--------+ Great Toe141               1.04 Normal            +---------+------------------+-----+---------+--------+ +---------+------------------+-----+---------+-------+ Left     Lt Pressure (mmHg)IndexWaveform Comment +---------+------------------+-----+---------+-------+ Brachial 136                                     +---------+------------------+-----+---------+-------+ ATA      136               1.00 triphasic        +---------+------------------+-----+---------+-------+ PTA      125               0.92 triphasic        +---------+------------------+-----+---------+-------+ Great Toe185               1.36 Normal           +---------+------------------+-----+---------+-------+ Summary: Right: Resting right ankle-brachial index is within normal range. No evidence of significant right lower extremity arterial disease. The right toe-brachial index is normal. Left: Resting left ankle-brachial index is within normal range. No evidence of significant left lower extremity arterial disease. The left toe-brachial index is normal. *See table(s) above for measurements and observations.  Electronically signed by Leotis Pain MD on 12/16/2020 at 8:45:54 AM.   Final        Assessment & Plan:   1. Pain in both lower extremities Based upon the patient's description of pain as well as her noninvasive studies today, the pain is likely related to either arthritic changes or changes from lack of activity.  The patient will be getting physical therapy soon and is  hopeful that this will help many of her symptoms as well.  Patient will continue to follow-up with [place] on an as-needed basis.  2. Primary hypertension Blood pressure under good control today.  No medication changes necessary.  3. 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  The patient was hospitalized for a sudden splenic infarct and she was anticoagulated but she also had positive blood cultures and received extensive IV antibiotics.  Since that time the patient notes that many issues resolved and she also feels much better.   Current Outpatient Medications on File Prior to Visit  Medication Sig Dispense Refill  . amLODipine (NORVASC) 10 MG tablet Take 1 tablet (10 mg total) by mouth at bedtime. 90 tablet 3  . atorvastatin (LIPITOR) 40 MG tablet     . cilostazol (PLETAL) 50 MG tablet Take 50 mg by mouth 2 (two) times daily.    . Cranberry 1000 MG CAPS Take 1 tablet by mouth daily.    . CVS ASPIRIN LOW DOSE 81 MG EC tablet SMARTSIG:Tablet(s) By Mouth    . cyanocobalamin 1000 MCG tablet Take by mouth.    . famotidine (PEPCID) 20 MG tablet TAKE ONE TABLET BY MOUTH TWICE A DAY 60 tablet 3  . fluconazole (DIFLUCAN) 150 MG tablet Take one tab a day x 3 days and then as needed for next 4 weeks 7 tablet 1  .  folic acid (FOLVITE) 1 MG tablet Take 1 tablet (1 mg total) by mouth daily. 90 tablet 3  . levonorgestrel (MIRENA) 20 MCG/24HR IUD by Intrauterine route.    Marland Kitchen levothyroxine (SYNTHROID) 200 MCG tablet take 200 mcg po six days a week and on saturdays take 1/2 tablet PO. 90 tablet 3  . Multiple Vitamins-Minerals (VITAMIN D3 COMPLETE PO) Take 1 capsule by mouth 2 (two) times a week.    . nystatin cream (MYCOSTATIN) Apply 1 application topically 2 (two) times daily. 30 g 1  . pantoprazole (PROTONIX) 40 MG tablet Take 1 tablet (40 mg total) by mouth 2 (two) times daily. 180 tablet 3  .  triamcinolone (KENALOG) 0.1 % Apply 1 application topically 2 (two) times daily. 30 g 1  . Vitamin D, Ergocalciferol, (DRISDOL) 1.25 MG (50000 UNIT) CAPS capsule TAKE 1 CAPSULE ONCE A WEEK 12 capsule 3   No current facility-administered medications on file prior to visit.    There are no Patient Instructions on file for this visit. No follow-ups on file.   Kris Hartmann, NP

## 2020-12-23 ENCOUNTER — Encounter: Payer: Self-pay | Admitting: Hospice and Palliative Medicine

## 2020-12-24 ENCOUNTER — Encounter: Payer: Self-pay | Admitting: Internal Medicine

## 2020-12-24 ENCOUNTER — Inpatient Hospital Stay: Payer: 59

## 2020-12-24 ENCOUNTER — Other Ambulatory Visit: Payer: Self-pay

## 2020-12-24 ENCOUNTER — Inpatient Hospital Stay: Payer: 59 | Attending: Internal Medicine | Admitting: Internal Medicine

## 2020-12-24 ENCOUNTER — Encounter: Payer: Self-pay | Admitting: Hospice and Palliative Medicine

## 2020-12-24 VITALS — BP 137/85 | HR 77 | Temp 97.1°F | Resp 20 | Ht 67.0 in | Wt 333.0 lb

## 2020-12-24 DIAGNOSIS — M2550 Pain in unspecified joint: Secondary | ICD-10-CM | POA: Diagnosis not present

## 2020-12-24 DIAGNOSIS — D735 Infarction of spleen: Secondary | ICD-10-CM | POA: Insufficient documentation

## 2020-12-24 DIAGNOSIS — G8929 Other chronic pain: Secondary | ICD-10-CM | POA: Diagnosis not present

## 2020-12-24 DIAGNOSIS — Z8249 Family history of ischemic heart disease and other diseases of the circulatory system: Secondary | ICD-10-CM | POA: Insufficient documentation

## 2020-12-24 DIAGNOSIS — D6852 Prothrombin gene mutation: Secondary | ICD-10-CM | POA: Insufficient documentation

## 2020-12-24 DIAGNOSIS — Z800 Family history of malignant neoplasm of digestive organs: Secondary | ICD-10-CM | POA: Insufficient documentation

## 2020-12-24 DIAGNOSIS — I10 Essential (primary) hypertension: Secondary | ICD-10-CM | POA: Insufficient documentation

## 2020-12-24 DIAGNOSIS — Z87891 Personal history of nicotine dependence: Secondary | ICD-10-CM | POA: Insufficient documentation

## 2020-12-24 DIAGNOSIS — M549 Dorsalgia, unspecified: Secondary | ICD-10-CM | POA: Insufficient documentation

## 2020-12-24 DIAGNOSIS — Z808 Family history of malignant neoplasm of other organs or systems: Secondary | ICD-10-CM | POA: Diagnosis not present

## 2020-12-24 DIAGNOSIS — Z888 Allergy status to other drugs, medicaments and biological substances status: Secondary | ICD-10-CM | POA: Diagnosis not present

## 2020-12-24 DIAGNOSIS — Z79899 Other long term (current) drug therapy: Secondary | ICD-10-CM | POA: Diagnosis not present

## 2020-12-24 DIAGNOSIS — K219 Gastro-esophageal reflux disease without esophagitis: Secondary | ICD-10-CM | POA: Diagnosis not present

## 2020-12-24 DIAGNOSIS — R1012 Left upper quadrant pain: Secondary | ICD-10-CM | POA: Insufficient documentation

## 2020-12-24 DIAGNOSIS — I2699 Other pulmonary embolism without acute cor pulmonale: Secondary | ICD-10-CM | POA: Insufficient documentation

## 2020-12-24 DIAGNOSIS — E783 Hyperchylomicronemia: Secondary | ICD-10-CM | POA: Diagnosis not present

## 2020-12-24 DIAGNOSIS — Z793 Long term (current) use of hormonal contraceptives: Secondary | ICD-10-CM | POA: Insufficient documentation

## 2020-12-24 DIAGNOSIS — E039 Hypothyroidism, unspecified: Secondary | ICD-10-CM | POA: Diagnosis not present

## 2020-12-24 DIAGNOSIS — R109 Unspecified abdominal pain: Secondary | ICD-10-CM | POA: Diagnosis not present

## 2020-12-24 DIAGNOSIS — Z9049 Acquired absence of other specified parts of digestive tract: Secondary | ICD-10-CM | POA: Diagnosis not present

## 2020-12-24 DIAGNOSIS — R5383 Other fatigue: Secondary | ICD-10-CM | POA: Insufficient documentation

## 2020-12-24 LAB — VITAMIN D 25 HYDROXY (VIT D DEFICIENCY, FRACTURES): Vit D, 25-Hydroxy: 55.19 ng/mL (ref 30–100)

## 2020-12-24 LAB — CBC WITH DIFFERENTIAL/PLATELET
Abs Immature Granulocytes: 0.04 10*3/uL (ref 0.00–0.07)
Basophils Absolute: 0 10*3/uL (ref 0.0–0.1)
Basophils Relative: 0 %
Eosinophils Absolute: 0.2 10*3/uL (ref 0.0–0.5)
Eosinophils Relative: 1 %
HCT: 43.4 % (ref 36.0–46.0)
Hemoglobin: 14.6 g/dL (ref 12.0–15.0)
Immature Granulocytes: 0 %
Lymphocytes Relative: 32 %
Lymphs Abs: 3.4 10*3/uL (ref 0.7–4.0)
MCH: 31.1 pg (ref 26.0–34.0)
MCHC: 33.6 g/dL (ref 30.0–36.0)
MCV: 92.5 fL (ref 80.0–100.0)
Monocytes Absolute: 0.7 10*3/uL (ref 0.1–1.0)
Monocytes Relative: 7 %
Neutro Abs: 6.2 10*3/uL (ref 1.7–7.7)
Neutrophils Relative %: 60 %
Platelets: 302 10*3/uL (ref 150–400)
RBC: 4.69 MIL/uL (ref 3.87–5.11)
RDW: 13.8 % (ref 11.5–15.5)
WBC: 10.5 10*3/uL (ref 4.0–10.5)
nRBC: 0 % (ref 0.0–0.2)

## 2020-12-24 LAB — COMPREHENSIVE METABOLIC PANEL
ALT: 18 U/L (ref 0–44)
AST: 20 U/L (ref 15–41)
Albumin: 4 g/dL (ref 3.5–5.0)
Alkaline Phosphatase: 91 U/L (ref 38–126)
Anion gap: 8 (ref 5–15)
BUN: 13 mg/dL (ref 6–20)
CO2: 25 mmol/L (ref 22–32)
Calcium: 9 mg/dL (ref 8.9–10.3)
Chloride: 103 mmol/L (ref 98–111)
Creatinine, Ser: 0.71 mg/dL (ref 0.44–1.00)
GFR, Estimated: >60 mL/min (ref >60)
Glucose, Bld: 93 mg/dL (ref 70–99)
Potassium: 3.9 mmol/L (ref 3.5–5.1)
Sodium: 136 mmol/L (ref 135–145)
Total Bilirubin: 0.7 mg/dL (ref 0.3–1.2)
Total Protein: 8 g/dL (ref 6.5–8.1)

## 2020-12-24 LAB — PROTIME-INR
INR: 1 (ref 0.8–1.2)
Prothrombin Time: 13.1 seconds (ref 11.4–15.2)

## 2020-12-24 LAB — T4, FREE: Free T4: 1.57 ng/dL — ABNORMAL HIGH (ref 0.61–1.12)

## 2020-12-24 LAB — TSH: TSH: 1.314 u[IU]/mL (ref 0.350–4.500)

## 2020-12-24 LAB — APTT: aPTT: <24 seconds — ABNORMAL LOW (ref 24–36)

## 2020-12-24 LAB — VITAMIN B12: Vitamin B-12: 951 pg/mL — ABNORMAL HIGH (ref 180–914)

## 2020-12-24 LAB — FIBRIN DERIVATIVES D-DIMER (ARMC ONLY): Fibrin derivatives D-dimer (ARMC): 789.98 ng/mL (FEU) — ABNORMAL HIGH (ref 0.00–499.00)

## 2020-12-24 NOTE — Assessment & Plan Note (Addendum)
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  Status post Lovenox x1 week in hospital.  No anticoagulation at discharge.  Continue aspirin plus Pletal [see below]  #Patient clinically asymptomatic at this time.  I would not recommend initiation of anticoagulation at this time especially the etiology is unclear; and the patient symptoms resolved.  However I discussed with the patient that if she has recurrent episodes of splenic infarcts/or any other thromboembolic event, she would be candidate for systemic anticoagulation.  However I would recommend further hypercoagulable work-up including-antiphospholipid antibody syndrome work-up PT PTT CBC CMP [labs as requested by PCP].  D-dimer levels.  # ? Burger's disease involving upper extremities-currently asymptomatic [quit smoking]; on Pletal plus asprin. Followed by hematology.  Thank you, Ms.Harris for allowing me to participate in the care of your pleasant patient. Please do not hesitate to contact me with questions or concerns in the interim.  # DISPOSITION: will with results. # labs today- # follow up TBD- Dr.B

## 2020-12-24 NOTE — Progress Notes (Signed)
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 hospital];lovenox SQ x1 week; NO ANTICOAGULATION at discharge. TEE-rule out endocarditis negative; coag negative staph-likely contaminant.  Vascular surgery/rheumatology evaluation-? PAN-CT negative [given hx of Buerger's disease]; protein C&S- WNL.  # ?  Buerger's disease [Dr.Pandit; rheumatology [facility]; Dr.Dew-currently non-smoker] # 2011-prothrombin gene mutation/factor V Leiden normal. # COVID-19 [Oct 2020];   # UC s/p pancolectomy [as [facility]]; gastric sleeve surgery-2017.   Oncology History   No history exists.     HISTORY OF PRESENTING ILLNESS:  [name] 55 y.o.  female patient to morbidly obese, multiple medical problems including-small intestinal bacterial overgrowth, ?  Buerger's disease has been referred to [place] for for further evaluation work-up for splenic infarct.  Patient states that she has chronic abdominal pain which she attributes to small intestinal bacterial overgrowth syndrome.  However, middle December 2021 noted to have significant worsening epigastric/left upper quadrant pain which led to hospitalizations at [facility].  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  As per the patient she was treated with Lovenox during hospitalization for 1 week.  Patient was evaluated by vascular surgery/and also rheumatology.  Recommended outpatient hematology evaluation.  Patient had extensive work-up-cause of infarct unknown.  Patient was discharged without any anticoagulation.   Currently patient denies any abdominal pain.  Denies any nausea vomiting.  She is currently back to baseline.  Denies any prior history of blood clots-DVT or PE.  Reviewed the records at  length-note/labs from December 15 through December 03, 2020 hospitalization at [facility] summarized above.   Review of Systems  Constitutional: Positive for malaise/fatigue. Negative for chills, diaphoresis, fever and weight loss.  HENT: Negative for nosebleeds and sore throat.   Eyes: Negative for double vision.  Respiratory: Negative for cough, hemoptysis, sputum production, shortness of breath and wheezing.   Cardiovascular: Negative for chest pain, palpitations, orthopnea and leg swelling.  Gastrointestinal: Negative for abdominal pain, blood in stool, constipation, diarrhea, heartburn, melena, nausea and vomiting.  Genitourinary: Negative for dysuria, frequency and urgency.  Musculoskeletal: Positive for back pain and joint pain.  Skin: Negative.  Negative for itching and rash.  Neurological: Negative for dizziness, tingling, focal weakness, weakness and headaches.  Endo/Heme/Allergies: Does not bruise/bleed easily.  Psychiatric/Behavioral: Negative for depression. The patient is not nervous/anxious and does not have insomnia.      MEDICAL HISTORY:  Past Medical History:  Diagnosis Date  . Berger's disease 2011  . Buerger's disease (HCC)   . Family history of adverse reaction to anesthesia    sister and daughter difficult to wake up  . Folic acid deficiency   . GERD (gastroesophageal reflux disease)   . Hypertension   . Hypothyroidism   . IDA (iron deficiency anemia)   . Thrombocytosis     SURGICAL HISTORY: Past Surgical History:  Procedure Laterality Date  . ADENOIDECTOMY     Small child  . APPENDECTOMY     Age 66  . COLON SURGERY  1980   ileostomy and internal pouch age 54  . DILATATION & CURETTAGE/HYSTEROSCOPY WITH MYOSURE N/A 05/02/2015   Procedure: DILATATION & CURETTAGE/HYSTEROSCOPY WITH MYOSURE;  Surgeon: Vena Austria, MD;  Location: [facility] ORS;  Service: Gynecology;  Laterality: N/A;  . LAPAROSCOPIC GASTRIC SLEEVE RESECTION  07/06/2016  SOCIAL  HISTORY: Social History   Socioeconomic History  . Marital status: Married    Spouse name: Not on file  . Number of children: Not on file  . Years of education: Not on file  . Highest education level: Not on file  Occupational History  . Not on file  Tobacco Use  . Smoking status: Former Smoker    Types: Cigarettes    Quit date: 04/28/2010    Years since quitting: 10.6  . Smokeless tobacco: Never Used  Substance and Sexual Activity  . Alcohol use: No    Alcohol/week: 0.0 standard drinks  . Drug use: No  . Sexual activity: Yes    Birth control/protection: None  Other Topics Concern  . Not on file  Social History Narrative  . Not on file   Social Determinants of Health   Financial Resource Strain: Not on file  Food Insecurity: Not on file  Transportation Needs: Not on file  Physical Activity: Not on file  Stress: Not on file  Social Connections: Not on file  Intimate Partner Violence: Not on file    FAMILY HISTORY: Family History  Problem Relation Age of Onset  . Hypertension Mother   . Congestive Heart Failure Father   . Hypertension Father   . Congestive Heart Failure Maternal Grandmother   . Colon cancer Maternal Grandfather 60  . Skin cancer Maternal Aunt        Basal cell  . Skin cancer Maternal Uncle        Basal cell  . Skin cancer Cousin        Basal Cell    ALLERGIES:  is allergic to tape.  MEDICATIONS:  Current Outpatient Medications  Medication Sig Dispense Refill  . amLODipine (NORVASC) 10 MG tablet Take 1 tablet (10 mg total) by mouth at bedtime. 90 tablet 3  . cilostazol (PLETAL) 50 MG tablet Take 50 mg by mouth 2 (two) times daily.    . Cranberry 1000 MG CAPS Take 1 tablet by mouth daily.    . [employer] ASPIRIN LOW DOSE 81 MG EC tablet Take 162 mg by mouth daily.    . cyanocobalamin 1000 MCG tablet Take 1,000 mcg by mouth every other day.    . famotidine (PEPCID) 20 MG tablet TAKE ONE TABLET BY MOUTH TWICE A DAY 60 tablet 3  . folic acid  (FOLVITE) 1 MG tablet Take 1 tablet (1 mg total) by mouth daily. 90 tablet 3  . levonorgestrel (MIRENA) 20 MCG/24HR IUD by Intrauterine route.    Marland Kitchen levothyroxine (SYNTHROID) 200 MCG tablet take 200 mcg po six days a week and on saturdays take 1/2 tablet PO. 90 tablet 3  . pantoprazole (PROTONIX) 40 MG tablet Take 1 tablet (40 mg total) by mouth 2 (two) times daily. 180 tablet 3  . Vitamin D, Ergocalciferol, (DRISDOL) 1.25 MG (50000 UNIT) CAPS capsule TAKE 1 CAPSULE ONCE A WEEK 12 capsule 3  . atorvastatin (LIPITOR) 40 MG tablet  (Patient not taking: Reported on 12/24/2020)     No current facility-administered medications for this visit.      Marland Kitchen  PHYSICAL EXAMINATION:  Vitals:   12/24/20 1411 12/24/20 1415  BP:  137/85  Pulse:  77  Resp: 20   Temp:  (!) 97.1 F (36.2 C)   Filed Weights   12/24/20 1411  Weight: (!) 333 lb (151 kg)    Physical Exam Constitutional:      Comments: Patient morbidly obese.  She is in a wheelchair.  Accompanied by family.  HENT:     Head: Normocephalic and atraumatic.     Mouth/Throat:     Mouth: Oropharynx is clear and moist.     Pharynx: No oropharyngeal exudate.  Eyes:     Pupils: Pupils are equal, round, and reactive to light.  Cardiovascular:     Rate and Rhythm: Normal rate and regular rhythm.  Pulmonary:     Effort: No respiratory distress.     Breath sounds: No wheezing.     Comments: Decreased breath sounds bilateral bases.  No wheeze or crackles. Abdominal:     General: Bowel sounds are normal. There is no distension.     Palpations: Abdomen is soft. There is no mass.     Tenderness: There is no abdominal tenderness. There is no guarding or rebound.  Musculoskeletal:        General: No tenderness or edema. Normal range of motion.     Cervical back: Normal range of motion and neck supple.  Skin:    General: Skin is warm.  Neurological:     Mental Status: She is alert and oriented to person, place, and time.  Psychiatric:         Mood and Affect: Affect normal.      LABORATORY DATA:  I have reviewed the data as listed Lab Results  Component Value Date   WBC 10.5 12/24/2020   HGB 14.6 12/24/2020   HCT 43.4 12/24/2020   MCV 92.5 12/24/2020   PLT 302 12/24/2020   Recent Labs    03/05/20 1040 04/02/20 1519 04/28/20 1159 09/03/20 1057 12/24/20 1540  NA 138 139 136  --  136  K 3.6 4.4 3.6  --  3.9  CL 103 101 100  --  103  CO2 25 20 24   --  25  GLUCOSE 104* 105* 103*  --  93  BUN 9 10 14   --  13  CREATININE 0.77 0.80 0.87 0.70 0.71  CALCIUM 9.0 9.5 9.3  --  9.0  GFRNONAA >60 86 >60  --  >60  GFRAA >60 99 >60  --   --   PROT 8.5* 7.8  --   --  8.0  ALBUMIN 4.2 4.3  --   --  4.0  AST 32 29  --   --  20  ALT 32 30  --   --  18  ALKPHOS 89 115  --   --  91  BILITOT 1.0 0.8  --   --  0.7    RADIOGRAPHIC STUDIES: I have personally reviewed the radiological images as listed and agreed with the findings in the report. VAS [place] ABI WITH/WO TBI  Result Date: 12/16/2020 LOWER EXTREMITY DOPPLER STUDY Indications: Rest pain. High Risk Factors: Hypertension.  Performing Technologist: Reece Agar RT (R)(VS)  Examination Guidelines: A complete evaluation includes at minimum, Doppler waveform signals and systolic blood pressure reading at the level of bilateral brachial, anterior tibial, and posterior tibial arteries, when vessel segments are accessible. Bilateral testing is considered an integral part of a complete examination. Photoelectric Plethysmograph (PPG) waveforms and toe systolic pressure readings are included as required and additional duplex testing as needed. Limited examinations for reoccurring indications may be performed as noted.  ABI Findings: +---------+------------------+-----+---------+--------+ Right    Rt Pressure (mmHg)IndexWaveform Comment  +---------+------------------+-----+---------+--------+ Brachial 125                                       +---------+------------------+-----+---------+--------+  ATA      146               1.07 triphasic         +---------+------------------+-----+---------+--------+ PTA      167               1.23 triphasic         +---------+------------------+-----+---------+--------+ Great Toe141               1.04 Normal            +---------+------------------+-----+---------+--------+ +---------+------------------+-----+---------+-------+ Left     Lt Pressure (mmHg)IndexWaveform Comment +---------+------------------+-----+---------+-------+ Brachial 136                                     +---------+------------------+-----+---------+-------+ ATA      136               1.00 triphasic        +---------+------------------+-----+---------+-------+ PTA      125               0.92 triphasic        +---------+------------------+-----+---------+-------+ Great Toe185               1.36 Normal           +---------+------------------+-----+---------+-------+ Summary: Right: Resting right ankle-brachial index is within normal range. No evidence of significant right lower extremity arterial disease. The right toe-brachial index is normal. Left: Resting left ankle-brachial index is within normal range. No evidence of significant left lower extremity arterial disease. The left toe-brachial index is normal. *See table(s) above for measurements and observations.  Electronically signed by Festus Barren MD on 12/16/2020 at 8:45:54 AM.   Final    VAS [place] LOWER EXTREMITY VENOUS REFLUX  Result Date: 12/16/2020  Lower Venous Reflux Study Indications: Pain, Swelling, and pulmonary embolism.  Performing Technologist: Reece Agar RT (R)(VS)  Examination Guidelines: A complete evaluation includes B-mode imaging, spectral Doppler, color Doppler, and power Doppler as needed of all accessible portions of each vessel. Bilateral testing is considered an integral part of a complete examination. Limited  examinations for reoccurring indications may be performed as noted. The reflux portion of the exam is performed with the patient in reverse Trendelenburg. Significant venous reflux is defined as >500 ms in the superficial venous system, and >1 second in the deep venous system.  Venous Reflux Times +--------------+---------+----------+-----------+------------+--------+ RIGHT         Reflux NoReflux YesReflux TimeDiameter cmsComments +--------------+---------+----------+-----------+------------+--------+ CFV                       yes      4966 ms                       +--------------+---------+----------+-----------+------------+--------+ FV mid        no                                                 +--------------+---------+----------+-----------+------------+--------+ GSV at SFJ                yes      799 ms       .60              +--------------+---------+----------+-----------+------------+--------+ GSV  prox thighno                                .36              +--------------+---------+----------+-----------+------------+--------+ GSV mid thigh no                                .37              +--------------+---------+----------+-----------+------------+--------+ GSV dist thighno                                .38              +--------------+---------+----------+-----------+------------+--------+ GSV at knee   no                                .30              +--------------+---------+----------+-----------+------------+--------+ GSV prox calf                                   .33              +--------------+---------+----------+-----------+------------+--------+ SSV Pop Fossa no                                .18              +--------------+---------+----------+-----------+------------+--------+  +--------------+---------+----------+-----------+------------+--------+ LEFT          Reflux NoReflux YesReflux TimeDiameter  cmsComments +--------------+---------+----------+-----------+------------+--------+ CFV                       yes      1724 ms                       +--------------+---------+----------+-----------+------------+--------+ FV mid                    yes      2670 ms                       +--------------+---------+----------+-----------+------------+--------+ Popliteal                 yes      2648 ms                       +--------------+---------+----------+-----------+------------+--------+ GSV at SFJ    no                                .65              +--------------+---------+----------+-----------+------------+--------+ GSV prox thigh            yes      755 ms       .46              +--------------+---------+----------+-----------+------------+--------+ GSV mid thigh             yes      660 ms       .41              +--------------+---------+----------+-----------+------------+--------+  GSV dist thighno                                .30              +--------------+---------+----------+-----------+------------+--------+ GSV at knee   no                                .24              +--------------+---------+----------+-----------+------------+--------+ GSV prox calf                                   .31              +--------------+---------+----------+-----------+------------+--------+ SSV Pop Fossa no                                .25              +--------------+---------+----------+-----------+------------+--------+ Summary: Bilateral: - No evidence of deep vein thrombosis seen in the lower extremities, bilaterally, from the common femoral through the popliteal veins. - No evidence of superficial venous thrombosis in the lower extremities, bilaterally.  Right: - Venous reflux is noted in the right common femoral vein. - Venous reflux is noted in the right sapheno-femoral junction.  Left: - Venous reflux is noted in the left common  femoral vein. - Venous reflux is noted in the left sapheno-femoral junction. - Venous reflux is noted in the left greater saphenous vein in the thigh. - Venous reflux is noted in the left popliteal vein.  *See table(s) above for measurements and observations. Electronically signed by Festus Barren MD on 12/16/2020 at 8:45:59 AM.    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  Status post Lovenox x1 week in hospital.  No anticoagulation at discharge.  Continue aspirin plus Pletal [see below]  #Patient clinically asymptomatic at this time.  I would not recommend initiation of anticoagulation at this time especially the etiology is unclear; and the patient symptoms resolved.  However I discussed with the patient that if she has recurrent episodes of splenic infarcts/or any other thromboembolic event, she would be candidate for systemic anticoagulation.  However I would recommend further hypercoagulable work-up including-antiphospholipid antibody syndrome work-up PT PTT CBC CMP [labs as requested by PCP].  D-dimer levels.  # ? Burger's disease involving upper extremities-currently asymptomatic [quit smoking]; on Pletal plus asprin. Followed by hematology.  Thank you, Ms.Harris for allowing me to participate in the care of your pleasant patient. Please do not hesitate to contact me with questions or concerns in the interim.  # DISPOSITION: will with results. # labs today- # follow up TBD- Dr.B  All questions were answered. The patient knows to call the clinic with any problems, questions or concerns.   Earna Coder, MD 12/26/2020 8:04 AM

## 2020-12-25 LAB — BETA-2-GLYCOPROTEIN I ABS, IGG/M/A
Beta-2 Glyco I IgG: <9 GPI IgG units (ref 0–20)
Beta-2-Glycoprotein I IgA: <9 GPI IgA units (ref 0–25)
Beta-2-Glycoprotein I IgM: <9 GPI IgM units (ref 0–32)

## 2020-12-26 LAB — ANTIPHOSPHOLIPID SYNDROME PROF
Anticardiolipin IgG: <9 GPL U/mL (ref 0–14)
Anticardiolipin IgM: <9 MPL U/mL (ref 0–12)
DRVVT: 26.2 s (ref 0.0–47.0)
PTT Lupus Anticoagulant: 22.4 s (ref 0.0–51.9)

## 2020-12-29 ENCOUNTER — Other Ambulatory Visit: Payer: Self-pay | Admitting: Internal Medicine

## 2020-12-29 ENCOUNTER — Telehealth: Payer: Self-pay | Admitting: Internal Medicine

## 2020-12-29 DIAGNOSIS — E538 Deficiency of other specified B group vitamins: Secondary | ICD-10-CM

## 2020-12-29 NOTE — Telephone Encounter (Signed)
On 1/14-I spoke to patient regarding results of the blood work negative for any obvious reason for splenic infarct.  D-dimer slightly elevated nonspecific.  Patient is currently on aspirin/Pletal [?  History of Buerger's disease]-we will continue the same.  As patient is clinically asymptomatic from her splenic infarct-I would not recommend resuming any therapeutic anticoagulation.   Patient will follow-up with [place] as needed.  Otherwise she will continue follow-up with PCP.   FYI-Dr.Khan/Ms.Harris- please follow up on the labs ordered as per your request. GB

## 2020-12-30 ENCOUNTER — Encounter (INDEPENDENT_AMBULATORY_CARE_PROVIDER_SITE_OTHER): Payer: Self-pay

## 2020-12-30 ENCOUNTER — Other Ambulatory Visit: Payer: Self-pay | Admitting: Internal Medicine

## 2020-12-30 DIAGNOSIS — E038 Other specified hypothyroidism: Secondary | ICD-10-CM

## 2020-12-31 ENCOUNTER — Other Ambulatory Visit: Payer: Self-pay

## 2020-12-31 MED ORDER — CVS ASPIRIN LOW DOSE 81 MG PO TBEC: 81.0000 mg | DELAYED_RELEASE_TABLET | Freq: Every day | ORAL | 2 refills | Status: DC

## 2021-01-10 ENCOUNTER — Other Ambulatory Visit: Payer: Self-pay | Admitting: Internal Medicine

## 2021-01-10 DIAGNOSIS — I10 Essential (primary) hypertension: Secondary | ICD-10-CM

## 2021-01-19 ENCOUNTER — Other Ambulatory Visit: Payer: Self-pay | Admitting: Hospice and Palliative Medicine

## 2021-01-20 ENCOUNTER — Other Ambulatory Visit: Payer: Self-pay | Admitting: Hospice and Palliative Medicine

## 2021-01-20 MED ORDER — PANCRELIPASE (LIP-PROT-AMYL) 12000-38000 UNITS PO CPEP: 24000.0000 [IU] | ORAL_CAPSULE | Freq: Three times a day (TID) | ORAL | 1 refills | Status: DC

## 2021-01-21 ENCOUNTER — Encounter: Payer: Self-pay | Admitting: Hospice and Palliative Medicine

## 2021-01-28 ENCOUNTER — Other Ambulatory Visit: Payer: Self-pay

## 2021-01-28 ENCOUNTER — Encounter: Payer: Self-pay | Admitting: Hospice and Palliative Medicine

## 2021-01-28 ENCOUNTER — Telehealth: Payer: Self-pay

## 2021-01-28 MED ORDER — NITROFURANTOIN MONOHYD MACRO 100 MG PO CAPS: 100.0000 mg | ORAL_CAPSULE | Freq: Two times a day (BID) | ORAL | 0 refills | Status: DC

## 2021-01-28 NOTE — Telephone Encounter (Signed)
Pt send mychart message she had UTI and unable to come in as per taylor send macrobid if she is not feeling better need to been seen in person

## 2021-01-29 ENCOUNTER — Other Ambulatory Visit: Payer: Self-pay

## 2021-01-29 DIAGNOSIS — E538 Deficiency of other specified B group vitamins: Secondary | ICD-10-CM

## 2021-01-29 MED ORDER — FOLIC ACID 1 MG PO TABS: 1.0000 mg | ORAL_TABLET | Freq: Every day | ORAL | 3 refills | Status: DC

## 2021-01-30 ENCOUNTER — Encounter: Payer: Self-pay | Admitting: Hospice and Palliative Medicine

## 2021-02-04 ENCOUNTER — Encounter: Payer: Self-pay | Admitting: Internal Medicine

## 2021-02-04 ENCOUNTER — Telehealth: Payer: Self-pay | Admitting: Internal Medicine

## 2021-02-04 DIAGNOSIS — R1012 Left upper quadrant pain: Secondary | ICD-10-CM

## 2021-02-04 NOTE — Telephone Encounter (Signed)
On 2/23 I spoke to pt re: recent worsening abdominal pain; symptoms similar to prior splenic infarct. Reluctant to go to ER.   Recommend immediate evaluation with imaging/labs.  C- schedule STAT CT A/P; labs- cbc/cmp.   Follow up MD; 1-2 days after the CT.  GB

## 2021-02-05 NOTE — Telephone Encounter (Signed)
Per Dr. Rogue Bussing - patient needs IV contrast only

## 2021-02-05 NOTE — Addendum Note (Signed)
Addended by: Gloris Ham on: 02/05/2021 08:33 AM   Modules accepted: Orders

## 2021-02-05 NOTE — Telephone Encounter (Signed)
Pt is stating she has NO colon and can she drink the contrast for this scan, had all kinds of issues not eating well only has small intestines does he need the order changed to w/o?

## 2021-02-05 NOTE — Telephone Encounter (Signed)
She needs to be seen to discuss her in depth concerns. Needs to be a face to face visit.

## 2021-02-06 ENCOUNTER — Other Ambulatory Visit: Payer: Self-pay | Admitting: Internal Medicine

## 2021-02-06 ENCOUNTER — Ambulatory Visit
Admission: RE | Admit: 2021-02-06 | Discharge: 2021-02-06 | Disposition: A | Discharge status: Home / Self Care | Payer: 59 | Source: Ambulatory Visit | Attending: Internal Medicine | Admitting: Internal Medicine

## 2021-02-06 ENCOUNTER — Encounter: Payer: Self-pay | Admitting: Internal Medicine

## 2021-02-06 ENCOUNTER — Telehealth: Payer: Self-pay | Admitting: Internal Medicine

## 2021-02-06 ENCOUNTER — Other Ambulatory Visit: Payer: Self-pay

## 2021-02-06 ENCOUNTER — Inpatient Hospital Stay: Payer: 59 | Attending: Internal Medicine

## 2021-02-06 DIAGNOSIS — Z7982 Long term (current) use of aspirin: Secondary | ICD-10-CM | POA: Insufficient documentation

## 2021-02-06 DIAGNOSIS — Z79899 Other long term (current) drug therapy: Secondary | ICD-10-CM | POA: Insufficient documentation

## 2021-02-06 DIAGNOSIS — R1012 Left upper quadrant pain: Secondary | ICD-10-CM | POA: Diagnosis present

## 2021-02-06 DIAGNOSIS — E783 Hyperchylomicronemia: Secondary | ICD-10-CM | POA: Diagnosis not present

## 2021-02-06 DIAGNOSIS — Z87891 Personal history of nicotine dependence: Secondary | ICD-10-CM | POA: Insufficient documentation

## 2021-02-06 DIAGNOSIS — D735 Infarction of spleen: Secondary | ICD-10-CM | POA: Insufficient documentation

## 2021-02-06 LAB — COMPREHENSIVE METABOLIC PANEL
ALT: 22 U/L (ref 0–44)
AST: 24 U/L (ref 15–41)
Albumin: 4 g/dL (ref 3.5–5.0)
Alkaline Phosphatase: 90 U/L (ref 38–126)
Anion gap: 13 (ref 5–15)
BUN: 15 mg/dL (ref 6–20)
CO2: 22 mmol/L (ref 22–32)
Calcium: 9 mg/dL (ref 8.9–10.3)
Chloride: 100 mmol/L (ref 98–111)
Creatinine, Ser: 0.85 mg/dL (ref 0.44–1.00)
GFR, Estimated: >60 mL/min (ref >60)
Glucose, Bld: 107 mg/dL — ABNORMAL HIGH (ref 70–99)
Potassium: 3.7 mmol/L (ref 3.5–5.1)
Sodium: 135 mmol/L (ref 135–145)
Total Bilirubin: 0.7 mg/dL (ref 0.3–1.2)
Total Protein: 8.1 g/dL (ref 6.5–8.1)

## 2021-02-06 LAB — CBC WITH DIFFERENTIAL/PLATELET
Abs Immature Granulocytes: 0.05 10*3/uL (ref 0.00–0.07)
Basophils Absolute: 0 10*3/uL (ref 0.0–0.1)
Basophils Relative: 0 %
Eosinophils Absolute: 0.2 10*3/uL (ref 0.0–0.5)
Eosinophils Relative: 2 %
HCT: 44.3 % (ref 36.0–46.0)
Hemoglobin: 14.7 g/dL (ref 12.0–15.0)
Immature Granulocytes: 1 %
Lymphocytes Relative: 34 %
Lymphs Abs: 3.4 10*3/uL (ref 0.7–4.0)
MCH: 30.4 pg (ref 26.0–34.0)
MCHC: 33.2 g/dL (ref 30.0–36.0)
MCV: 91.5 fL (ref 80.0–100.0)
Monocytes Absolute: 0.8 10*3/uL (ref 0.1–1.0)
Monocytes Relative: 8 %
Neutro Abs: 5.6 10*3/uL (ref 1.7–7.7)
Neutrophils Relative %: 55 %
Platelets: 309 10*3/uL (ref 150–400)
RBC: 4.84 MIL/uL (ref 3.87–5.11)
RDW: 14.3 % (ref 11.5–15.5)
WBC: 10.1 10*3/uL (ref 4.0–10.5)
nRBC: 0 % (ref 0.0–0.2)

## 2021-02-06 MED ORDER — IOHEXOL 350 MG/ML SOLN
100.0000 mL | Freq: Once | INTRAVENOUS | Status: AC | PRN
  Administered 2021-02-06: 100 mL via INTRAVENOUS

## 2021-02-06 NOTE — Telephone Encounter (Signed)
On 2/25-spoke to patient regarding results of CT scan negative for any acute splenic infarct.  Cannot explain ongoing patient's abdominal pain.  I recommend patient evaluation with GI given patient's complicated GI history..  Patient currently gastroenterologist with [facility]. Defer to PCP regarding referral to GI.   GB  FYI-Dr.Khan- Thanks!

## 2021-02-07 NOTE — Telephone Encounter (Signed)
Thank you :)

## 2021-02-09 ENCOUNTER — Inpatient Hospital Stay (HOSPITAL_BASED_OUTPATIENT_CLINIC_OR_DEPARTMENT_OTHER): Payer: 59 | Admitting: Internal Medicine

## 2021-02-09 ENCOUNTER — Other Ambulatory Visit: Payer: Self-pay

## 2021-02-09 DIAGNOSIS — D735 Infarction of spleen: Secondary | ICD-10-CM

## 2021-02-09 NOTE — Progress Notes (Signed)
I connected with [name] on 02/09/21 at 10:00 AM EST by video enabled telemedicine visit and verified that I am speaking with the correct person using two identifiers.  I discussed the limitations, risks, security and privacy concerns of performing an evaluation and management service by telemedicine and the availability of in-person appointments. I also discussed with the patient that there may be a patient responsible charge related to this service. The patient expressed understanding and agreed to proceed.    Other persons participating in the visit and their role in the encounter: RN/medical reconciliation Patient's location: home Provider's location: office  Oncology History   No history exists.     Chief Complaint: Abdominal pain/splenic infarct   History of present illness:[name] 55 y.o.  female with history of history of splenic infarct December 2021 of unclear etiology currently not on anticoagulation is here for follow-up review results of the CT scan abdomen pelvis.  Patient had complained of worsening left upper quadrant abdominal pain for the last few days to weeks.  This led to a CT scan 3 days ago-showed no acute splenic infarct; showed chronic changes suggestive of splenic infarct.  Otherwise no acute abdominal process.  Patient continues to complain of intermittent abdominal discomfort; difficulty digesting her food.  States abdominal pain is slightly better-since she changed her food habits.  No blood in stools or black or stools.   Observation/objective: Alert & oriented x 3. In No acute distress.   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  Status post Lovenox x1 week in hospital.  No anticoagulation at discharge.  Continue aspirin plus Pletal. REPEAT CT scan [for worsening]- FEB 26th, 2022- NEG for any acute splenic infarct.  Continue to hold off  any anticoagulation.   # Abdominal pain-unclear etiology.  Patient has complicated GI history.  Recommend evaluation with gastroenterologist.  Recommend patient return to PCP for GI referral.   # ? Burger's disease involving upper extremities-currently asymptomatic [quit smoking]; on Pletal plus asprin.   Patient was requested to call PCPs office for further referral/recommendation.  Dr.Khan aware of our recommendations.  Patient is in agreement.  # DISPOSITION: # follow up as needed- Dr.B  Follow-up instructions:  I discussed the assessment and treatment plan with the patient.  The patient was provided an opportunity to ask questions and all were answered.  The patient agreed with the plan and demonstrated understanding of instructions.  The patient was advised to call back or seek an in person evaluation if the symptoms worsen or if the condition fails to improve as anticipated.  Dr. Charlaine [name] [facility] 02/09/2021 11:15 AM

## 2021-02-09 NOTE — Assessment & Plan Note (Addendum)
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  Status post Lovenox x1 week in hospital.  No anticoagulation at discharge.  Continue aspirin plus Pletal. REPEAT CT scan [for worsening]- FEB 26th, 2022- NEG for any acute splenic infarct.  Continue to hold off any anticoagulation.   # Abdominal pain-unclear etiology.  Patient has complicated GI history.  Recommend evaluation with gastroenterologist.  Recommend patient return to PCP for GI referral.   # ? Burger's disease involving upper extremities-currently asymptomatic [quit smoking]; on Pletal plus asprin.   Patient was requested to call PCPs office for further referral/recommendation.  Dr.Khan aware of our recommendations.  Patient is in agreement.  # DISPOSITION: # follow up as needed- Dr.B

## 2021-02-17 ENCOUNTER — Ambulatory Visit: Payer: 59 | Admitting: Hospice and Palliative Medicine

## 2021-02-24 ENCOUNTER — Other Ambulatory Visit: Payer: Self-pay

## 2021-02-24 ENCOUNTER — Ambulatory Visit: Payer: Managed Care, Other (non HMO) | Admitting: Internal Medicine

## 2021-02-24 ENCOUNTER — Encounter: Payer: Self-pay | Admitting: Internal Medicine

## 2021-02-24 VITALS — BP 136/80 | HR 83 | Temp 98.2°F | Ht 67.5 in | Wt 340.0 lb

## 2021-02-24 DIAGNOSIS — I731 Thromboangiitis obliterans [Buerger's disease]: Secondary | ICD-10-CM | POA: Diagnosis not present

## 2021-02-24 DIAGNOSIS — G8929 Other chronic pain: Secondary | ICD-10-CM

## 2021-02-24 DIAGNOSIS — M25562 Pain in left knee: Secondary | ICD-10-CM

## 2021-02-24 DIAGNOSIS — R7309 Other abnormal glucose: Secondary | ICD-10-CM

## 2021-02-24 DIAGNOSIS — M25561 Pain in right knee: Secondary | ICD-10-CM | POA: Diagnosis not present

## 2021-02-24 DIAGNOSIS — D735 Infarction of spleen: Secondary | ICD-10-CM

## 2021-02-24 DIAGNOSIS — R1084 Generalized abdominal pain: Secondary | ICD-10-CM

## 2021-02-24 DIAGNOSIS — Z6841 Body Mass Index (BMI) 40.0 and over, adult: Secondary | ICD-10-CM

## 2021-02-24 NOTE — Progress Notes (Signed)
[facility] [place], [place] [address]  Internal MEDICINE  Office Visit Note  Patient Name: [name]  [id number]  [id number]  Date of Service: 02/24/2021  Chief Complaint  Patient presents with  . abnormal heart rate  . DME for lift chair  . discuss other health issues    HPI Pt is here for acute visit 1. Concerns of elevated heart rate after getting COVID vaccine 2. 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 Has hard time moving around and getting out of bed, cannot stand straight due to stiffness in her knees and back, needs a chair lift  4. Chronic abdominal pain, has SIBO by history 5. Multiple other complaints including discoloration of nail beds which have improved after taking abx    Current Medication: Outpatient Encounter Medications as of 02/24/2021  Medication Sig  . amLODipine (NORVASC) 10 MG tablet TAKE ONE TABLET BY MOUTH EVERY NIGHT AT BEDTIME  . cilostazol (PLETAL) 50 MG tablet Take 50 mg by mouth 2 (two) times daily.  . Cranberry 1000 MG CAPS Take 1 tablet by mouth daily.  . CVS ASPIRIN LOW DOSE 81 MG EC tablet Take 1 tablet (81 mg total) by mouth daily.  . cyanocobalamin 1000 MCG tablet Take 1,000 mcg by mouth every other day.  . famotidine (PEPCID) 20 MG tablet TAKE ONE TABLET BY MOUTH TWICE A DAY  . folic acid (FOLVITE) 1 MG tablet Take 1 tablet (1 mg total) by mouth daily.  Marland Kitchen levonorgestrel (MIRENA) 20 MCG/24HR IUD by Intrauterine route.  Marland Kitchen levothyroxine (SYNTHROID) 200 MCG tablet TAKE 1 TABLET DAILY. PT IS TAKING 200 MCG FIVE DAYS A WEEK AND ON WEDNESDAYS AND SATURDAYS 1/2 PILL  . lipase/protease/amylase (CREON) 12000-38000 units CPEP capsule Take 2 capsules (24,000 Units total) by mouth 3 (three) times daily before meals.  . pantoprazole (PROTONIX) 40 MG tablet Take 1 tablet (40 mg total) by mouth 2 (two) times daily.  . Vitamin D, Ergocalciferol, (DRISDOL) 1.25 MG (50000 UNIT) CAPS  capsule TAKE 1 CAPSULE ONCE A WEEK  . nitrofurantoin, macrocrystal-monohydrate, (MACROBID) 100 MG capsule Take 1 capsule (100 mg total) by mouth 2 (two) times daily.  . [DISCONTINUED] atorvastatin (LIPITOR) 40 MG tablet  (Patient not taking: Reported on 12/24/2020)   No facility-administered encounter medications on file as of 02/24/2021.    Surgical History: Past Surgical History:  Procedure Laterality Date  . ADENOIDECTOMY     Small child  . APPENDECTOMY     Age 46  . COLON SURGERY  1980   ileostomy and internal pouch age 32  . DILATATION & CURETTAGE/HYSTEROSCOPY WITH MYOSURE N/A 05/02/2015   Procedure: DILATATION & CURETTAGE/HYSTEROSCOPY WITH MYOSURE;  Surgeon: Malachy Mood, MD;  Location: [facility] ORS;  Service: Gynecology;  Laterality: N/A;  . LAPAROSCOPIC GASTRIC SLEEVE RESECTION  07/06/2016    Medical History: Past Medical History:  Diagnosis Date  . Berger's disease 2011  . Buerger's disease ([place])   . Family history of adverse reaction to anesthesia    sister and daughter difficult to wake up  . Folic acid deficiency   . GERD (gastroesophageal reflux disease)   . Hypertension   . Hypothyroidism   . IDA (iron deficiency anemia)   . Thrombocytosis     Family History: Family History  Problem Relation Age of Onset  . Hypertension Mother   . Congestive Heart Failure Father   . Hypertension Father   . Congestive Heart Failure Maternal  Grandmother   . Colon cancer Maternal Grandfather 6  . Skin cancer Maternal Aunt        Basal cell  . Skin cancer Maternal Uncle        Basal cell  . Skin cancer Cousin        Basal Cell    Social History   Socioeconomic History  . Marital status: Married    Spouse name: Not on file  . Number of children: Not on file  . Years of education: Not on file  . Highest education level: Not on file  Occupational History  . Not on file  Tobacco Use  . Smoking status: Former Smoker    Types: Cigarettes    Quit date: 04/28/2010     Years since quitting: 10.8  . Smokeless tobacco: Never Used  Substance and Sexual Activity  . Alcohol use: No    Alcohol/week: 0.0 standard drinks  . Drug use: No  . Sexual activity: Yes    Birth control/protection: None  Other Topics Concern  . Not on file  Social History Narrative  . Not on file   Social Determinants of Health   Financial Resource Strain: Not on file  Food Insecurity: Not on file  Transportation Needs: Not on file  Physical Activity: Not on file  Stress: Not on file  Social Connections: Not on file  Intimate Partner Violence: Not on file      Review of Systems  Constitutional: Negative for chills, diaphoresis and fatigue.  HENT: Negative for ear pain, postnasal drip and sinus pressure.   Eyes: Negative for photophobia, discharge, redness, itching and visual disturbance.  Respiratory: Negative for cough, shortness of breath and wheezing.   Cardiovascular: Positive for palpitations. Negative for chest pain and leg swelling.  Gastrointestinal: Positive for abdominal pain. Negative for constipation, diarrhea, nausea and vomiting.  Genitourinary: Negative for dysuria and flank pain.  Musculoskeletal: Positive for arthralgias and gait problem. Negative for back pain and neck pain.  Skin: Negative for color change.  Allergic/Immunologic: Negative for environmental allergies and food allergies.  Neurological: Negative for dizziness and headaches.  Hematological: Does not bruise/bleed easily.  Psychiatric/Behavioral: Negative for agitation, behavioral problems (depression) and hallucinations.    Vital Signs: BP 136/80   Pulse 83   Temp 98.2 F (36.8 C)   Ht 5' 7.5" (1.715 m)   Wt (!) 340 lb (154.2 kg)   SpO2 98%   BMI 52.47 kg/m    Physical Exam Constitutional:      Appearance: She is obese.  HENT:     Head: Normocephalic and atraumatic.  Eyes:     Pupils: Pupils are equal, round, and reactive to light.  Cardiovascular:     Rate and Rhythm:  Normal rate and regular rhythm.  Pulmonary:     Effort: Pulmonary effort is normal.     Breath sounds: Normal breath sounds.  Musculoskeletal:        General: Tenderness present. No swelling.  Skin:    General: Skin is warm.  Neurological:     Gait: Gait abnormal.  Psychiatric:        Mood and Affect: Mood normal.        Thought Content: Thought content normal.        Assessment/Plan: 1. Generalized abdominal pain Pt has ongoing complaints of ab pain, h/o SIBO, colostomy status, refer to GI for further work up  - Ambulatory referral to Gastroenterology  2. Chronic pain of both knees Pt has chronic deconditioning,  sedentary life style, she will get benefit from wt loss, might need gait and posture training, IA steroids injection  - Ambulatory referral to Orthopedic Surgery - Ambulatory referral to Physical Therapy - Chair lift for transfer from bed to chair   3. Morbid obesity with BMI of 50.0-59.9, adult (HCC) Obesity Counseling: Risk Assessment: An assessment of behavioral risk factors was made today and includes lack of exercise sedentary lifestyle, lack of portion control and poor dietary habits.  Risk Modification Advice: She was counseled on portion control guidelines. Restricting daily caloric intake to 1500 The detrimental long term effects of obesity on her health and ongoing poor compliance was also discussed with the patient.  - Ambulatory referral to Orthopedic Surgery - Ambulatory referral to Physical Therapy  4. Buerger's disease ([place]) Previous history now on norvasc, ASA and pletal   5. Splenic infarct Pt is instructed to see her Obgyn for possible IUD removal   6. Abnormal glucose Pt might be a good candidate for Rybelsus   General Counseling: Sandy Salaam understanding of the findings of todays visit and agrees with plan of treatment. I have discussed any further diagnostic evaluation that may be needed or ordered today. We also reviewed her  medications today. she has been encouraged to call the office with any questions or concerns that should arise related to todays visit.    Orders Placed This Encounter  Procedures  . Ambulatory referral to Gastroenterology  . Ambulatory referral to Orthopedic Surgery  . Ambulatory referral to Physical Therapy     Total time spent: 30 Minutes Time spent includes review of chart, medications, test results, and follow up plan with the patient.   [place] Controlled Substance Database was reviewed by me.   Dr Lavera Guise Internal medicine

## 2021-02-25 ENCOUNTER — Other Ambulatory Visit: Payer: Self-pay

## 2021-02-25 ENCOUNTER — Other Ambulatory Visit: Payer: Self-pay | Admitting: Obstetrics and Gynecology

## 2021-02-25 DIAGNOSIS — N911 Secondary amenorrhea: Secondary | ICD-10-CM

## 2021-02-25 DIAGNOSIS — N951 Menopausal and female climacteric states: Secondary | ICD-10-CM

## 2021-02-25 MED ORDER — CVS ASPIRIN LOW DOSE 81 MG PO TBEC: 162.0000 mg | DELAYED_RELEASE_TABLET | Freq: Every day | ORAL | 1 refills | Status: DC

## 2021-02-25 NOTE — Addendum Note (Signed)
Addended by: Edd Arbour on: 02/25/2021 12:16 PM   Modules accepted: Orders

## 2021-02-25 NOTE — Addendum Note (Signed)
Addended by: Edd Arbour on: 02/25/2021 10:48 AM   Modules accepted: Orders

## 2021-02-26 ENCOUNTER — Other Ambulatory Visit: Payer: Self-pay

## 2021-02-26 ENCOUNTER — Other Ambulatory Visit: Payer: 59

## 2021-02-26 DIAGNOSIS — N911 Secondary amenorrhea: Secondary | ICD-10-CM

## 2021-02-26 DIAGNOSIS — N951 Menopausal and female climacteric states: Secondary | ICD-10-CM

## 2021-02-27 LAB — ESTRADIOL: Estradiol: 27.6 pg/mL

## 2021-02-27 LAB — FOLLICLE STIMULATING HORMONE: FSH: 45.7 m[IU]/mL

## 2021-03-03 ENCOUNTER — Encounter: Payer: Self-pay | Admitting: Internal Medicine

## 2021-03-09 ENCOUNTER — Ambulatory Visit: Payer: 59 | Admitting: Internal Medicine

## 2021-03-11 ENCOUNTER — Ambulatory Visit: Payer: 59 | Admitting: Orthopedic Surgery

## 2021-04-07 ENCOUNTER — Ambulatory Visit: Payer: 59 | Admitting: Internal Medicine

## 2021-04-20 ENCOUNTER — Ambulatory Visit: Payer: 59 | Admitting: Hospice and Palliative Medicine

## 2021-04-20 ENCOUNTER — Telehealth: Payer: Self-pay | Admitting: Internal Medicine

## 2021-04-20 NOTE — Telephone Encounter (Signed)
5.6.22>>s/w referring office, they stated referral was denied. She was unable to let me know why. Will need to refer patient elsewhere.5.9.22>>s/w patient to let her know [facility] denied the referral. She stated she has an upper endoscopy scheduled for 04/30/21 @ [facility]

## 2021-04-22 ENCOUNTER — Ambulatory Visit: Payer: Managed Care, Other (non HMO) | Admitting: Internal Medicine

## 2021-04-22 ENCOUNTER — Other Ambulatory Visit: Payer: Self-pay

## 2021-04-22 ENCOUNTER — Encounter: Payer: Self-pay | Admitting: Nurse Practitioner

## 2021-04-22 ENCOUNTER — Encounter: Payer: Self-pay | Admitting: Internal Medicine

## 2021-04-22 ENCOUNTER — Other Ambulatory Visit
Admission: RE | Admit: 2021-04-22 | Discharge: 2021-04-22 | Disposition: A | Discharge status: Home / Self Care | Payer: 59 | Attending: Internal Medicine | Admitting: Internal Medicine

## 2021-04-22 VITALS — BP 144/88 | HR 84 | Temp 98.3°F | Resp 16 | Ht 67.0 in | Wt 340.0 lb

## 2021-04-22 DIAGNOSIS — Z6841 Body Mass Index (BMI) 40.0 and over, adult: Secondary | ICD-10-CM

## 2021-04-22 DIAGNOSIS — E039 Hypothyroidism, unspecified: Secondary | ICD-10-CM

## 2021-04-22 DIAGNOSIS — R634 Abnormal weight loss: Secondary | ICD-10-CM

## 2021-04-22 DIAGNOSIS — R262 Difficulty in walking, not elsewhere classified: Secondary | ICD-10-CM | POA: Diagnosis not present

## 2021-04-22 DIAGNOSIS — I10 Essential (primary) hypertension: Secondary | ICD-10-CM

## 2021-04-22 LAB — T4, FREE: Free T4: 2.02 ng/dL — ABNORMAL HIGH (ref 0.61–1.12)

## 2021-04-22 LAB — TSH: TSH: 1.058 u[IU]/mL (ref 0.350–4.500)

## 2021-04-22 NOTE — Progress Notes (Signed)
[facility] [place], [place] [address]  Internal MEDICINE  Office Visit Note  Patient Name: [name]  [id number]  [id number]  Date of Service: 04/29/2021  Chief Complaint  Patient presents with  . Follow-up  . Gastroesophageal Reflux  . Anemia    HPI  Patient is here for routine follow-up she has multiple medical problems, she sees multiple specialist, she is really concerned about her weight and is here for further recommendations in assisting with weight loss and management her past medical history is significant for recent splenic infarct history of Burger's disease she is also on Norvasc and Pletal Patient has history of hypothyroidism her free T4 has been elevated for quite some time now she will need repeat TSH and free T4. Patient has severe osteoarthritis of her knees and unable to stand up will like to have physical therapy Patient also has IUD Mirena and was advised by OB/GYN to continue to use it, however I have advised her against it due to her history of splenic infarct She also has history of gastric bypass, unable to be her here in the office so she patient was sent to [facility] for wt  Current Medication: Outpatient Encounter Medications as of 04/22/2021  Medication Sig  . amLODipine (NORVASC) 10 MG tablet TAKE ONE TABLET BY MOUTH EVERY NIGHT AT BEDTIME  . cilostazol (PLETAL) 50 MG tablet Take 50 mg by mouth 2 (two) times daily.  . Cranberry 1000 MG CAPS Take 1 tablet by mouth daily.  . CVS ASPIRIN LOW DOSE 81 MG EC tablet Take 2 tablets (162 mg total) by mouth daily.  . famotidine (PEPCID) 20 MG tablet TAKE ONE TABLET BY MOUTH TWICE A DAY  . folic acid (FOLVITE) 1 MG tablet Take 1 tablet (1 mg total) by mouth daily.  Marland Kitchen levonorgestrel (MIRENA) 20 MCG/24HR IUD by Intrauterine route.  Marland Kitchen levothyroxine (SYNTHROID) 200 MCG tablet TAKE 1 TABLET DAILY. PT IS TAKING 200 MCG FIVE DAYS A WEEK AND ON WEDNESDAYS AND SATURDAYS 1/2 PILL  . pantoprazole  (PROTONIX) 40 MG tablet Take 1 tablet (40 mg total) by mouth 2 (two) times daily.  . Vitamin D, Ergocalciferol, (DRISDOL) 1.25 MG (50000 UNIT) CAPS capsule TAKE 1 CAPSULE ONCE A WEEK   No facility-administered encounter medications on file as of 04/22/2021.    Surgical History: Past Surgical History:  Procedure Laterality Date  . ADENOIDECTOMY     Small child  . APPENDECTOMY     Age 34  . COLON SURGERY  1980   ileostomy and internal pouch age 62  . DILATATION & CURETTAGE/HYSTEROSCOPY WITH MYOSURE N/A 05/02/2015   Procedure: DILATATION & CURETTAGE/HYSTEROSCOPY WITH MYOSURE;  Surgeon: Malachy Mood, MD;  Location: [facility] ORS;  Service: Gynecology;  Laterality: N/A;  . LAPAROSCOPIC GASTRIC SLEEVE RESECTION  07/06/2016    Medical History: Past Medical History:  Diagnosis Date  . Berger's disease 2011  . Buerger's disease ([place])   . Family history of adverse reaction to anesthesia    sister and daughter difficult to wake up  . Folic acid deficiency   . GERD (gastroesophageal reflux disease)   . Hypertension   . Hypothyroidism   . IDA (iron deficiency anemia)   . Thrombocytosis     Family History: Family History  Problem Relation Age of Onset  . Hypertension Mother   . Congestive Heart Failure Father   . Hypertension Father   . Congestive Heart Failure Maternal Grandmother   . Colon cancer Maternal Grandfather 11  .  Skin cancer Maternal Aunt        Basal cell  . Skin cancer Maternal Uncle        Basal cell  . Skin cancer Cousin        Basal Cell  . Stroke Sister     Social History   Socioeconomic History  . Marital status: Married    Spouse name: Not on file  . Number of children: Not on file  . Years of education: Not on file  . Highest education level: Not on file  Occupational History  . Not on file  Tobacco Use  . Smoking status: Former Smoker    Types: Cigarettes    Quit date: 04/28/2010    Years since quitting: 11.0  . Smokeless tobacco: Never Used   Substance and Sexual Activity  . Alcohol use: No    Alcohol/week: 0.0 standard drinks  . Drug use: No  . Sexual activity: Yes    Birth control/protection: None  Other Topics Concern  . Not on file  Social History Narrative  . Not on file   Social Determinants of Health   Financial Resource Strain: Not on file  Food Insecurity: Not on file  Transportation Needs: Not on file  Physical Activity: Not on file  Stress: Not on file  Social Connections: Not on file  Intimate Partner Violence: Not on file      Review of Systems  Constitutional: Negative for chills, diaphoresis and fatigue.  HENT: Negative for ear pain, postnasal drip and sinus pressure.   Eyes: Negative for photophobia, discharge, redness, itching and visual disturbance.  Respiratory: Negative for cough, shortness of breath and wheezing.   Cardiovascular: Negative for chest pain, palpitations and leg swelling.  Gastrointestinal: Positive for abdominal distention. Negative for abdominal pain, constipation, diarrhea, nausea and vomiting.  Genitourinary: Negative for dysuria and flank pain.  Musculoskeletal: Positive for arthralgias. Negative for back pain, gait problem and neck pain.  Skin: Negative for color change.  Allergic/Immunologic: Negative for environmental allergies and food allergies.  Neurological: Negative for dizziness and headaches.  Hematological: Does not bruise/bleed easily.  Psychiatric/Behavioral: Negative for agitation, behavioral problems (depression) and hallucinations.    Vital Signs: BP (!) 144/88   Pulse 84   Temp 98.3 F (36.8 C)   Resp 16   Ht 5\' 7"  (1.702 m)   Wt (!) 340 lb (154.2 kg)   SpO2 98%   BMI 53.25 kg/m    Physical Exam No exam is performed today   Assessment/Plan: 1. Difficulty walking involving multiple joints Patient has difficulty walking due to her weight and severe osteoarthritis of her knees, she also has been home since [place] pandemic and has sedentary  lifestyle it is recommended that patient should get some sort of physical therapy so she can regain her independence to at least do ADLs at home -Ambulatory referral to Physical Therapy  2. BMI 50.0-59.9, adult [facility]) Patient has history of gastric bypass however it has failed she has multiple problems including SIBO and has been in the process of seeing her GI for evaluation of celiac disease she is scheduled to have EGD patient is encouraged to restrict her calories to 1500 after doing her metabolic test Obesity Counseling: Risk Assessment: An assessment of behavioral risk factors was made today and includes lack of exercise sedentary lifestyle, lack of portion control and poor dietary habits.  Risk Modification Advice: She was counseled on portion control guidelines. Restricting daily caloric intake to 1500. The detrimental long term  effects of obesity on her health and ongoing poor compliance was also discussed with the patient.  - Metabolic Test - Ambulatory referral to Physical Therapy  3. Hypothyroidism, unspecified type Her recent free T4 has been elevated since then she has been taking Synthroid 200 mcg 5 days of the week and 100 mg for 2 days however her free T4 is really elevated and will like to decrease that to Synthroid 150 mcg once a day a day we will repeat labs - TSH + free T4  4. Essential hypertension, benign Her initial blood pressure is slightly elevated she is concerned about that blood pressure was repeated by me and it was 140/78, continue all her medications as before  General Counseling: Sandy Salaam understanding of the findings of todays visit and agrees with plan of treatment. I have discussed any further diagnostic evaluation that may be needed or ordered today. We also reviewed her medications today. she has been encouraged to call the office with any questions or concerns that should arise related to todays visit.    Orders Placed This Encounter  Procedures   . Metabolic Test  . TSH + free T4      Total time spent: 30Minutes Time spent includes review of chart, medications, test results, and follow up plan with the patient.   [place] Controlled Substance Database was reviewed by me.   Dr Lavera Guise Internal medicine

## 2021-04-28 ENCOUNTER — Telehealth: Payer: Self-pay

## 2021-04-29 ENCOUNTER — Telehealth: Payer: Self-pay

## 2021-04-29 ENCOUNTER — Other Ambulatory Visit: Payer: Self-pay | Admitting: Internal Medicine

## 2021-04-29 ENCOUNTER — Encounter: Payer: Self-pay | Admitting: Internal Medicine

## 2021-04-29 DIAGNOSIS — S31109S Unspecified open wound of abdominal wall, unspecified quadrant without penetration into peritoneal cavity, sequela: Secondary | ICD-10-CM

## 2021-04-29 MED ORDER — LEVOTHYROXINE SODIUM 150 MCG PO TABS: 150.0000 ug | ORAL_TABLET | Freq: Every day | ORAL | 1 refills | Status: DC

## 2021-04-29 NOTE — Telephone Encounter (Signed)
Per Alyssa, advised pt that higher dose of thyroid meds could elevate Bp and for pt to check and keep log of Bp's daily, if not improving that pt needs to call us and may have to increase Bp meds or change to different.

## 2021-04-29 NOTE — Telephone Encounter (Signed)
Spoke to pt and informed her that Per DFK we needed to lower her Thyroid medication bc of lab results. I have sent levothyroxine 150 mcg to her pharmacy at [facility].

## 2021-04-29 NOTE — Telephone Encounter (Signed)
Wound referral sent

## 2021-04-29 NOTE — Progress Notes (Signed)
Please advise pt she needs to go down on her synthroid, I can send a prescription for 150 mcg of synthroid

## 2021-04-30 NOTE — Telephone Encounter (Signed)
Please check this

## 2021-05-01 ENCOUNTER — Telehealth: Payer: Self-pay

## 2021-05-01 NOTE — Telephone Encounter (Signed)
Pt called and wanted to inform DFK that she had the upper endoscopy done and the dr told her everything looked good and they found one place with a bunch of bumps they took a biopsy.  The places where she felt like her organs were moving the dr thinks it may be scar tissue.

## 2021-05-01 NOTE — Telephone Encounter (Signed)
error 

## 2021-05-04 ENCOUNTER — Encounter: Payer: 59 | Attending: Internal Medicine | Admitting: Internal Medicine

## 2021-05-04 ENCOUNTER — Other Ambulatory Visit: Payer: Self-pay

## 2021-05-04 DIAGNOSIS — Z8616 Personal history of COVID-19: Secondary | ICD-10-CM | POA: Diagnosis not present

## 2021-05-04 DIAGNOSIS — Z993 Dependence on wheelchair: Secondary | ICD-10-CM | POA: Insufficient documentation

## 2021-05-04 DIAGNOSIS — L98498 Non-pressure chronic ulcer of skin of other sites with other specified severity: Secondary | ICD-10-CM | POA: Insufficient documentation

## 2021-05-04 DIAGNOSIS — L02214 Cutaneous abscess of groin: Secondary | ICD-10-CM | POA: Insufficient documentation

## 2021-05-04 DIAGNOSIS — Z9049 Acquired absence of other specified parts of digestive tract: Secondary | ICD-10-CM | POA: Diagnosis not present

## 2021-05-04 DIAGNOSIS — Z932 Ileostomy status: Secondary | ICD-10-CM | POA: Diagnosis not present

## 2021-05-04 DIAGNOSIS — Z87891 Personal history of nicotine dependence: Secondary | ICD-10-CM | POA: Insufficient documentation

## 2021-05-04 NOTE — Progress Notes (Signed)
KISSY, CIELO ([id number]) Visit Report for 05/04/2021 Abuse/Suicide Risk Screen Details Patient Name: [name], RON. Date of Service: 05/04/2021 9:45 AM Medical Record Number: [id number] Patient Account Number: [id number] Date of Birth/Sex: 10/06/1966 (55 y.o. F) Treating RN: Primary Care Krisa Blattner: Clayborn Bigness Other Clinician: Referring Samiel Peel: Clayborn Bigness Treating Akita Maxim/Extender: Tito Dine in Treatment: 0 Abuse/Suicide Risk Screen Items Answer ABUSE RISK SCREEN: Has anyone close to you tried to hurt or harm you recentlyo No Do you feel uncomfortable with anyone in your familyo No Has anyone forced you do things that you didnot want to doo No Electronic Signature(s) Signed: 05/04/2021 2:18:56 PM By: Carlene Coria RN Entered By: Carlene Coria on 05/04/2021 10:41:15 [name] ([id number]) -------------------------------------------------------------------------------- Activities of Daily Living Details Patient Name: [name]. Date of Service: 05/04/2021 9:45 AM Medical Record Number: [id number] Patient Account Number: [id number] Date of Birth/Sex: December 25, 1965 (55 y.o. F) Treating RN: Primary Care Iszabella Hebenstreit: Clayborn Bigness Other Clinician: Referring Tiffanni Scarfo: Clayborn Bigness Treating Caydee Talkington/Extender: Tito Dine in Treatment: 0 Activities of Daily Living Items Answer Activities of Daily Living (Please select one for each item) Drive Automobile Completely Able Take Medications Completely Able Use Telephone Completely Able Care for Appearance Need Assistance Use Toilet Completely Able Bath / Shower Need Assistance Dress Self Need Assistance Feed Self Completely Able Walk Completely Able Get In / Out Bed Need Assistance Housework Need Assistance Prepare Meals Need Assistance Handle Money Need Assistance Shop for Self Need Assistance Electronic Signature(s) Signed: 05/04/2021 2:18:56 PM By: Carlene Coria RN Entered By: Carlene Coria on 05/04/2021  10:41:20 [name] ([id number]) -------------------------------------------------------------------------------- Education Screening Details Patient Name: [name]. Date of Service: 05/04/2021 9:45 AM Medical Record Number: [id number] Patient Account Number: [id number] Date of Birth/Sex: Nov 20, 1966 (55 y.o. F) Treating RN: Primary Care Addilyne Backs: Clayborn Bigness Other Clinician: Referring Shylo Zamor: Clayborn Bigness Treating Rylin Saez/Extender: Tito Dine in Treatment: 0 Primary Learner Assessed: Patient Learning Preferences/Education Level/Primary Language Learning Preference: Explanation Highest Education Level: College or Above Preferred Language: English Cognitive Barrier Language Barrier: No Translator Needed: No Memory Deficit: No Emotional Barrier: No Cultural/Religious Beliefs Affecting Medical Care: No Physical Barrier Impaired Vision: Yes Glasses Impaired Hearing: No Decreased Hand dexterity: No Knowledge/Comprehension Knowledge Level: Medium Comprehension Level: High Ability to understand written instructions: High Ability to understand verbal instructions: High Motivation Anxiety Level: Anxious Cooperation: Cooperative Education Importance: Acknowledges Need Interest in Health Problems: Asks Questions Perception: Coherent Willingness to Engage in Self-Management High Activities: Readiness to Engage in Self-Management High Activities: Electronic Signature(s) Signed: 05/04/2021 2:18:56 PM By: Carlene Coria RN Entered By: Carlene Coria on 05/04/2021 10:41:25 [name] ([id number]) -------------------------------------------------------------------------------- Fall Risk Assessment Details Patient Name: [name]. Date of Service: 05/04/2021 9:45 AM Medical Record Number: [id number] Patient Account Number: [id number] Date of Birth/Sex: 09-Jun-1966 (55 y.o. F) Treating RN: Primary Care Avien Taha: Clayborn Bigness Other Clinician: Referring Chantil Bari:  Clayborn Bigness Treating Concha Sudol/Extender: Tito Dine in Treatment: 0 Fall Risk Assessment Items Have you had 2 or more falls in the last 12 monthso 0 No Have you had any fall that resulted in injury in the last 12 monthso 0 No FALLS RISK SCREEN History of falling - immediate or within 3 months 0 No Secondary diagnosis (Do you have 2 or more medical diagnoseso) 0 No Ambulatory aid None/bed rest/wheelchair/nurse 0 No Crutches/cane/walker 0 No Furniture 0 No Intravenous therapy Access/Saline/Heparin Lock 0 No Gait/Transferring Normal/ bed rest/ wheelchair 0 No Weak (short steps with or without shuffle,  stooped but able to lift head while walking, may 0 No seek support from furniture) Impaired (short steps with shuffle, may have difficulty arising from chair, head down, impaired 0 No balance) Mental Status Oriented to own ability 0 No Electronic Signature(s) Signed: 05/04/2021 2:18:56 PM By: Carlene Coria RN Entered By: Carlene Coria on 05/04/2021 10:41:31 [name] ([id number]) -------------------------------------------------------------------------------- Foot Assessment Details Patient Name: [name]. Date of Service: 05/04/2021 9:45 AM Medical Record Number: [id number] Patient Account Number: [id number] Date of Birth/Sex: July 29, 1966 (55 y.o. F) Treating RN: Primary Care Tranae Laramie: Clayborn Bigness Other Clinician: Referring Tannis Burstein: Clayborn Bigness Treating Mcarthur Ivins/Extender: Tito Dine in Treatment: 0 Foot Assessment Items Site Locations + = Sensation present, - = Sensation absent, C = Callus, U = Ulcer R = Redness, W = Warmth, M = Maceration, PU = Pre-ulcerative lesion F = Fissure, S = Swelling, D = Dryness Assessment Right: Left: Other Deformity: No No Prior Foot Ulcer: No No Prior Amputation: No No Charcot Joint: No No Ambulatory Status: Ambulatory Without Help Gait: Steady Electronic Signature(s) Signed: 05/04/2021 2:18:56 PM By: Carlene Coria RN Entered By: Carlene Coria on 05/04/2021 10:41:41 Bettenhausen, [name] ([id number]) -------------------------------------------------------------------------------- Nutrition Risk Screening Details Patient Name: [name]. Date of Service: 05/04/2021 9:45 AM Medical Record Number: [id number] Patient Account Number: [id number] Date of Birth/Sex: 1966-05-22 (55 y.o. F) Treating RN: Primary Care Endora Teresi: Clayborn Bigness Other Clinician: Referring Janiqua Friscia: Clayborn Bigness Treating Starling Christofferson/Extender: Tito Dine in Treatment: 0 Height (in): 67 Weight (lbs): 357 Body Mass Index (BMI): 55.9 Nutrition Risk Screening Items Score Screening NUTRITION RISK SCREEN: I have an illness or condition that made me change the kind and/or amount of food I eat 0 No I eat fewer than two meals per day 0 No I eat few fruits and vegetables, or milk products 0 No I have three or more drinks of beer, liquor or wine almost every day 0 No I have tooth or mouth problems that make it hard for me to eat 0 No I don't always have enough money to buy the food I need 0 No I eat alone most of the time 0 No I take three or more different prescribed or over-the-counter drugs a day 1 Yes Without wanting to, I have lost or gained 10 pounds in the last six months 0 No I am not always physically able to shop, cook and/or feed myself 0 No Nutrition Protocols Good Risk Protocol Moderate Risk Protocol High Risk Proctocol Risk Level: Good Risk Score: 1 Electronic Signature(s) Signed: 05/04/2021 2:18:56 PM By: Carlene Coria RN Entered By: Carlene Coria on 05/04/2021 10:41:37

## 2021-05-07 NOTE — Progress Notes (Signed)
[name], [name] ([id number]) Visit Report for 05/04/2021 Allergy List Details Patient Name: Joanne, [name]. Date of Service: 05/04/2021 9:45 AM Medical Record Number: [id number] Patient Account Number: [id number] Date of Birth/Sex: 05-22-1966 (55 y.o. F) Treating RN: Primary Care Raunak Antuna: Clayborn Bigness Other Clinician: Referring Milt Coye: Clayborn Bigness Treating Drayson Dorko/Extender: Ricard Dillon Weeks in Treatment: 0 Allergies Active Allergies adhesive tape Reaction: rash Severity: Moderate Allergy Notes Electronic Signature(s) Signed: 05/04/2021 2:18:56 PM By: Carlene Coria RN Entered By: Carlene Coria on 05/04/2021 10:41:04 [name] ([id number]) -------------------------------------------------------------------------------- Arrival Information Details Patient Name: [name]. Date of Service: 05/04/2021 9:45 AM Medical Record Number: [id number] Patient Account Number: [id number] Date of Birth/Sex: 1966/08/12 (55 y.o. F) Treating RN: Primary Care Jaydy Fitzhenry: Clayborn Bigness Other Clinician: Referring Sayeed Weatherall: Clayborn Bigness Treating Arlenne Kimbley/Extender: Tito Dine in Treatment: 0 Visit Information Patient Arrived: Wheel Chair Arrival Time: 09:58 Accompanied By: daughter Transfer Assistance: None Patient Identification Verified: Yes Secondary Verification Process Completed: Yes Electronic Signature(s) Signed: 05/07/2021 4:37:07 PM By: Jeanine Luz Entered By: Jeanine Luz on 05/04/2021 10:01:09 [name] ([id number]) -------------------------------------------------------------------------------- Clinic Level of Care Assessment Details Patient Name: [name]. Date of Service: 05/04/2021 9:45 AM Medical Record Number: [id number] Patient Account Number: [id number] Date of Birth/Sex: [birth year] (55 y.o. F) Treating RN: Cornell Barman Primary Care Anaysha Andre: Clayborn Bigness Other Clinician: Referring Khloie Hamada: Clayborn Bigness Treating Steffie Waggoner/Extender: Tito Dine in Treatment: 0 Clinic Level of Care Assessment Items TOOL 2 Quantity Score []  - Use when only an EandM is performed on the INITIAL visit 0 ASSESSMENTS - Nursing Assessment / Reassessment X - General Physical Exam (combine w/ comprehensive assessment (listed just below) when performed on new 1 20 pt. evals) X- 1 25 Comprehensive Assessment (HX, ROS, Risk Assessments, Wounds Hx, etc.) ASSESSMENTS - Wound and Skin Assessment / Reassessment X - Simple Wound Assessment / Reassessment - one wound 1 5 []  - 0 Complex Wound Assessment / Reassessment - multiple wounds []  - 0 Dermatologic / Skin Assessment (not related to wound area) ASSESSMENTS - Ostomy and/or Continence Assessment and Care []  - Incontinence Assessment and Management 0 []  - 0 Ostomy Care Assessment and Management (repouching, etc.) PROCESS - Coordination of Care X - Simple Patient / Family Education for ongoing care 1 15 []  - 0 Complex (extensive) Patient / Family Education for ongoing care []  - 0 Staff obtains Programmer, systems, Records, Test Results / Process Orders []  - 0 Staff telephones HHA, Nursing Homes / Clarify orders / etc []  - 0 Routine Transfer to another Facility (non-emergent condition) []  - 0 Routine Hospital Admission (non-emergent condition) X- 1 15 New Admissions / Biomedical engineer / Ordering NPWT, Apligraf, etc. []  - 0 Emergency Hospital Admission (emergent condition) X- 1 10 Simple Discharge Coordination []  - 0 Complex (extensive) Discharge Coordination PROCESS - Special Needs []  - Pediatric / Minor Patient Management 0 []  - 0 Isolation Patient Management []  - 0 Hearing / Language / Visual special needs []  - 0 Assessment of Community assistance (transportation, D/C planning, etc.) []  - 0 Additional assistance / Altered mentation []  - 0 Support Surface(s) Assessment (bed, cushion, seat, etc.) INTERVENTIONS - Wound Cleansing / Measurement X - Wound Imaging (photographs -  any number of wounds) 1 5 []  - 0 Wound Tracing (instead of photographs) X- 1 5 Simple Wound Measurement - one wound []  - 0 Complex Wound Measurement - multiple wounds Sferrazza, Esly K. ([id number]) X- 1 5 Simple Wound Cleansing - one wound []  - 0 Complex  Wound Cleansing - multiple wounds INTERVENTIONS - Wound Dressings []  - Small Wound Dressing one or multiple wounds 0 X- 1 15 Medium Wound Dressing one or multiple wounds []  - 0 Large Wound Dressing one or multiple wounds []  - 0 Application of Medications - injection INTERVENTIONS - Miscellaneous []  - External ear exam 0 []  - 0 Specimen Collection (cultures, biopsies, blood, body fluids, etc.) []  - 0 Specimen(s) / Culture(s) sent or taken to Lab for analysis []  - 0 Patient Transfer (multiple staff / Civil Service fast streamer / Similar devices) []  - 0 Simple Staple / Suture removal (25 or less) []  - 0 Complex Staple / Suture removal (26 or more) []  - 0 Hypo / Hyperglycemic Management (close monitor of Blood Glucose) []  - 0 Ankle / Brachial Index (ABI) - do not check if billed separately Has the patient been seen at the hospital within the last three years: Yes Total Score: 120 Level Of Care: New/Established - Level 4 Electronic Signature(s) Signed: 05/05/2021 5:46:47 PM By: Gretta Cool, BSN, RN, CWS, Kim RN, BSN Entered By: Gretta Cool, BSN, RN, CWS, Kim on 05/04/2021 11:02:56 [name] ([id number]) -------------------------------------------------------------------------------- Encounter Discharge Information Details Patient Name: [name]. Date of Service: 05/04/2021 9:45 AM Medical Record Number: [id number] Patient Account Number: [id number] Date of Birth/Sex: [birth year] (55 y.o. F) Treating RN: Cornell Barman Primary Care Kodie Kishi: Clayborn Bigness Other Clinician: Referring Donoven Pett: Clayborn Bigness Treating Jacari Iannello/Extender: Tito Dine in Treatment: 0 Encounter Discharge Information Items Post Procedure Vitals Discharge  Condition: Stable Temperature (F): 98.3 Ambulatory Status: Ambulatory Pulse (bpm): 87 Discharge Destination: Home Respiratory Rate (breaths/min): 20 Transportation: Private Auto Blood Pressure (mmHg): 134/81 Accompanied By: self Schedule Follow-up Appointment: Yes Clinical Summary of Care: Electronic Signature(s) Signed: 05/07/2021 4:37:07 PM By: Jeanine Luz Entered By: Jeanine Luz on 05/04/2021 11:17:20 [name] ([id number]) -------------------------------------------------------------------------------- Lower Extremity Assessment Details Patient Name: [name]. Date of Service: 05/04/2021 9:45 AM Medical Record Number: [id number] Patient Account Number: [id number] Date of Birth/Sex: 08-Dec-1966 (55 y.o. F) Treating RN: Primary Care Eman Rynders: Clayborn Bigness Other Clinician: Referring Jalaila Caradonna: Clayborn Bigness Treating Annalyn Blecher/Extender: Tito Dine in Treatment: 0 Electronic Signature(s) Signed: 05/04/2021 2:18:56 PM By: Carlene Coria RN Entered By: Carlene Coria on 05/04/2021 10:40:57 [name] ([id number]) -------------------------------------------------------------------------------- Multi Wound Chart Details Patient Name: [name]. Date of Service: 05/04/2021 9:45 AM Medical Record Number: [id number] Patient Account Number: [id number] Date of Birth/Sex: Aug 27, 1966 (55 y.o. F) Treating RN: Cornell Barman Primary Care Rosy Estabrook: Clayborn Bigness Other Clinician: Referring Jaylynn Siefert: Clayborn Bigness Treating Yuchen Fedor/Extender: Tito Dine in Treatment: 0 Vital Signs Height(in): 67 Pulse(bpm): 32 Weight(lbs): 357 Blood Pressure(mmHg): 134/81 Body Mass Index(BMI): 56 Temperature(F): 98.3 Respiratory Rate(breaths/min): 20 Photos: [N/A:N/A] Wound Location: Right Groin N/A N/A Wounding Event: Gradually Appeared N/A N/A Primary Etiology: Atypical N/A N/A Comorbid History: Sleep Apnea, Hypertension, N/A N/A Peripheral Venous Disease,  Colitis Date Acquired: 04/29/2021 N/A N/A Weeks of Treatment: 0 N/A N/A Wound Status: Open N/A N/A Measurements L x W x D (cm) 0.3x0.7x0.4 N/A N/A Area (cm) : 0.165 N/A N/A Volume (cm) : 0.066 N/A N/A % Reduction in Area: 0.00% N/A N/A % Reduction in Volume: 0.00% N/A N/A Position 1 (o'clock): 3 Maximum Distance 1 (cm): 0.8 Starting Position 1 (o'clock): 1 Ending Position 1 (o'clock): 1 Maximum Distance 1 (cm): 0.4 Tunneling: Yes N/A N/A Undermining: Yes N/A N/A Classification: Full Thickness Without Exposed N/A N/A Support Structures Exudate Amount: Medium N/A N/A Exudate Type: Serosanguineous N/A N/A Exudate Color: red,  brown N/A N/A Granulation Amount: Large (67-100%) N/A N/A Granulation Quality: Pink, Pale N/A N/A Necrotic Amount: None Present (0%) N/A N/A Exposed Structures: Fat Layer (Subcutaneous Tissue): N/A N/A Yes Fascia: No Tendon: No Muscle: No Joint: No Bone: No Epithelialization: None N/A N/A Debridement: Chemical/Enzymatic/Mechanical N/A N/A Pre-procedure Verification/Time 10:59 N/A N/A Out Taken: Instrument: Other(saline and gauze) N/A N/A Bleeding: Minimum N/A N/A Hemostasis Achieved: Pressure N/A N/A JOURNIE, HOWSON. ([id number]) Debridement Treatment Procedure was tolerated well N/A N/A Response: Post Debridement 0.3x0.7x0.4 N/A N/A Measurements L x W x D (cm) Post Debridement Volume: 0.066 N/A N/A (cm) Procedures Performed: Debridement N/A N/A Treatment Notes Wound #1 (Groin) Wound Laterality: Right Cleanser Soap and Water Discharge Instruction: Gently cleanse wound with antibacterial soap, rinse and pat dry prior to dressing wounds Peri-Wound Care Topical Primary Dressing Silvercel Small 2x2 (in/in) Discharge Instruction: Apply Silvercel Small 2x2 (in/in) as instructed Secondary Dressing ABD Pad 5x9 (in/in) Discharge Instruction: Cover with ABD pad Secured With Compression Wrap Compression Stockings Add-Ons Notes Rolled wash cloth  to keep folds separated. Electronic Signature(s) Signed: 05/04/2021 3:42:13 PM By: Linton Ham MD Entered By: Linton Ham on 05/04/2021 11:15:01 [name] ([id number]) -------------------------------------------------------------------------------- [place] Details Patient Name: TAMMALA, WEIDER. Date of Service: 05/04/2021 9:45 AM Medical Record Number: [id number] Patient Account Number: [id number] Date of Birth/Sex: Jul 06, 1966 (55 y.o. F) Treating RN: Cornell Barman Primary Care Deaven Urwin: Clayborn Bigness Other Clinician: Referring Emberlin Verner: Clayborn Bigness Treating Darlynn Ricco/Extender: Tito Dine in Treatment: 0 Active Inactive Orientation to the Wound Care Program Nursing Diagnoses: Knowledge deficit related to the wound healing center program Goals: Patient/caregiver will verbalize understanding of the [facility] Program Date Initiated: 05/04/2021 Target Resolution Date: 05/14/2021 Goal Status: Active Interventions: Provide education on orientation to the wound center Notes: Soft Tissue Infection Nursing Diagnoses: Impaired tissue integrity Potential for infection: soft tissue Goals: Patient will remain free of wound infection Date Initiated: 05/04/2021 Target Resolution Date: 05/14/2021 Goal Status: Active Interventions: Assess signs and symptoms of infection every visit Notes: Wound/Skin Impairment Nursing Diagnoses: Knowledge deficit related to smoking impact on wound healing Goals: Patient/caregiver will verbalize understanding of skin care regimen Date Initiated: 05/04/2021 Target Resolution Date: 05/14/2021 Goal Status: Active Ulcer/skin breakdown will have a volume reduction of 30% by week 4 Date Initiated: 05/04/2021 Target Resolution Date: 06/04/2021 Goal Status: Active Interventions: Assess ulceration(s) every visit Treatment Activities: Skin care regimen initiated : 05/04/2021 Notes: Electronic Signature(s) Signed:  05/05/2021 5:46:47 PM By: Gretta Cool, BSN, RN, CWS, Kim RN, BSN [place], Laquitta K. ([id number]) Entered By: Gretta Cool, BSN, RN, CWS, Kim on 05/04/2021 10:52:22 [name] ([id number]) -------------------------------------------------------------------------------- Pain Assessment Details Patient Name: [name]. Date of Service: 05/04/2021 9:45 AM Medical Record Number: [id number] Patient Account Number: [id number] Date of Birth/Sex: [birth year] (55 y.o. F) Treating RN: Primary Care Armany Mano: Clayborn Bigness Other Clinician: Referring Cyndy Braver: Clayborn Bigness Treating Ieshia Hatcher/Extender: Tito Dine in Treatment: 0 Active Problems Location of Pain Severity and Description of Pain Patient Has Paino No Site Locations Rate the pain. Current Pain Level: 0 Pain Management and Medication Current Pain Management: Electronic Signature(s) Signed: 05/07/2021 4:37:07 PM By: Jeanine Luz Entered By: Jeanine Luz on 05/04/2021 10:01:32 [name] ([id number]) -------------------------------------------------------------------------------- Patient/Caregiver Education Details Patient Name: [name]. Date of Service: 05/04/2021 9:45 AM Medical Record Number: [id number] Patient Account Number: [id number] Date of Birth/Gender: 30-Jul-1966 (55 y.o. F) Treating RN: Cornell Barman Primary Care Physician: Clayborn Bigness Other Clinician: Referring Physician: Clayborn Bigness Treating Physician/Extender: Dellia Nims  MICHAEL G Weeks in Treatment: 0 Education Assessment Education Provided To: Patient Education Topics Provided Welcome To [facility]: Handouts: [facility] Methods: Demonstration, Explain/Verbal Responses: State content correctly Wound/Skin Impairment: Handouts: Caring for Your Ulcer Methods: Demonstration, Explain/Verbal Responses: State content correctly Electronic Signature(s) Signed: 05/05/2021 5:46:47 PM By: Gretta Cool, BSN, RN, CWS, Kim RN, BSN Entered By:  Gretta Cool, BSN, RN, CWS, Kim on 05/04/2021 11:04:50 [name] ([id number]) -------------------------------------------------------------------------------- Wound Assessment Details Patient Name: [name]. Date of Service: 05/04/2021 9:45 AM Medical Record Number: [id number] Patient Account Number: [id number] Date of Birth/Sex: 1966-07-26 (55 y.o. F) Treating RN: Primary Care Adlai Sinning: Clayborn Bigness Other Clinician: Referring Ellis Mehaffey: Clayborn Bigness Treating Joshawa Dubin/Extender: Tito Dine in Treatment: 0 Wound Status Wound Number: 1 Primary Atypical Etiology: Wound Location: Right Groin Wound Status: Open Wounding Event: Gradually Appeared Comorbid Sleep Apnea, Hypertension, Peripheral Venous Date Acquired: 04/29/2021 History: Disease, Colitis Weeks Of Treatment: 0 Clustered Wound: No Photos Wound Measurements Length: (cm) 0.3 Width: (cm) 0.7 Depth: (cm) 0.4 Area: (cm) 0.165 Volume: (cm) 0.066 % Reduction in Area: 0% % Reduction in Volume: 0% Epithelialization: None Tunneling: Yes Position (o'clock): 3 Maximum Distance: (cm) 0.8 Undermining: Yes Starting Position (o'clock): 1 Ending Position (o'clock): 1 Maximum Distance: (cm) 0.4 Wound Description Classification: Full Thickness Without Exposed Support Structures Exudate Amount: Medium Exudate Type: Serosanguineous Exudate Color: red, brown Foul Odor After Cleansing: No Slough/Fibrino No Wound Bed Granulation Amount: Large (67-100%) Exposed Structure Granulation Quality: Pink, Pale Fascia Exposed: No Necrotic Amount: None Present (0%) Fat Layer (Subcutaneous Tissue) Exposed: Yes Tendon Exposed: No Muscle Exposed: No Joint Exposed: No Bone Exposed: No Treatment Notes Wound #1 (Groin) Wound Laterality: Right Salameh, Desarie K. ([id number]) Cleanser Soap and Water Discharge Instruction: Gently cleanse wound with antibacterial soap, rinse and pat dry prior to dressing wounds Peri-Wound  Care Topical Primary Dressing Silvercel Small 2x2 (in/in) Discharge Instruction: Apply Silvercel Small 2x2 (in/in) as instructed Secondary Dressing ABD Pad 5x9 (in/in) Discharge Instruction: Cover with ABD pad Secured With Compression Wrap Compression Stockings Add-Ons Notes Rolled wash cloth to keep folds separated. Electronic Signature(s) Signed: 05/05/2021 5:46:47 PM By: Gretta Cool, BSN, RN, CWS, Kim RN, BSN Entered By: Gretta Cool, BSN, RN, CWS, Kim on 05/04/2021 10:56:50 [name] ([id number]) -------------------------------------------------------------------------------- Vitals Details Patient Name: [name]. Date of Service: 05/04/2021 9:45 AM Medical Record Number: [id number] Patient Account Number: [id number] Date of Birth/Sex: 20-Jun-1966 (55 y.o. F) Treating RN: Primary Care Jaide Hillenburg: Clayborn Bigness Other Clinician: Referring Joselyn Edling: Clayborn Bigness Treating Ural Acree/Extender: Tito Dine in Treatment: 0 Vital Signs Time Taken: 10:00 Temperature (F): 98.3 Height (in): 67 Pulse (bpm): 87 Source: Stated Respiratory Rate (breaths/min): 20 Weight (lbs): 357 Blood Pressure (mmHg): 134/81 Source: Stated Reference Range: 80 - 120 mg / dl Body Mass Index (BMI): 55.9 Electronic Signature(s) Signed: 05/07/2021 4:37:07 PM By: Jeanine Luz Entered By: Jeanine Luz on 05/04/2021 10:03:38

## 2021-05-07 NOTE — Progress Notes (Signed)
[name] ([id number]) Visit Report for 05/04/2021 Chief Complaint Document Details Patient Name: [name], [name]. Date of Service: 05/04/2021 9:45 AM Medical Record Number: [id number] Patient Account Number: [id number] Date of Birth/Sex: 08-Jun-1966 (55 y.o. F) Treating RN: Primary Care Provider: Clayborn Bigness Other Clinician: Referring Provider: Clayborn Bigness Treating Provider/Extender: Tito Dine in Treatment: 0 Information Obtained from: Patient Chief Complaint Wound exam; patient is here for review of an open area in her right inguinal area Electronic Signature(s) Signed: 05/04/2021 3:42:13 PM By: Linton Ham MD Entered By: Linton Ham on 05/04/2021 11:15:56 [name] ([id number]) -------------------------------------------------------------------------------- Debridement Details Patient Name: [name]. Date of Service: 05/04/2021 9:45 AM Medical Record Number: [id number] Patient Account Number: [id number] Date of Birth/Sex: Mar 05, 1966 (55 y.o. F) Treating RN: Primary Care Provider: Clayborn Bigness Other Clinician: Referring Provider: Clayborn Bigness Treating Provider/Extender: Tito Dine in Treatment: 0 Debridement Performed for Wound #1 Right Groin Assessment: Performed By: Physician Ricard Dillon, MD Debridement Type: Chemical/Enzymatic/Mechanical Agent Used: saline and gauze Level of Consciousness (Pre- Awake and Alert procedure): Pre-procedure Verification/Time Out Yes - 10:59 Taken: Instrument: Other : saline and gauze Bleeding: Minimum Hemostasis Achieved: Pressure Response to Treatment: Procedure was tolerated well Level of Consciousness (Post- Awake and Alert procedure): Post Debridement Measurements of Total Wound Length: (cm) 0.3 Width: (cm) 0.7 Depth: (cm) 0.4 Volume: (cm) 0.066 Character of Wound/Ulcer Post Debridement: Stable Post Procedure Diagnosis Same as Pre-procedure Electronic Signature(s) Signed:  05/04/2021 3:42:13 PM By: Linton Ham MD Entered By: Linton Ham on 05/04/2021 11:15:13 [name] ([id number]) -------------------------------------------------------------------------------- HPI Details Patient Name: [name]. Date of Service: 05/04/2021 9:45 AM Medical Record Number: [id number] Patient Account Number: [id number] Date of Birth/Sex: 02-06-1966 (55 y.o. F) Treating RN: Primary Care Provider: Clayborn Bigness Other Clinician: Referring Provider: Clayborn Bigness Treating Provider/Extender: Tito Dine in Treatment: 0 History of Present Illness HPI Description: ADMISSION 05/04/2021 This is a 55 year old woman who is here for our review of a recent open area and her right inguinal area. She says this began about a week ago when she felt an irritation there. She often gets this under her breasts and she uses Desitin cream she put Desitin cream on this. It did not help. Then she applied Neosporin to the area which opened into a small wound. She does not have a history of skin areas in her inguinal areas. 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 She is meticulous about making sure that this does not contaminate the open area we are dealing with. In any case she was seen in the ER on 04/28/2021 and referred here. 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 As mentioned she had a total colectomy at age 55. It sounds as though she  is largely wheelchair-bound. Electronic Signature(s) Signed: 05/04/2021 3:42:13 PM By: Linton Ham MD Entered By: Linton Ham on 05/04/2021 11:26:21 [name] ([id number]) -------------------------------------------------------------------------------- Physical Exam Details Patient Name: [name]. Date of Service: 05/04/2021 9:45 AM Medical Record  Number: [id number] Patient Account Number: [id number] Date of Birth/Sex: Mar 25, 1966 (55 y.o. F) Treating RN: Primary Care Provider: Clayborn Bigness Other Clinician: Referring Provider: Clayborn Bigness Treating Provider/Extender: Tito Dine in Treatment: 0 Constitutional Sitting or standing Blood Pressure is within target range for patient.. Pulse regular and within target range for patient.Marland Kitchen Respirations regular, non- labored and within target range.. Temperature is normal and within the target range for the patient.Marland Kitchen appears in no distress. Gastrointestinal (GI) Obese nontender. She has an open surgical area just medial to where her wound is in the inguinal fold on the right. This is where she catheterizes her pouch. Notes Wound exam; the patient has a small circular open area in the mid part of her inguinal fold. This has 4 mm of depth and some undermining that is worse at 9:00 [to the left]. There is loss of surface epithelium around the open area however the skin otherwise looks remarkably healthy. There is no evidence of cellulitis either bacterial or fungal. No tenderness. No subcutaneous involvement. No palpable mass and no erythema Electronic Signature(s) Signed: 05/04/2021 3:42:13 PM By: Linton Ham MD Entered By: Linton Ham on 05/04/2021 11:28:43 [name] ([id number]) -------------------------------------------------------------------------------- Physician Orders Details Patient Name: [name]. Date of Service: 05/04/2021 9:45 AM Medical Record Number: [id number] Patient Account Number:  [id number] Date of Birth/Sex: 1966-09-16 (55 y.o. F) Treating RN: [name] Primary Care Provider: Clayborn Bigness Other Clinician: Referring Provider: Clayborn Bigness Treating Provider/Extender: Tito Dine in Treatment: 0 Verbal / Phone Orders: No Diagnosis Coding Follow-up Appointments Wound #1 Right Groin o Return Appointment in 1 week. Additional Orders / Instructions o Other: - Roll face cloth into fold to keep skin apart. Wound Treatment Wound #1 - Groin Wound Laterality: Right Cleanser: Soap and Water 1 x Per Day/30 Days Discharge Instructions: Gently cleanse wound with antibacterial soap, rinse and pat dry prior to dressing wounds Primary Dressing: Silvercel Small 2x2 (in/in) (DME) (Generic) 1 x Per Day/30 Days Discharge Instructions: Apply Silvercel Small 2x2 (in/in) as instructed Secondary Dressing: ABD Pad 5x9 (in/in) (DME) (Generic) 1 x Per Day/30 Days Discharge Instructions: Cover with ABD pad Electronic Signature(s) Signed: 05/04/2021 3:42:13 PM By: Linton Ham MD Signed: 05/05/2021 5:46:47 PM By: Gretta Cool, BSN, RN, CWS, Kim RN, BSN Entered By: Gretta Cool, BSN, RN, CWS, Kim on 05/04/2021 11:02:29 [name] ([id number]) -------------------------------------------------------------------------------- Problem List Details Patient Name: [name], [name]. Date of Service: 05/04/2021 9:45 AM Medical Record Number: [id number] Patient Account Number: [id number] Date of Birth/Sex: Apr 17, 1966 (55 y.o. F) Treating RN: Primary Care Provider: Clayborn Bigness Other Clinician: Referring Provider: Clayborn Bigness Treating Provider/Extender: Tito Dine in Treatment: 0 Active Problems ICD-10 Encounter Code Description Active Date MDM Diagnosis L98.498 Non-pressure chronic ulcer of skin of other sites with other specified 05/04/2021 No Yes severity L02.214 Cutaneous abscess of groin 05/04/2021 No Yes Inactive Problems Resolved Problems Electronic Signature(s) Signed:  05/04/2021 3:42:13 PM By: Linton Ham MD Entered By: Linton Ham on 05/04/2021 11:14:55 [name], [name] ([id number]) -------------------------------------------------------------------------------- Progress Note Details Patient Name: [name]. Date of Service: 05/04/2021 9:45 AM Medical Record Number: [id number] Patient Account Number: [id number] Date of Birth/Sex: 09/08/1966 (55 y.o. F) Treating RN: Primary Care Provider: Clayborn Bigness Other Clinician: Referring Provider: Clayborn Bigness Treating Provider/Extender: Tito Dine in Treatment: 0 Subjective Chief Complaint Information obtained from Patient Wound exam; patient is here for review of an open area in her right inguinal area History of Present Illness (HPI) ADMISSION 05/04/2021 This  is a 55 year old woman who is here for our review of a recent open area and her right inguinal area. She says this began about a week ago when she felt an irritation there. She often gets this under her breasts and she uses Desitin cream she put Desitin cream on this. It did not help. Then she applied Neosporin to the area which opened into a small wound. She does not have a history of skin areas in her inguinal areas. 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 She is meticulous about making sure that this does not contaminate the open area we are dealing with. In any case she was seen in the ER on 04/28/2021 and referred here. 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 As mentioned she had a total colectomy at age 41. It sounds as though she is largely wheelchair-bound. Patient History Information obtained from Patient. Allergies adhesive tape (Severity: Moderate, Reaction: rash) Family History Cancer - Maternal Grandparents,Father,Mother,Siblings, Heart Disease - Father, Hypertension - Mother,Father, Stroke - Siblings, Thyroid Problems - Siblings, No family history of Diabetes, Hereditary Spherocytosis, Kidney Disease, Lung Disease, Seizures, Tuberculosis. Social History Former smoker - started on 12/14/1983 - ended on 03/13/2010, Marital Status - Married, Alcohol Use - Never, Drug Use - No History, Caffeine Use - Moderate. Medical History Eyes Denies history of Cataracts, Glaucoma, Optic Neuritis Ear/Nose/Mouth/Throat Denies history of Chronic sinus problems/congestion, Middle ear problems Hematologic/Lymphatic Denies history of Anemia, Hemophilia, Human Immunodeficiency Virus, Lymphedema, Sickle Cell Disease Respiratory Patient has history of Sleep Apnea - 10+ years Denies history of Aspiration, Asthma, Chronic Obstructive Pulmonary Disease (COPD), Tuberculosis Cardiovascular Patient has history of Hypertension, Peripheral Venous Disease Denies history of Angina, Arrhythmia, Congestive Heart Failure, Coronary Artery Disease, Deep Vein Thrombosis, Hypotension, Myocardial Infarction, Peripheral Arterial Disease, Phlebitis, Vasculitis Gastrointestinal Patient has history of Colitis - colon removed - internal pouch Denies history of Cirrhosis , Crohn s, Hepatitis A, Hepatitis B, Hepatitis C Endocrine Denies history of Type I Diabetes, Type II Diabetes Genitourinary Denies history of End Stage Renal Disease Immunological Denies history of Lupus Erythematosus, Raynaud s, Scleroderma Integumentary (Skin) Denies history of History of Burn, History of pressure  wounds Musculoskeletal Denies history of Gout, Rheumatoid Arthritis, Osteoarthritis, Osteomyelitis [name], Joanne K. 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 Eyes Denies complaints or symptoms of Dry Eyes, Vision Changes, Glasses / Contacts. Ear/Nose/Mouth/Throat Denies complaints or symptoms of Difficult clearing ears, Sinusitis. Hematologic/Lymphatic Denies complaints or symptoms of Bleeding / Clotting Disorders, Human Immunodeficiency Virus. Respiratory Denies complaints or symptoms of Chronic or frequent coughs, Shortness of Breath. Cardiovascular Denies complaints or symptoms of Chest pain, LE edema. Gastrointestinal Denies complaints or symptoms of Frequent diarrhea, Nausea, Vomiting. Endocrine  Denies complaints or symptoms of Hepatitis, Thyroid disease, Polydypsia (Excessive Thirst). Genitourinary Denies complaints or symptoms of Kidney failure/ Dialysis, Incontinence/dribbling. Immunological Denies complaints or symptoms of Hives, Itching. Integumentary (Skin) Complains or has symptoms of Wounds. Denies complaints or symptoms of Bleeding or bruising tendency, Breakdown, Swelling. Musculoskeletal Denies complaints or symptoms of Muscle Pain, Muscle Weakness. Neurologic Denies complaints or symptoms of Numbness/parasthesias, Focal/Weakness. Psychiatric Denies complaints or symptoms of Anxiety, Claustrophobia. Objective Constitutional Sitting or standing Blood Pressure is within target range for  patient.. Pulse regular and within target range for patient.Marland Kitchen Respirations regular, non- labored and within target range.. Temperature is normal and within the target range for the patient.Marland Kitchen appears in no distress. Vitals Time Taken: 10:00 AM, Height: 67 in, Source: Stated, Weight: 357 lbs, Source: Stated, BMI: 55.9, Temperature: 98.3 F, Pulse: 87 bpm, Respiratory Rate: 20 breaths/min, Blood Pressure: 134/81 mmHg. Gastrointestinal (GI) Obese nontender. She has an open surgical area just medial to where her wound is in the inguinal fold on the right. This is where she catheterizes her pouch. General Notes: Wound exam; the patient has a small circular open area in the mid part of her inguinal fold. This has 4 mm of depth and some undermining that is worse at 9:00 [to the left]. There is loss of surface epithelium around the open area however the skin otherwise looks remarkably healthy. There is no evidence of cellulitis either bacterial or fungal. No tenderness. No subcutaneous involvement. No palpable mass and no erythema Integumentary (Hair, Skin) Wound #1 status is Open. Original cause of wound was Gradually Appeared. The date acquired was: 04/29/2021. The wound is located on the Right Groin. The wound measures 0.3cm length x 0.7cm width x 0.4cm depth; 0.165cm^2 area and 0.066cm^3 volume. There is Fat Layer (Subcutaneous Tissue) exposed. Tunneling has been noted at 3:00 with a maximum distance of 0.8cm. Undermining begins at 1:00 and ends at 1:00 with a maximum distance of 0.4cm. There is a medium amount of serosanguineous drainage noted. There is large (67-100%) pink, pale Kolenovic, Joneisha K. ([id number]) granulation within the wound bed. There is no necrotic tissue within the wound bed. Assessment Active Problems ICD-10 Non-pressure chronic ulcer of skin of other sites with other specified severity Cutaneous abscess of groin Procedures Wound #1 Pre-procedure diagnosis of Wound #1 is an  Atypical located on the Right Groin . There was a Chemical/Enzymatic/Mechanical debridement performed by Ricard Dillon, MD. With the following instrument(s): saline and gauze. Other agent used was saline and gauze. A time out was conducted at 10:59, prior to the start of the procedure. A Minimum amount of bleeding was controlled with Pressure. The procedure was tolerated well. Post Debridement Measurements: 0.3cm length x 0.7cm width x 0.4cm depth; 0.066cm^3 volume. Character of Wound/Ulcer Post Debridement is stable. Post procedure Diagnosis Wound #1: Same as Pre-Procedure Plan Follow-up Appointments: Wound #1 Right Groin: Return Appointment in 1 week. Additional Orders / Instructions: Other: - Roll face cloth into fold to keep skin apart. WOUND #1: - Groin Wound Laterality: Right Cleanser: Soap and Water 1 x Per Day/30 Days Discharge Instructions: Gently cleanse wound with antibacterial soap, rinse and pat dry prior to dressing wounds Primary Dressing: Silvercel Small 2x2 (in/in) (DME) (Generic) 1 x Per Day/30 Days Discharge Instructions: Apply Silvercel Small 2x2 (in/in) as instructed Secondary Dressing: ABD Pad 5x9 (in/in) (DME) (Generic) 1 x Per Day/30 Days Discharge Instructions: Cover with ABD pad 1. I am uncertain whether this area started as a small abscess or whether some  of the skin surrounding this could have been aggravated by an allergic reaction to the Neosporin she was using. In any case she has a small punched-out area with roughly 4 mm of direct depth but was some undermining. The wound itself is deep within her pannus fold over the mid inguinal area. 2. I could detect no subcutaneous involvement here no palpable mass no drainage I did not think anything needed to be cultured. 3. We are going to use silver alginate on this. The big issue here will be separating the pannus fold from her anterior thigh to give this a chance to heal. 4. She is rigorous and separating this  from her fecal diversionary site 5. She says she spends most of her time in bed so this should not be a problem in separating the areas. Electronic Signature(s) Signed: 05/04/2021 3:42:13 PM By: Linton Ham MD Entered By: Linton Ham on 05/04/2021 11:32:13 [name] ([id number]) -------------------------------------------------------------------------------- ROS/PFSH Details Patient Name: [name]. Date of Service: 05/04/2021 9:45 AM Medical Record Number: [id number] Patient Account Number: [id number] Date of Birth/Sex: Apr 27, 1966 (55 y.o. F) Treating RN: Primary Care Provider: Clayborn Bigness Other Clinician: Referring Provider: Clayborn Bigness Treating Provider/Extender: Tito Dine in Treatment: 0 Information Obtained From Patient Constitutional Symptoms (General Health) Complaints and Symptoms: Negative for: Fatigue; Fever; Chills; Marked Weight Change Eyes Complaints and Symptoms: Negative for: Dry Eyes; Vision Changes; Glasses / Contacts Medical History: Negative for: Cataracts; Glaucoma; Optic Neuritis Ear/Nose/Mouth/Throat Complaints and Symptoms: Negative for: Difficult clearing ears; Sinusitis Medical History: Negative for: Chronic sinus problems/congestion; Middle ear problems Hematologic/Lymphatic Complaints and Symptoms: Negative for: Bleeding / Clotting Disorders; Human Immunodeficiency Virus Medical History: Negative for: Anemia; Hemophilia; Human Immunodeficiency Virus; Lymphedema; Sickle Cell Disease Respiratory Complaints and Symptoms: Negative for: Chronic or frequent coughs; Shortness of Breath Medical History: Positive for: Sleep Apnea - 10+ years Negative for: Aspiration; Asthma; Chronic Obstructive Pulmonary Disease (COPD); Tuberculosis Cardiovascular Complaints and Symptoms: Negative for: Chest pain; LE edema Medical History: Positive for: Hypertension; Peripheral Venous Disease Negative for: Angina; Arrhythmia; Congestive  Heart Failure; Coronary Artery Disease; Deep Vein Thrombosis; Hypotension; Myocardial Infarction; Peripheral Arterial Disease; Phlebitis; Vasculitis Past Medical History Notes: 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 ([id number]) Endocrine Complaints and Symptoms: Negative for: Hepatitis; Thyroid disease; Polydypsia (Excessive Thirst) Medical History: Negative for: Type I Diabetes; Type II Diabetes Past Medical History Notes: hypothyroidism Genitourinary Complaints and Symptoms: Negative for: Kidney failure/ Dialysis; Incontinence/dribbling Medical History: Negative for: End Stage Renal Disease Immunological Complaints and Symptoms: Negative for: Hives; Itching Medical History: Negative for: Lupus Erythematosus; Raynaudos; Scleroderma Integumentary (Skin) Complaints and Symptoms: Positive for: Wounds Negative for: Bleeding or bruising tendency; Breakdown; Swelling Medical History: Negative for: History of Burn; History of pressure wounds Musculoskeletal Complaints and Symptoms: Negative for: Muscle Pain; Muscle Weakness Medical History: Negative for: Gout; Rheumatoid Arthritis; Osteoarthritis; Osteomyelitis Neurologic Complaints and Symptoms: Negative for: Numbness/parasthesias; Focal/Weakness Medical History: Negative for: Dementia; Neuropathy; Quadriplegia; Paraplegia; Seizure Disorder Psychiatric Complaints and Symptoms: Negative for: Anxiety; Claustrophobia Medical History: Negative for: Anorexia/bulimia; Confinement Anxiety Oncologic Medical History: Negative for: Received Chemotherapy; Received Radiation Immunizations Pneumococcal Vaccine: Received Pneumococcal Vaccination: No Implantable  Devices [name], JUMP. ([id number]) None Family and Social History Cancer: Yes - Maternal Grandparents,Father,Mother,Siblings; Diabetes: No; Heart Disease: Yes - Father; Hereditary  Spherocytosis: No; Hypertension: Yes - Mother,Father; Kidney Disease: No; Lung Disease: No; Seizures: No; Stroke: Yes - Siblings; Thyroid Problems: Yes - Siblings; Tuberculosis: No; Former smoker - started on 12/14/1983 - ended on 03/13/2010; Marital Status - Married; Alcohol Use: Never; Drug Use: No History; Caffeine Use: Moderate; Financial Concerns: No; Food, Clothing or Shelter Needs: No; Support System Lacking: No; Transportation Concerns: No Electronic Signature(s) Signed: 05/04/2021 3:42:13 PM By: Linton Ham MD Signed: 05/07/2021 4:37:07 PM By: Jeanine Luz Entered By: Jeanine Luz on 05/04/2021 10:16:41 [name] ([id number]) -------------------------------------------------------------------------------- SuperBill Details Patient Name: [name]. Date of Service: 05/04/2021 Medical Record Number: [id number] Patient Account Number: [id number] Date of Birth/Sex: March 24, 1966 (55 y.o. F) Treating RN: [name] Primary Care Provider: Clayborn Bigness Other Clinician: Referring Provider: Clayborn Bigness Treating Provider/Extender: Tito Dine in Treatment: 0 Diagnosis Coding ICD-10 Codes Code Description [phone] Non-pressure chronic ulcer of skin of other sites with other specified severity L02.214 Cutaneous abscess of groin Facility Procedures CPT4 Code: [id number] Description: 99214 - WOUND CARE VISIT-LEV 4 EST PT Modifier: Quantity: 1 Physician Procedures CPT4 Code: [id number] Description: WC PHYS LEVEL 3 o NEW PT Modifier: Quantity: 1 CPT4 Code: Description: ICD-10 Diagnosis Description L98.498 Non-pressure chronic ulcer of skin of other sites with other specifie L02.214 Cutaneous abscess of groin Modifier: d severity Quantity: Electronic Signature(s) Signed: 05/04/2021 3:42:13  PM By: Linton Ham MD Entered By: Linton Ham on 05/04/2021 11:32:48

## 2021-05-13 ENCOUNTER — Encounter: Payer: 59 | Attending: Internal Medicine | Admitting: Internal Medicine

## 2021-05-13 ENCOUNTER — Other Ambulatory Visit: Payer: Self-pay

## 2021-05-13 DIAGNOSIS — Z9884 Bariatric surgery status: Secondary | ICD-10-CM | POA: Diagnosis not present

## 2021-05-13 DIAGNOSIS — Z87891 Personal history of nicotine dependence: Secondary | ICD-10-CM | POA: Insufficient documentation

## 2021-05-13 DIAGNOSIS — Z993 Dependence on wheelchair: Secondary | ICD-10-CM | POA: Insufficient documentation

## 2021-05-13 DIAGNOSIS — G4730 Sleep apnea, unspecified: Secondary | ICD-10-CM | POA: Diagnosis not present

## 2021-05-13 DIAGNOSIS — L98498 Non-pressure chronic ulcer of skin of other sites with other specified severity: Secondary | ICD-10-CM | POA: Diagnosis present

## 2021-05-13 DIAGNOSIS — L02214 Cutaneous abscess of groin: Secondary | ICD-10-CM | POA: Insufficient documentation

## 2021-05-13 DIAGNOSIS — Z8616 Personal history of COVID-19: Secondary | ICD-10-CM | POA: Insufficient documentation

## 2021-05-13 DIAGNOSIS — I10 Essential (primary) hypertension: Secondary | ICD-10-CM | POA: Diagnosis not present

## 2021-05-13 NOTE — Progress Notes (Signed)
ARDIE, DRAGOO ([id number]) Visit Report for 05/13/2021 HPI Details Patient Name: [name], [name]. Date of Service: 05/13/2021 3:30 PM Medical Record Number: [id number] Patient Account Number: [id number] Date of Birth/Sex: 1966-04-07 (55 y.o. F) Treating RN: Cornell Barman Primary Care Provider: Clayborn Bigness Other Clinician: Referring Provider: Clayborn Bigness Treating Provider/Extender: Tito Dine in Treatment: 1 History of Present Illness HPI Description: ADMISSION 05/04/2021 This is a 55 year old woman who is here for our review of a recent open area and her right inguinal area. She says this began about a week ago when she felt an irritation there. She often gets this under her breasts and she uses Desitin cream she put Desitin cream on this. It did not help. Then she applied Neosporin to the area which opened into a small wound. She does not have a history of skin areas in her inguinal areas. 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 She is meticulous about making sure that this does not contaminate the open area we are dealing with. In any case she was seen in the ER on 04/28/2021 and referred here. 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 As mentioned she had a total colectomy at age 28. It sounds as though she is largely wheelchair-bound. 6/1; this patient had a small open area in the pannus fold in her right groin. This is totally close this week.  She has been meticulous about keeping this clean and dry. Use silver alginate to the wound. The area is epithelialized Electronic Signature(s) Signed: 05/13/2021 5:08:05 PM By: Linton Ham MD Entered By: Linton Ham on 05/13/2021 16:40:54 [name] ([id number]) -------------------------------------------------------------------------------- Physical Exam Details Patient Name: [name]. Date of Service: 05/13/2021 3:30 PM Medical Record Number: [id number] Patient Account Number: [id number] Date of Birth/Sex: May 26, 1966 (55 y.o. F) Treating RN: Cornell Barman Primary Care Provider: Clayborn Bigness Other Clinician: Referring Provider: Clayborn Bigness Treating Provider/Extender: Tito Dine in Treatment: 1 Constitutional Sitting or standing Blood Pressure is within target range for patient.. Pulse regular and within target range for patient.Marland Kitchen Respirations regular, non- labored and within target range.. Temperature is normal and within the target range for the patient.Marland Kitchen appears in no distress. Notes Wound exam; small circular area in her right inguinal fold that had 4 mm of depth this is totally closed this week nice finding. There is no evidence of surrounding intertrigo fungal or bacterial cellulitis. The skin actually looks quite clean and the area Electronic Signature(s) Signed: 05/13/2021 5:08:05 PM By: Linton Ham MD Entered By: Linton Ham on 05/13/2021 16:41:47 [name] ([id number]) -------------------------------------------------------------------------------- Physician Orders Details Patient Name: [name]. Date of Service: 05/13/2021 3:30 PM Medical Record Number: [id number] Patient Account Number: [id number] Date of Birth/Sex: 04-16-1966 (55 y.o. F) Treating RN: Dolan Amen Primary Care Provider: Clayborn Bigness Other Clinician: Referring Provider: Clayborn Bigness Treating Provider/Extender: Tito Dine in Treatment: 1 Verbal / Phone  Orders: No Diagnosis Coding Discharge From [facility] o Discharge from [place]  Complete Wound Treatment Electronic Signature(s) Signed: 05/13/2021 4:52:16 PM By: Georges Mouse, Minus Breeding RN Signed: 05/13/2021 5:08:05 PM By: Linton Ham MD Entered By: Georges Mouse, Minus Breeding on 05/13/2021 16:18:52 [name] ([id number]) -------------------------------------------------------------------------------- Problem List Details Patient Name: [name], [name]. Date of Service: 05/13/2021 3:30 PM Medical Record Number: [id number] Patient Account Number: [id number] Date of Birth/Sex: [birth year] (55 y.o. 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 Date of Service: 05/13/2021 3:30 PM Medical Record Number: [id number] Patient Account Number: [id number] Date of Birth/Sex: February 23, 1966 (55 y.o. F) Treating RN: Cornell Barman Primary Care Provider: Clayborn Bigness Other Clinician: Referring Provider: Clayborn Bigness Treating Provider/Extender: Tito Dine in Treatment: 1 Subjective History of Present Illness (HPI) ADMISSION 05/04/2021 This is a 54 year old woman who is here for our review of a recent open area and her right inguinal area. She says this began about a week ago when she felt an  irritation there. She often gets this under her breasts and she uses Desitin cream she put Desitin cream on this. It did not help. Then she applied Neosporin to the area which opened into a small wound. She does not have a history of skin areas in her inguinal areas. 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 She is meticulous about making sure that this does not contaminate the open area we are dealing with. In any case she was seen in the ER on 04/28/2021 and referred here. 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 As mentioned she had a total colectomy at age 72. It sounds as though she is largely wheelchair-bound. 6/1; this patient had a small open area in the pannus fold in her right groin. This is totally close this week. She has been meticulous about keeping this clean and dry. Use silver alginate to the wound. The area is epithelialized Objective Constitutional Sitting or standing Blood Pressure is within target range for patient.. Pulse regular and within target range for patient.Marland Kitchen Respirations regular, non- labored and within target range.. Temperature is normal and within the target range for the patient.Marland Kitchen appears in no distress. Vitals Time Taken: 3:45 PM, Height: 67 in, Weight: 357 lbs, BMI: 55.9, Temperature: 98.2 F, Pulse: 90 bpm, Respiratory Rate: 18 breaths/min, Blood Pressure: 125/49 mmHg. General Notes: Wound exam; small circular area in her  right inguinal fold that had 4 mm of depth this is totally closed this week nice finding. There is no evidence of surrounding intertrigo fungal or bacterial cellulitis. The skin actually looks quite clean and  the area Integumentary (Hair, Skin) Wound #1 status is Healed - Epithelialized. Original cause of wound was Gradually Appeared. The date acquired was: 04/29/2021. The wound has been in treatment 1 weeks. The wound is located on the Right Groin. The wound measures 0cm length x 0cm width x 0cm depth; 0cm^2 area and 0cm^3 volume. There is no tunneling or undermining noted. There is a none present amount of drainage noted. There is no granulation within the wound bed. There is no necrotic tissue within the wound bed. Assessment Active Problems ICD-10 Non-pressure chronic ulcer of skin of other sites with other specified severity Cutaneous abscess of groin NASIYAH, LAVERDIERE ([id number]) Plan Discharge From [facility] Services: Discharge from [facility] Treatment Complete 1. The patient can be discharged from the wound care center 2. I have advised her to keep this area meticulously clean and dry. She has purchased some form of absorbent Hydrologist) Signed: 05/13/2021 5:08:05 PM By: Linton Ham MD Entered By: Linton Ham on 05/13/2021 16:42:21 [name] ([id number]) -------------------------------------------------------------------------------- [place] Details Patient Name: [name]. Date of Service: 05/13/2021 Medical Record Number: [id number] Patient Account Number: [id number] Date of Birth/Sex: September 27, 1966 (55 y.o. F) Treating RN: Dolan Amen Primary Care Provider: Clayborn Bigness Other Clinician: Referring Provider: Clayborn Bigness Treating Provider/Extender: Tito Dine in Treatment: 1 Diagnosis Coding ICD-10 Codes Code Description [phone] Non-pressure chronic ulcer of skin of other sites with other specified severity L02.214  Cutaneous abscess of groin Facility Procedures CPT4 Code: [id number] Description: [phone] - WOUND CARE VISIT-LEV 2 EST PT Modifier: Quantity: 1 Physician Procedures CPT4 Code: 4825003 Description: 70488 - WC PHYS LEVEL 2 - EST PT Modifier: Quantity: 1 CPT4 Code: Description: ICD-10 Diagnosis Description 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

## 2021-05-14 ENCOUNTER — Other Ambulatory Visit: Payer: Self-pay

## 2021-05-14 ENCOUNTER — Other Ambulatory Visit: Payer: Self-pay | Admitting: Nurse Practitioner

## 2021-05-14 DIAGNOSIS — E559 Vitamin D deficiency, unspecified: Secondary | ICD-10-CM

## 2021-05-14 DIAGNOSIS — E538 Deficiency of other specified B group vitamins: Secondary | ICD-10-CM

## 2021-05-14 MED ORDER — FOLIC ACID 1 MG PO TABS: 1.0000 mg | ORAL_TABLET | Freq: Every day | ORAL | 3 refills | Status: DC

## 2021-05-14 MED ORDER — VITAMIN D (ERGOCALCIFEROL) 1.25 MG (50000 UNIT) PO CAPS: ORAL_CAPSULE | ORAL | 3 refills | Status: DC

## 2021-05-14 NOTE — Progress Notes (Signed)
[name], [name] ([id number]) Visit Report for 05/13/2021 Arrival Information Details Patient Name: [name], [name]. Date of Service: 05/13/2021 3:30 PM Medical Record Number: [id number] Patient Account Number: [id number] Date of Birth/Sex: [birth year] (55 y.o. F) Treating RN: Donnamarie Poag Primary Care Sylver Vantassell: Clayborn Bigness Other Clinician: Referring Octavia Velador: Clayborn Bigness Treating Denae Zulueta/Extender: Tito Dine in Treatment: 1 Visit Information History Since Last Visit Added or deleted any medications: No Patient Arrived: Wheel Chair Had a fall or experienced change in No Arrival Time: 15:43 activities of daily living that may affect Accompanied By: daughter risk of falls: Transfer Assistance: None Hospitalized since last visit: No Patient Identification Verified: Yes Has Dressing in Place as Prescribed: Yes Secondary Verification Process Completed: Yes Pain Present Now: No Patient Has Alerts: Yes Patient Alerts: Patient on Blood Thinner Aspirin Plavix Electronic Signature(s) Signed: 05/14/2021 9:13:32 AM By: Donnamarie Poag Entered By: Donnamarie Poag on 05/13/2021 15:47:27 Carbary, [name] ([id number]) -------------------------------------------------------------------------------- Clinic Level of Care Assessment Details Patient Name: [name]. Date of Service: 05/13/2021 3:30 PM Medical Record Number: [id number] Patient Account Number: [id number] Date of Birth/Sex: 21-May-1966 (55 y.o. F) Treating RN: Dolan Amen Primary Care Lashandra Arauz: Clayborn Bigness Other Clinician: Referring Chamberlain Steinborn: Clayborn Bigness Treating Terryl Niziolek/Extender: Tito Dine in Treatment: 1 Clinic Level of Care Assessment Items TOOL 4 Quantity Score X - Use when only an EandM is performed on FOLLOW-UP visit 1 0 ASSESSMENTS - Nursing Assessment / Reassessment X - Reassessment of Co-morbidities (includes updates in patient status) 1 10 X- 1 5 Reassessment of Adherence to Treatment Plan ASSESSMENTS -  Wound and Skin Assessment / Reassessment []  - Simple Wound Assessment / Reassessment - one wound 0 []  - 0 Complex Wound Assessment / Reassessment - multiple wounds []  - 0 Dermatologic / Skin Assessment (not related to wound area) ASSESSMENTS - Focused Assessment []  - Circumferential Edema Measurements - multi extremities 0 []  - 0 Nutritional Assessment / Counseling / Intervention []  - 0 Lower Extremity Assessment (monofilament, tuning fork, pulses) []  - 0 Peripheral Arterial Disease Assessment (using hand held doppler) ASSESSMENTS - Ostomy and/or Continence Assessment and Care []  - Incontinence Assessment and Management 0 []  - 0 Ostomy Care Assessment and Management (repouching, etc.) PROCESS - Coordination of Care X - Simple Patient / Family Education for ongoing care 1 15 []  - 0 Complex (extensive) Patient / Family Education for ongoing care []  - 0 Staff obtains Programmer, systems, Records, Test Results / Process Orders []  - 0 Staff telephones HHA, Nursing Homes / Clarify orders / etc []  - 0 Routine Transfer to another Facility (non-emergent condition) []  - 0 Routine Hospital Admission (non-emergent condition) []  - 0 New Admissions / Biomedical engineer / Ordering NPWT, Apligraf, etc. []  - 0 Emergency Hospital Admission (emergent condition) X- 1 10 Simple Discharge Coordination []  - 0 Complex (extensive) Discharge Coordination PROCESS - Special Needs []  - Pediatric / Minor Patient Management 0 []  - 0 Isolation Patient Management []  - 0 Hearing / Language / Visual special needs []  - 0 Assessment of Community assistance (transportation, D/C planning, etc.) []  - 0 Additional assistance / Altered mentation []  - 0 Support Surface(s) Assessment (bed, cushion, seat, etc.) INTERVENTIONS - Wound Cleansing / Measurement Mickelsen, Lennox K. ([id number]) []  - 0 Simple Wound Cleansing - one wound []  - 0 Complex Wound Cleansing - multiple wounds []  - 0 Wound Imaging (photographs -  any number of wounds) []  - 0 Wound Tracing (instead of photographs) []  - 0 Simple Wound Measurement - one wound []  -  0 Complex Wound Measurement - multiple wounds INTERVENTIONS - Wound Dressings []  - Small Wound Dressing one or multiple wounds 0 []  - 0 Medium Wound Dressing one or multiple wounds []  - 0 Large Wound Dressing one or multiple wounds []  - 0 Application of Medications - topical []  - 0 Application of Medications - injection INTERVENTIONS - Miscellaneous []  - External ear exam 0 []  - 0 Specimen Collection (cultures, biopsies, blood, body fluids, etc.) []  - 0 Specimen(s) / Culture(s) sent or taken to Lab for analysis []  - 0 Patient Transfer (multiple staff / Harrel Lemon Lift / Similar devices) []  - 0 Simple Staple / Suture removal (25 or less) []  - 0 Complex Staple / Suture removal (26 or more) []  - 0 Hypo / Hyperglycemic Management (close monitor of Blood Glucose) []  - 0 Ankle / Brachial Index (ABI) - do not check if billed separately X- 1 5 Vital Signs Has the patient been seen at the hospital within the last three years: Yes Total Score: 45 Level Of Care: New/Established - Level 2 Electronic Signature(s) Signed: 05/13/2021 4:52:16 PM By: Georges Mouse, Minus Breeding RN Entered By: Georges Mouse, Kenia on 05/13/2021 16:19:09 [name] ([id number]) -------------------------------------------------------------------------------- Encounter Discharge Information Details Patient Name: [name]. Date of Service: 05/13/2021 3:30 PM Medical Record Number: [id number] Patient Account Number: [id number] Date of Birth/Sex: 1966-05-03 (55 y.o. F) Treating RN: Dolan Amen Primary Care Aryan Bello: Clayborn Bigness Other Clinician: Referring Bernadette Armijo: Clayborn Bigness Treating Anayiah Howden/Extender: Tito Dine in Treatment: 1 Encounter Discharge Information Items Discharge Condition: Stable Ambulatory Status: Ambulatory Discharge Destination: Home Transportation:  Private Auto Accompanied By: daughter Schedule Follow-up Appointment: No Clinical Summary of Care: Electronic Signature(s) Signed: 05/13/2021 4:52:16 PM By: Georges Mouse, Minus Breeding RN Entered By: Georges Mouse, Minus Breeding on 05/13/2021 16:19:59 [name] ([id number]) -------------------------------------------------------------------------------- Lower Extremity Assessment Details Patient Name: [name]. Date of Service: 05/13/2021 3:30 PM Medical Record Number: [id number] Patient Account Number: [id number] Date of Birth/Sex: 11/02/1966 (55 y.o. F) Treating RN: Donnamarie Poag Primary Care Kania Regnier: Clayborn Bigness Other Clinician: Referring Alberta Lenhard: Clayborn Bigness Treating Kamran Coker/Extender: Tito Dine in Treatment: 1 Electronic Signature(s) Signed: 05/14/2021 9:13:32 AM By: Donnamarie Poag Entered By: Donnamarie Poag on 05/13/2021 15:57:00 [name] ([id number]) -------------------------------------------------------------------------------- Multi Wound Chart Details Patient Name: [name]. Date of Service: 05/13/2021 3:30 PM Medical Record Number: [id number] Patient Account Number: [id number] Date of Birth/Sex: 01/23/1966 (55 y.o. F) Treating RN: Dolan Amen Primary Care Chestine Belknap: Clayborn Bigness Other Clinician: Referring Dio Giller: Clayborn Bigness Treating Vaniah Chambers/Extender: Tito Dine in Treatment: 1 Vital Signs Height(in): 62 Pulse(bpm): 70 Weight(lbs): 416 Blood Pressure(mmHg): 125/49 Body Mass Index(BMI): 56 Temperature(F): 98.2 Respiratory Rate(breaths/min): 18 Photos: [N/A:N/A] Wound Location: Right Groin N/A N/A Wounding Event: Gradually Appeared N/A N/A Primary Etiology: Atypical N/A N/A Comorbid History: Sleep Apnea, Hypertension, N/A N/A Peripheral Venous Disease, Colitis Date Acquired: 04/29/2021 N/A N/A Weeks of Treatment: 1 N/A N/A Wound Status: Healed - Epithelialized N/A N/A Measurements L x W x D (cm) 0x0x0 N/A N/A Area (cm) : 0 N/A  N/A Volume (cm) : 0 N/A N/A % Reduction in Area: 100.00% N/A N/A % Reduction in Volume: 100.00% N/A N/A Classification: Full Thickness Without Exposed N/A N/A Support Structures Exudate Amount: None Present N/A N/A Granulation Amount: None Present (0%) N/A N/A Necrotic Amount: None Present (0%) N/A N/A Exposed Structures: Fascia: No N/A N/A Fat Layer (Subcutaneous Tissue): No Tendon: No Muscle: No Joint: No Bone: No Epithelialization: Large (67-100%) N/A N/A Treatment Notes  Wound #1 (Groin) Wound Laterality: Right Cleanser Peri-Wound Care Topical Primary Dressing Secondary Dressing Secured With BRITANIE, HARSHMAN ([id number]) Compression Wrap Compression Stockings Add-Ons Electronic Signature(s) Signed: 05/13/2021 5:08:05 PM By: Linton Ham MD Entered By: Linton Ham on 05/13/2021 16:40:19 [name] ([id number]) -------------------------------------------------------------------------------- [place] Details Patient Name: [name], [name]. Date of Service: 05/13/2021 3:30 PM Medical Record Number: [id number] Patient Account Number: [id number] Date of Birth/Sex: 18-Sep-1966 (55 y.o. F) Treating RN: Dolan Amen Primary Care Jessi Jessop: Clayborn Bigness Other Clinician: Referring Chanler Schreiter: Clayborn Bigness Treating Hovanes Hymas/Extender: Tito Dine in Treatment: 1 Active Inactive Electronic Signature(s) Signed: 05/13/2021 4:52:16 PM By: Georges Mouse, Minus Breeding RN Entered By: Georges Mouse, Minus Breeding on 05/13/2021 16:18:25 [name] ([id number]) -------------------------------------------------------------------------------- Pain Assessment Details Patient Name: [name]. Date of Service: 05/13/2021 3:30 PM Medical Record Number: [id number] Patient Account Number: [id number] Date of Birth/Sex: Sep 03, 1966 (55 y.o. F) Treating RN: Donnamarie Poag Primary Care Reana Chacko: Clayborn Bigness Other Clinician: Referring Zeth Buday: Clayborn Bigness Treating  Taronda Comacho/Extender: Tito Dine in Treatment: 1 Active Problems Location of Pain Severity and Description of Pain Patient Has Paino No Site Locations Rate the pain. Current Pain Level: 0 Pain Management and Medication Current Pain Management: Electronic Signature(s) Signed: 05/14/2021 9:13:32 AM By: Donnamarie Poag Entered By: Donnamarie Poag on 05/13/2021 15:53:03 [name] ([id number]) -------------------------------------------------------------------------------- Patient/Caregiver Education Details Patient Name: [name]. Date of Service: 05/13/2021 3:30 PM Medical Record Number: [id number] Patient Account Number: [id number] Date of Birth/Gender: 11/09/1966 (55 y.o. F) Treating RN: Dolan Amen Primary Care Physician: Clayborn Bigness Other Clinician: Referring Physician: Clayborn Bigness Treating Physician/Extender: Tito Dine in Treatment: 1 Education Assessment Education Provided To: Patient Education Topics Provided Notes discharge instructions Electronic Signature(s) Signed: 05/13/2021 4:52:16 PM By: Georges Mouse, Minus Breeding RN Entered By: Georges Mouse, Minus Breeding on 05/13/2021 16:19:29 [name] ([id number]) -------------------------------------------------------------------------------- Wound Assessment Details Patient Name: [name]. Date of Service: 05/13/2021 3:30 PM Medical Record Number: [id number] Patient Account Number: [id number] Date of Birth/Sex: [birth year] (55 y.o. F) Treating RN: Donnamarie Poag Primary Care Laterria Lasota: Clayborn Bigness Other Clinician: Referring Marcoantonio Legault: Clayborn Bigness Treating Aubreanna Percle/Extender: Tito Dine in Treatment: 1 Wound Status Wound Number: 1 Primary Atypical Etiology: Wound Location: Right Groin Wound Status: Healed - Epithelialized Wounding Event: Gradually Appeared Comorbid Sleep Apnea, Hypertension, Peripheral Venous Date Acquired: 04/29/2021 History: Disease, Colitis Weeks Of Treatment:  1 Clustered Wound: No Photos Wound Measurements Length: (cm) 0 Width: (cm) 0 Depth: (cm) 0 Area: (cm) 0 Volume: (cm) 0 % Reduction in Area: 100% % Reduction in Volume: 100% Epithelialization: Large (67-100%) Tunneling: No Undermining: No Wound Description Classification: Full Thickness Without Exposed Support Structures Exudate Amount: None Present Foul Odor After Cleansing: No Slough/Fibrino No Wound Bed Granulation Amount: None Present (0%) Exposed Structure Necrotic Amount: None Present (0%) Fascia Exposed: No Fat Layer (Subcutaneous Tissue) Exposed: No Tendon Exposed: No Muscle Exposed: No Joint Exposed: No Bone Exposed: No Treatment Notes Wound #1 (Groin) Wound Laterality: Right Cleanser Peri-Wound Care Topical Primary Dressing [name], [name] ([id number]) Secondary Dressing Secured With Compression Wrap Compression Stockings Add-Ons Electronic Signature(s) Signed: 05/13/2021 4:52:16 PM By: Georges Mouse, Minus Breeding RN Signed: 05/14/2021 9:13:32 AM By: Donnamarie Poag Entered By: Georges Mouse, Minus Breeding on 05/13/2021 16:21:44 [name] ([id number]) -------------------------------------------------------------------------------- Vitals Details Patient Name: [name]. Date of Service: 05/13/2021 3:30 PM Medical Record Number: [id number] Patient Account Number: [id number] Date of Birth/Sex: 1966/03/08 (56 y.o. F) Treating RN: Donnamarie Poag Primary Care Jolonda Gomm: Clayborn Bigness  Other Clinician: Referring Jayvyn Haselton: Clayborn Bigness Treating Hero Mccathern/Extender: Tito Dine in Treatment: 1 Vital Signs Time Taken: 15:45 Temperature (F): 98.2 Height (in): 67 Pulse (bpm): 90 Weight (lbs): 357 Respiratory Rate (breaths/min): 18 Body Mass Index (BMI): 55.9 Blood Pressure (mmHg): 125/49 Reference Range: 80 - 120 mg / dl Electronic Signature(s) Signed: 05/14/2021 9:13:32 AM By: Donnamarie Poag Entered ByDonnamarie Poag on 05/13/2021 15:52:56

## 2021-05-14 NOTE — Telephone Encounter (Signed)
Pt called and advised that the wound clinic she was seeing for the open wound, they told her that the wound has closed up and that she doesn't need to be seen again unless she has another issue.

## 2021-05-18 ENCOUNTER — Telehealth: Payer: Self-pay

## 2021-05-18 NOTE — Telephone Encounter (Signed)
Faxed PT referral to [facility] at [phone]. [name]

## 2021-05-19 ENCOUNTER — Ambulatory Visit: Payer: 59 | Admitting: Physician Assistant

## 2021-05-22 ENCOUNTER — Other Ambulatory Visit: Payer: Self-pay | Admitting: Obstetrics and Gynecology

## 2021-05-22 MED ORDER — NITROFURANTOIN MONOHYD MACRO 100 MG PO CAPS: 100.0000 mg | ORAL_CAPSULE | Freq: Two times a day (BID) | ORAL | 0 refills | Status: AC

## 2021-05-22 NOTE — Progress Notes (Signed)
UTI symptoms empiric treatment with macrobid

## 2021-05-26 ENCOUNTER — Ambulatory Visit: Payer: 59 | Admitting: Physician Assistant

## 2021-06-02 ENCOUNTER — Ambulatory Visit: Payer: 59 | Admitting: Nurse Practitioner

## 2021-06-03 ENCOUNTER — Encounter: Payer: Self-pay | Admitting: Advanced Practice Midwife

## 2021-06-03 ENCOUNTER — Ambulatory Visit: Payer: 59 | Admitting: Obstetrics and Gynecology

## 2021-06-03 ENCOUNTER — Other Ambulatory Visit: Payer: Self-pay

## 2021-06-03 ENCOUNTER — Other Ambulatory Visit (HOSPITAL_COMMUNITY)
Admission: RE | Admit: 2021-06-03 | Discharge: 2021-06-03 | Disposition: A | Discharge status: Home / Self Care | Payer: 59 | Source: Ambulatory Visit | Attending: Advanced Practice Midwife | Admitting: Advanced Practice Midwife

## 2021-06-03 ENCOUNTER — Ambulatory Visit (INDEPENDENT_AMBULATORY_CARE_PROVIDER_SITE_OTHER): Payer: 59 | Admitting: Advanced Practice Midwife

## 2021-06-03 VITALS — BP 130/76 | HR 91 | Ht 67.0 in | Wt 357.0 lb

## 2021-06-03 DIAGNOSIS — R300 Dysuria: Secondary | ICD-10-CM

## 2021-06-03 DIAGNOSIS — N898 Other specified noninflammatory disorders of vagina: Secondary | ICD-10-CM

## 2021-06-03 LAB — POCT URINALYSIS DIPSTICK
Appearance: ABNORMAL
Bilirubin, UA: NEGATIVE
Blood, UA: NEGATIVE
Glucose, UA: NEGATIVE
Ketones, UA: NEGATIVE
Nitrite, UA: NEGATIVE
Odor: NORMAL
Protein, UA: NEGATIVE
Spec Grav, UA: 1.02 (ref 1.010–1.025)
Urobilinogen, UA: 0.2 E.U./dL
pH, UA: 5 (ref 5.0–8.0)

## 2021-06-03 MED ORDER — CLOTRIMAZOLE-BETAMETHASONE 1-0.05 % EX CREA: 1.0000 "application " | TOPICAL_CREAM | Freq: Two times a day (BID) | CUTANEOUS | 0 refills | Status: DC

## 2021-06-03 NOTE — Progress Notes (Signed)
Yeast Infection. Burning, itching with blood. Took 5 diflucan and used OTC cream ointment. Last used on Thursday 05/28/21.

## 2021-06-04 ENCOUNTER — Encounter: Payer: Self-pay | Admitting: Advanced Practice Midwife

## 2021-06-04 DIAGNOSIS — K638219 Small intestinal bacterial overgrowth, unspecified: Secondary | ICD-10-CM | POA: Insufficient documentation

## 2021-06-04 DIAGNOSIS — K6389 Other specified diseases of intestine: Secondary | ICD-10-CM | POA: Insufficient documentation

## 2021-06-04 LAB — CERVICOVAGINAL ANCILLARY ONLY
Bacterial Vaginitis (gardnerella): NEGATIVE
Candida Glabrata: NEGATIVE
Candida Vaginitis: NEGATIVE
Comment: NEGATIVE
Comment: NEGATIVE
Comment: NEGATIVE

## 2021-06-04 NOTE — Progress Notes (Addendum)
Patient ID: [name], female   DOB: 04/08/1966, 55 y.o.   MRN: [id number]  Reason for Consult: Vaginitis (Medicine not working)    Subjective:  Date of Service: 06/03/2021  HPI:  [name] is a 55 y.o. female being seen for 2 weeks of vaginitis symptoms. She received a prescription from PCP for diflucan 200 mg daily x 14 days on 6/16 and she has taken 5 doses since then. She reports no relief in symptoms in that time. She has also used an over the counter vaginal yeast cream (uncertain name) without much relief. Her current symptoms include itching and irritation along with a yellow discharge. She denies odor. She has some pain with urination also. She reports the moderate discomfort is primarily external. We discussed doing vaginitis lab and waiting for results for best treatment. She agrees to plan. Also discussed some comfort measures. She has limited mobility and is unable to use bathtub for apple cider vinegar soaks. Suggested she use compresses instead of sitz bath.   Past Medical History:  Diagnosis Date   Berger's disease 2011   Buerger's disease ([place])    Family history of adverse reaction to anesthesia    sister and daughter difficult to wake up   Folic acid deficiency    GERD (gastroesophageal reflux disease)    Hypertension    Hypothyroidism    IDA (iron deficiency anemia)    Osteoarthritis    Thrombocytosis    Family History  Problem Relation Age of Onset   Hypertension Mother    Congestive Heart Failure Father    Hypertension Father    Stroke Sister    Skin cancer Maternal Aunt        Basal cell   Skin cancer Maternal Uncle        Basal cell   Congestive Heart Failure Maternal Grandmother    Colon cancer Maternal Grandfather 85   Skin cancer Cousin        Basal Cell   Past Surgical History:  Procedure Laterality Date   ADENOIDECTOMY     Small child   APPENDECTOMY     Age 3   COLON SURGERY  1980   ileostomy and internal pouch age 65   [place] N/A 05/02/2015   Procedure: [place];  Surgeon: Malachy Mood, MD;  Location: [facility] ORS;  Service: Gynecology;  Laterality: N/A;   LAPAROSCOPIC GASTRIC SLEEVE RESECTION  07/06/2016    Short Social History:  Social History   Tobacco Use   Smoking status: Former    Pack years: 0.00    Types: Cigarettes    Quit date: 04/28/2010    Years since quitting: 11.1   Smokeless tobacco: Never  Substance Use Topics   Alcohol use: No    Alcohol/week: 0.0 standard drinks    Allergies  Allergen Reactions   Tape Rash    Some tapes cause a rash, possible the paper tape.    Current Outpatient Medications  Medication Sig Dispense Refill   amLODipine (NORVASC) 10 MG tablet TAKE ONE TABLET BY MOUTH EVERY NIGHT AT BEDTIME 90 tablet 3   cilostazol (PLETAL) 50 MG tablet Take 50 mg by mouth 2 (two) times daily.     clotrimazole-betamethasone (LOTRISONE) cream Apply 1 application topically 2 (two) times daily. 30 g 0   Cranberry 1000 MG CAPS Take 1 tablet by mouth daily.     [facility] ASPIRIN LOW DOSE 81 MG EC tablet Take 2 tablets (  162 mg total) by mouth daily. 180 tablet 1   famotidine (PEPCID) 20 MG tablet TAKE ONE TABLET BY MOUTH TWICE A DAY 60 tablet 3   folic acid (FOLVITE) 1 MG tablet Take 1 tablet (1 mg total) by mouth daily. 90 tablet 3   levonorgestrel (MIRENA) 20 MCG/24HR IUD by Intrauterine route.     levothyroxine (SYNTHROID) 150 MCG tablet Take 1 tablet (150 mcg total) by mouth daily before breakfast. 90 tablet 1   pantoprazole (PROTONIX) 40 MG tablet Take 1 tablet (40 mg total) by mouth 2 (two) times daily. 180 tablet 3   Vitamin D, Ergocalciferol, (DRISDOL) 1.25 MG (50000 UNIT) CAPS capsule TAKE 1 CAPSULE ONCE A WEEK 12 capsule 3   No current facility-administered medications for this visit.    Review of Systems  Constitutional:  Negative for chills and fever.  HENT:  Negative for congestion, ear discharge, ear  pain, hearing loss, sinus pain and sore throat.   Eyes:  Negative for blurred vision and double vision.  Respiratory:  Negative for cough, shortness of breath and wheezing.   Cardiovascular:  Negative for chest pain, palpitations and leg swelling.  Gastrointestinal:  Negative for abdominal pain, blood in stool, constipation, diarrhea, heartburn, melena, nausea and vomiting.  Genitourinary:  Positive for dysuria. Negative for flank pain, frequency, hematuria and urgency.       Positive for vaginal irritation, itching, discharge  Musculoskeletal:  Negative for back pain, joint pain and myalgias.  Skin:  Negative for itching and rash.  Neurological:  Negative for dizziness, tingling, tremors, sensory change, speech change, focal weakness, seizures, loss of consciousness, weakness and headaches.  Endo/Heme/Allergies:  Negative for environmental allergies. Does not bruise/bleed easily.  Psychiatric/Behavioral:  Negative for depression, hallucinations, memory loss, substance abuse and suicidal ideas. The patient is not nervous/anxious and does not have insomnia.        Objective:  Objective   Vitals:   06/03/21 1430  BP: 130/76  Pulse: 91  Weight: (!) 357 lb (161.9 kg)  Height: 5\' 7"  (1.702 m)   Body mass index is 55.91 kg/m. Constitutional: Obese female in no acute distress.  HEENT: normal Skin: Warm and dry.  Cardiovascular: Regular rate and rhythm.   Respiratory: Clear to auscultation bilateral. Normal respiratory effort Neuro: DTRs 2+, Cranial nerves grossly intact Psych: Alert and Oriented x3. No memory deficits. Normal mood and affect.  MS: normal gait, normal bilateral lower extremity ROM/strength/stability.  Pelvic exam: (female chaperone present) is limited by body habitus EGBUS: within normal limits Vagina: normal mucosa, no blood in the vault, thick creamy discharge/residue from OTC cream   Results for VENOLA, CASTELLO (MRN [id number]) as of 06/04/2021 13:07  Ref. Range  06/03/2021 15:40  Appearance Unknown abnormal  Bilirubin, UA Unknown neg  Clarity, UA Unknown cloudy  Color, UA Unknown amber  Glucose Latest Ref Range: Negative  Negative  Ketones, UA Unknown neg  Leukocytes,UA Latest Ref Range: Negative  Small (1+) (A)  Nitrite, UA Unknown neg  pH, UA Latest Ref Range: 5.0 - 8.0  5.0  Protein,UA Latest Ref Range: Negative  Negative  Specific Gravity, UA Latest Ref Range: 1.010 - 1.025  1.020  Urobilinogen, UA Latest Ref Range: 0.2 or 1.0 E.U./dL 0.2  RBC, UA Unknown neg  Odor Unknown normal    Assessment/Plan:     55 y.o. G2 P25 female with likely vaginitis. UA with small Leukocytes- will send for culture  Aptima swab Follow up as needed for treatment after lab results  Apple cider vinegar soaks as able     [facility] 06/04/2021, 1:04 PM

## 2021-06-04 NOTE — Patient Instructions (Signed)
Vaginitis Vaginitis is irritation and swelling of the vagina. Treatment will depend on the cause. What are the causes? It can be caused by: Bacteria. Yeast. A parasite. A virus. Low hormone levels. Bubble baths, scented tampons, and feminine sprays. Other things can change the balance of the yeast and bacteria that live in the vagina. These include: Antibiotic medicines. Not being clean enough. Some birth control methods. Sex. Infection. Diabetes. A weakened body defense system (immune system). What increases the risk? Smoking or being around someone who smokes. Using washes (douches), scented tampons, or scented pads. Wearing tight pants or thong underwear. Using birth control pills or an IUD. Having sex without a condom or having a lot of partners. Having an STI. Using a certain product to kill sperm (nonoxynol-9). Eating foods that are high in sugar. Having diabetes. Having low levels of a female hormone. Having a weakened body defense system. Being pregnant or breastfeeding. What are the signs or symptoms? Fluid coming from the vagina that is not normal. A bad smell. Itching, pain, or swelling. Pain with sex. Pain or burning when you pee (urinate). Sometimes there are no symptoms. How is this treated? Treatment may include: Antibiotic creams or pills. Antifungal medicines. Medicines to ease symptoms if you have a virus. Your sex partner should also be treated. Estrogen medicines. Avoiding scented soaps, sprays, or douches. Stopping use of products that caused irritation and then using a cream to treat symptoms. Follow these instructions at home: Lifestyle Keep the area around your vagina clean and dry. Avoid using soap. Rinse the area with water. Until your doctor says it is okay: Do not use washes for the vagina. Do not use tampons. Do not have sex. Wipe from front to back after going to the bathroom. When your doctor says it is okay, practice safe sex and  use condoms. General instructions Take over-the-counter and prescription medicines only as told by your doctor. If you were prescribed an antibiotic medicine, take or use it as told by your doctor. Do not stop taking or using it even if you start to feel better. Keep all follow-up visits. How is this prevented? Do not use things that can irritate the vagina, such as fabric softeners. Avoid these products if they are scented: Sprays. Detergents. Tampons. Products for cleaning the vagina. Soaps or bubble baths. Let air reach your vagina. To do this: Wear cotton underwear. Do not wear: Underwear while you sleep. Tight pants. Thong underwear. Underwear or nylons without a cotton panel. Take off any wet clothing, such as bathing suits, as soon as you can. Practice safe sex and use condoms. Contact a doctor if: You have pain in your belly or in the area between your hips. You have a fever or chills. Your symptoms last for more than 2-3 days. Get help right away if: You have a fever and your symptoms get worse all of a sudden. Summary Vaginitis is irritation and swelling of the vagina. Treatment will depend on the cause of the condition. Do not use washes or tampons or have sex until your doctor says it is okay. This information is not intended to replace advice given to you by your health care provider. Make sure you discuss any questions you have with your health care provider. Document Revised: 05/29/2020 Document Reviewed: 05/29/2020 [employer] Patient Education  2022 [employer]  

## 2021-06-06 LAB — URINE CULTURE: Organism ID, Bacteria: NO GROWTH

## 2021-06-11 ENCOUNTER — Inpatient Hospital Stay
Admission: EM | Admit: 2021-06-11 | Discharge: 2021-06-13 | DRG: 287 | Disposition: A | Discharge status: Home / Self Care | Payer: 59 | Attending: Internal Medicine | Admitting: Internal Medicine

## 2021-06-11 ENCOUNTER — Other Ambulatory Visit: Payer: Self-pay

## 2021-06-11 ENCOUNTER — Emergency Department: Payer: 59

## 2021-06-11 ENCOUNTER — Encounter: Payer: Self-pay | Admitting: Emergency Medicine

## 2021-06-11 DIAGNOSIS — Z79899 Other long term (current) drug therapy: Secondary | ICD-10-CM

## 2021-06-11 DIAGNOSIS — I731 Thromboangiitis obliterans [Buerger's disease]: Secondary | ICD-10-CM | POA: Diagnosis present

## 2021-06-11 DIAGNOSIS — Z7982 Long term (current) use of aspirin: Secondary | ICD-10-CM | POA: Diagnosis not present

## 2021-06-11 DIAGNOSIS — I214 Non-ST elevation (NSTEMI) myocardial infarction: Secondary | ICD-10-CM | POA: Diagnosis present

## 2021-06-11 DIAGNOSIS — I10 Essential (primary) hypertension: Secondary | ICD-10-CM | POA: Diagnosis present

## 2021-06-11 DIAGNOSIS — I400 Infective myocarditis: Principal | ICD-10-CM | POA: Diagnosis present

## 2021-06-11 DIAGNOSIS — Z8249 Family history of ischemic heart disease and other diseases of the circulatory system: Secondary | ICD-10-CM

## 2021-06-11 DIAGNOSIS — G4733 Obstructive sleep apnea (adult) (pediatric): Secondary | ICD-10-CM | POA: Diagnosis present

## 2021-06-11 DIAGNOSIS — D509 Iron deficiency anemia, unspecified: Secondary | ICD-10-CM | POA: Diagnosis present

## 2021-06-11 DIAGNOSIS — E038 Other specified hypothyroidism: Secondary | ICD-10-CM | POA: Diagnosis present

## 2021-06-11 DIAGNOSIS — Z8616 Personal history of COVID-19: Secondary | ICD-10-CM

## 2021-06-11 DIAGNOSIS — K219 Gastro-esophageal reflux disease without esophagitis: Secondary | ICD-10-CM | POA: Diagnosis present

## 2021-06-11 DIAGNOSIS — R778 Other specified abnormalities of plasma proteins: Secondary | ICD-10-CM | POA: Diagnosis present

## 2021-06-11 DIAGNOSIS — Z87891 Personal history of nicotine dependence: Secondary | ICD-10-CM | POA: Diagnosis not present

## 2021-06-11 DIAGNOSIS — Z975 Presence of (intrauterine) contraceptive device: Secondary | ICD-10-CM

## 2021-06-11 DIAGNOSIS — Z713 Dietary counseling and surveillance: Secondary | ICD-10-CM | POA: Diagnosis not present

## 2021-06-11 DIAGNOSIS — Z79890 Hormone replacement therapy (postmenopausal): Secondary | ICD-10-CM

## 2021-06-11 DIAGNOSIS — Z9884 Bariatric surgery status: Secondary | ICD-10-CM | POA: Diagnosis not present

## 2021-06-11 DIAGNOSIS — Z9049 Acquired absence of other specified parts of digestive tract: Secondary | ICD-10-CM | POA: Diagnosis not present

## 2021-06-11 DIAGNOSIS — Z91048 Other nonmedicinal substance allergy status: Secondary | ICD-10-CM

## 2021-06-11 DIAGNOSIS — Z20822 Contact with and (suspected) exposure to covid-19: Secondary | ICD-10-CM | POA: Diagnosis present

## 2021-06-11 DIAGNOSIS — Z6841 Body Mass Index (BMI) 40.0 and over, adult: Secondary | ICD-10-CM

## 2021-06-11 DIAGNOSIS — E66813 Obesity, class 3: Secondary | ICD-10-CM

## 2021-06-11 DIAGNOSIS — R2689 Other abnormalities of gait and mobility: Secondary | ICD-10-CM | POA: Diagnosis present

## 2021-06-11 DIAGNOSIS — E063 Autoimmune thyroiditis: Secondary | ICD-10-CM | POA: Diagnosis present

## 2021-06-11 DIAGNOSIS — I739 Peripheral vascular disease, unspecified: Secondary | ICD-10-CM | POA: Diagnosis present

## 2021-06-11 DIAGNOSIS — Z9102 Food additives allergy status: Secondary | ICD-10-CM | POA: Diagnosis not present

## 2021-06-11 LAB — RESP PANEL BY RT-PCR (FLU A&B, COVID) ARPGX2
Influenza A by PCR: NEGATIVE
Influenza B by PCR: NEGATIVE
SARS Coronavirus 2 by RT PCR: NEGATIVE

## 2021-06-11 LAB — CBC
HCT: 42.6 % (ref 36.0–46.0)
Hemoglobin: 14.1 g/dL (ref 12.0–15.0)
MCH: 31.2 pg (ref 26.0–34.0)
MCHC: 33.1 g/dL (ref 30.0–36.0)
MCV: 94.2 fL (ref 80.0–100.0)
Platelets: 292 10*3/uL (ref 150–400)
RBC: 4.52 MIL/uL (ref 3.87–5.11)
RDW: 14.6 % (ref 11.5–15.5)
WBC: 11.8 10*3/uL — ABNORMAL HIGH (ref 4.0–10.5)
nRBC: 0 % (ref 0.0–0.2)

## 2021-06-11 LAB — BASIC METABOLIC PANEL
Anion gap: 11 (ref 5–15)
BUN: 15 mg/dL (ref 6–20)
CO2: 22 mmol/L (ref 22–32)
Calcium: 9 mg/dL (ref 8.9–10.3)
Chloride: 106 mmol/L (ref 98–111)
Creatinine, Ser: 0.9 mg/dL (ref 0.44–1.00)
GFR, Estimated: >60 mL/min (ref >60)
Glucose, Bld: 119 mg/dL — ABNORMAL HIGH (ref 70–99)
Potassium: 3.6 mmol/L (ref 3.5–5.1)
Sodium: 139 mmol/L (ref 135–145)

## 2021-06-11 LAB — PROTIME-INR
INR: 1 (ref 0.8–1.2)
Prothrombin Time: 13.5 seconds (ref 11.4–15.2)

## 2021-06-11 LAB — TROPONIN I (HIGH SENSITIVITY)
Troponin I (High Sensitivity): 26 ng/L — ABNORMAL HIGH (ref <18)
Troponin I (High Sensitivity): 207 ng/L (ref <18)
Troponin I (High Sensitivity): 1560 ng/L (ref <18)
Troponin I (High Sensitivity): 7124 ng/L (ref <18)

## 2021-06-11 LAB — POC URINE PREG, ED: Preg Test, Ur: NEGATIVE

## 2021-06-11 LAB — APTT: aPTT: 25 seconds (ref 24–36)

## 2021-06-11 MED ORDER — ACETAMINOPHEN 325 MG PO TABS: 650.0000 mg | ORAL_TABLET | Freq: Four times a day (QID) | ORAL | Status: DC | PRN

## 2021-06-11 MED ORDER — NITROGLYCERIN IN D5W 200-5 MCG/ML-% IV SOLN
0.0000 ug/min | INTRAVENOUS | Status: DC
  Administered 2021-06-11: 5 ug/min via INTRAVENOUS
  Filled 2021-06-11: qty 250

## 2021-06-11 MED ORDER — HYDROMORPHONE HCL 1 MG/ML IJ SOLN
0.5000 mg | Freq: Once | INTRAMUSCULAR | Status: AC
  Administered 2021-06-11: 0.5 mg via INTRAVENOUS
  Filled 2021-06-11: qty 1

## 2021-06-11 MED ORDER — ACETAMINOPHEN 325 MG PO TABS: 650.0000 mg | ORAL_TABLET | ORAL | Status: DC | PRN

## 2021-06-11 MED ORDER — AMLODIPINE BESYLATE 10 MG PO TABS
10.0000 mg | ORAL_TABLET | Freq: Every day | ORAL | Status: DC
  Administered 2021-06-12: 10 mg via ORAL
  Filled 2021-06-11: qty 1

## 2021-06-11 MED ORDER — METOPROLOL TARTRATE 25 MG PO TABS
25.0000 mg | ORAL_TABLET | Freq: Two times a day (BID) | ORAL | Status: DC
  Administered 2021-06-11: 25 mg via ORAL
  Filled 2021-06-11: qty 1

## 2021-06-11 MED ORDER — LACTATED RINGERS IV SOLN: INTRAVENOUS | Status: DC

## 2021-06-11 MED ORDER — IOHEXOL 350 MG/ML SOLN
100.0000 mL | Freq: Once | INTRAVENOUS | Status: AC | PRN
  Administered 2021-06-11: 100 mL via INTRAVENOUS

## 2021-06-11 MED ORDER — NITROGLYCERIN 0.4 MG SL SUBL
0.4000 mg | SUBLINGUAL_TABLET | SUBLINGUAL | Status: DC | PRN
  Administered 2021-06-11: 0.4 mg via SUBLINGUAL
  Filled 2021-06-11: qty 1

## 2021-06-11 MED ORDER — ASPIRIN 81 MG PO CHEW
324.0000 mg | CHEWABLE_TABLET | Freq: Once | ORAL | Status: AC
  Administered 2021-06-11: 324 mg via ORAL
  Filled 2021-06-11: qty 4

## 2021-06-11 MED ORDER — ROSUVASTATIN CALCIUM 20 MG PO TABS
20.0000 mg | ORAL_TABLET | Freq: Every day | ORAL | Status: DC
  Filled 2021-06-11: qty 1
  Filled 2021-06-11: qty 2

## 2021-06-11 MED ORDER — HYDROMORPHONE HCL 1 MG/ML IJ SOLN
0.5000 mg | INTRAMUSCULAR | Status: DC | PRN
  Administered 2021-06-12 – 2021-06-13 (×4): 0.5 mg via INTRAVENOUS
  Filled 2021-06-11 (×4): qty 1

## 2021-06-11 MED ORDER — MORPHINE SULFATE (PF) 4 MG/ML IV SOLN
4.0000 mg | Freq: Once | INTRAVENOUS | Status: AC
  Administered 2021-06-11: 4 mg via INTRAVENOUS
  Filled 2021-06-11: qty 1

## 2021-06-11 MED ORDER — CILOSTAZOL 100 MG PO TABS
50.0000 mg | ORAL_TABLET | Freq: Two times a day (BID) | ORAL | Status: DC
  Administered 2021-06-13: 50 mg via ORAL
  Filled 2021-06-11 (×6): qty 0.5

## 2021-06-11 MED ORDER — MORPHINE SULFATE (PF) 4 MG/ML IV SOLN
4.0000 mg | Freq: Once | INTRAVENOUS | Status: DC
  Filled 2021-06-11: qty 1

## 2021-06-11 MED ORDER — HEPARIN BOLUS VIA INFUSION
4000.0000 [IU] | Freq: Once | INTRAVENOUS | Status: AC
  Administered 2021-06-11: 4000 [IU] via INTRAVENOUS
  Filled 2021-06-11: qty 4000

## 2021-06-11 MED ORDER — ONDANSETRON HCL 4 MG/2ML IJ SOLN
4.0000 mg | Freq: Once | INTRAMUSCULAR | Status: AC
  Administered 2021-06-11: 4 mg via INTRAVENOUS
  Filled 2021-06-11: qty 2

## 2021-06-11 MED ORDER — ASPIRIN EC 81 MG PO TBEC: 81.0000 mg | DELAYED_RELEASE_TABLET | Freq: Every day | ORAL | Status: DC

## 2021-06-11 MED ORDER — ONDANSETRON HCL 4 MG/2ML IJ SOLN: 4.0000 mg | Freq: Four times a day (QID) | INTRAMUSCULAR | Status: DC | PRN

## 2021-06-11 MED ORDER — HEPARIN (PORCINE) 25000 UT/250ML-% IV SOLN
1400.0000 [IU]/h | INTRAVENOUS | Status: DC
  Administered 2021-06-11: 1400 [IU]/h via INTRAVENOUS
  Filled 2021-06-11: qty 250

## 2021-06-11 MED ORDER — FAMOTIDINE 20 MG PO TABS
20.0000 mg | ORAL_TABLET | Freq: Two times a day (BID) | ORAL | Status: DC
  Administered 2021-06-13: 20 mg via ORAL
  Filled 2021-06-11 (×4): qty 1

## 2021-06-11 MED ORDER — LEVOTHYROXINE SODIUM 50 MCG PO TABS
150.0000 ug | ORAL_TABLET | Freq: Every day | ORAL | Status: DC
  Administered 2021-06-12 – 2021-06-13 (×2): 150 ug via ORAL
  Filled 2021-06-11 (×2): qty 1

## 2021-06-11 MED ORDER — PANTOPRAZOLE SODIUM 40 MG PO TBEC
40.0000 mg | DELAYED_RELEASE_TABLET | Freq: Two times a day (BID) | ORAL | Status: DC
  Administered 2021-06-12 – 2021-06-13 (×3): 40 mg via ORAL
  Filled 2021-06-11 (×4): qty 1

## 2021-06-11 NOTE — ED Provider Notes (Addendum)
[facility] Emergency Department Provider Note  ____________________________________________   Event Date/Time   First MD Initiated Contact with Patient 06/11/21 1722     (approximate)  I have reviewed the triage vital signs and the nursing notes.   HISTORY  Chief Complaint Chest Pain    HPI [name] is a 55 y.o. female with HTN who comes in with chest pain radiating into jaw starting at noon. The jaw hurts more then chest. The pain is moderate, constant, nothing makes it better or worse.  She reports SOB.  No prior cardiac history.  Patient reports having a splenic infarct back in December.  She was seen by hematology and not continued on anticoagulation.  She denies unilateral leg swelling.            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   Obstructive sleep apnea 08/13/2020   Chest pain 08/13/2020   Hypothyroidism due to Hashimoto's thyroiditis 08/13/2020   Gastroesophageal reflux disease 07/21/2020   Elevated amylase 04/06/2020   Generalized abdominal pain 03/23/2020   S/P total colectomy 03/23/2020   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   Vitamin D deficiency 06/06/2019   Peripheral vascular disease ([place]) 10/11/2018   Shortness of breath 06/07/2018   Arthralgia  of both knees 06/07/2018   Decreased functional mobility 06/07/2018   Morbid obesity with BMI of 50.0-59.9, adult ([place]) 06/04/2013   GERD (gastroesophageal reflux disease) 06/04/2013   Buerger's disease ([place]) 06/04/2013   Sleep apnea 06/04/2013   Hypothyroid 05/11/2013   Hypertension 05/11/2013    Past Surgical History:  Procedure Laterality Date   ADENOIDECTOMY     Small child   APPENDECTOMY     Age 69   COLON SURGERY  1980   ileostomy and internal pouch age 19   [place] N/A 05/02/2015   Procedure: [place];  Surgeon: Malachy Mood, MD;  Location: [facility] ORS;  Service: Gynecology;  Laterality: N/A;   LAPAROSCOPIC GASTRIC SLEEVE RESECTION  07/06/2016    Prior to Admission medications   Medication Sig Start Date End Date Taking? Authorizing Provider  amLODipine (NORVASC) 10 MG tablet TAKE ONE TABLET BY MOUTH EVERY NIGHT AT BEDTIME 01/10/21   Lavera Guise, MD  cilostazol (PLETAL) 50 MG tablet Take 50 mg by mouth 2 (two) times daily. 08/07/20 08/07/21  [provider]  clotrimazole-betamethasone (LOTRISONE) cream Apply 1 application topically 2 (two) times daily. 06/03/21   Rod Can, CNM  Cranberry 1000 MG CAPS Take 1 tablet by mouth daily.    [provider]  [facility] ASPIRIN LOW DOSE 81 MG EC tablet Take 2 tablets (162 mg total) by mouth daily. 02/25/21   Lavera Guise, MD  famotidine (PEPCID) 20 MG tablet TAKE  ONE TABLET BY MOUTH TWICE A DAY 11/24/20   Lavera Guise, MD  folic acid (FOLVITE) 1 MG tablet Take 1 tablet (1 mg total) by mouth daily. 05/14/21   Lavera Guise, MD  levonorgestrel [facility]) 20 MCG/24HR IUD by Intrauterine route.    [provider]  levothyroxine (SYNTHROID) 150 MCG tablet Take 1 tablet (150 mcg total) by mouth daily before breakfast. 04/29/21   Lavera Guise, MD  pantoprazole (PROTONIX) 40 MG tablet Take 1 tablet (40 mg total) by mouth 2 (two) times daily.  07/28/20   Lavera Guise, MD  Vitamin D, Ergocalciferol, (DRISDOL) 1.25 MG (50000 UNIT) CAPS capsule TAKE 1 CAPSULE ONCE A WEEK 05/14/21   Lavera Guise, MD    Allergies Tape  Family History  Problem Relation Age of Onset   Hypertension Mother    Congestive Heart Failure Father    Hypertension Father    Stroke Sister    Skin cancer Maternal Aunt        Basal cell   Skin cancer Maternal Uncle        Basal cell   Congestive Heart Failure Maternal Grandmother    Colon cancer Maternal Grandfather 85   Skin cancer Cousin        Basal Cell    Social History Social History   Tobacco Use   Smoking status: Former    Pack years: 0.00    Types: Cigarettes    Quit date: 04/28/2010    Years since quitting: 11.1   Smokeless tobacco: Never  Substance Use Topics   Alcohol use: No    Alcohol/week: 0.0 standard drinks   Drug use: No      Review of Systems Constitutional: No fever/chills Eyes: No visual changes. ENT: No sore throat. Cardiovascular: Positive chest pain Respiratory: Positive shortness of breath Gastrointestinal: No abdominal pain.  No nausea, no vomiting.  No diarrhea.  No constipation. Genitourinary: Negative for dysuria. Musculoskeletal: Negative for back pain. Skin: Negative for rash. Neurological: Negative for headaches, focal weakness or numbness. All other ROS negative ____________________________________________   PHYSICAL EXAM:  VITAL SIGNS: ED Triage Vitals  Enc Vitals Group     BP 06/11/21 1436 (!) 144/81     Pulse Rate 06/11/21 1436 (!) 108     Resp 06/11/21 1436 (!) 22     Temp 06/11/21 1436 99 F (37.2 C)     Temp Source 06/11/21 1436 Oral     SpO2 06/11/21 1436 99 %     Weight 06/11/21 1438 (!) 357 lb (161.9 kg)     Height 06/11/21 1438 5\' 7"  (1.702 m)     Head Circumference --      Peak Flow --      Pain Score 06/11/21 1437 8     Pain Loc --      Pain Edu? --      Excl. in [place]? --     Constitutional: Alert and oriented. Well  appearing and in no acute distress. Eyes: Conjunctivae are normal. EOMI. Head: Atraumatic. Nose: No congestion/rhinnorhea. Mouth/Throat: Mucous membranes are moist.   Neck: No stridor. Trachea Midline. FROM Cardiovascular: Normal rate, regular rhythm. Grossly normal heart sounds.  Good peripheral circulation. Respiratory: Normal respiratory effort.  No retractions. Lungs CTAB. Gastrointestinal: Soft and nontender. No distention. No abdominal bruits.  Musculoskeletal: No lower extremity tenderness nor edema.  No joint effusions. Neurologic:  Normal speech and language. No gross focal neurologic deficits are appreciated.  Skin:  Skin is warm,  dry and intact. No rash noted. Psychiatric: Mood and affect are normal. Speech and behavior are normal. GU: Deferred   ____________________________________________   LABS (all labs ordered are listed, but only abnormal results are displayed)  Labs Reviewed  BASIC METABOLIC PANEL - Abnormal; Notable for the following components:      Result Value   Glucose, Bld 119 (*)    All other components within normal limits  CBC - Abnormal; Notable for the following components:   WBC 11.8 (*)    All other components within normal limits  TROPONIN I (HIGH SENSITIVITY) - Abnormal; Notable for the following components:   Troponin I (High Sensitivity) 26 (*)    All other components within normal limits  TROPONIN I (HIGH SENSITIVITY) - Abnormal; Notable for the following components:   Troponin I (High Sensitivity) 207 (*)    All other components within normal limits  PROTIME-INR  POC URINE PREG, ED   ____________________________________________   ED ECG REPORT I, Vanessa [place], the attending physician, personally viewed and interpreted this ECG.  Sinus tachycardia rate of 108, no st elevation, no twi, normal   Normal sinus rate of 97, no ST elevation, no T wave versions, normal intervals  Normal sinus rate of 84, no ST elevation, no T wave versions,  normal intervals ____________________________________________  RADIOLOGY Robert Bellow, personally viewed and evaluated these images (plain radiographs) as part of my medical decision making, as well as reviewing the written report by the radiologist.  ED MD interpretation:  no  PNA   Official radiology report(s): DG Chest 2 View  Result Date: 06/11/2021 CLINICAL DATA:  Chest pain EXAM: CHEST - 2 VIEW COMPARISON:  04/03/2010 FINDINGS: No focal opacity or pleural effusion. Borderline cardiomegaly with slight central congestion. No pneumothorax. IMPRESSION: Borderline cardiomegaly with slight central congestion Electronically Signed   By: Donavan Foil M.D.   On: 06/11/2021 15:30    ____________________________________________   PROCEDURES  Procedure(s) performed (including Critical Care):  .1-3 Lead EKG Interpretation  Date/Time: 06/11/2021 6:26 PM Performed by: Vanessa [place], MD Authorized by: Vanessa [place], MD     Interpretation: normal     ECG rate:  90s   ECG rate assessment: normal     Rhythm: sinus rhythm     Ectopy: none     Conduction: normal   Comments:     Initially sinus tachycardia but became normal sinus .Critical Care  Date/Time: 06/11/2021 6:26 PM Performed by: Vanessa [place], MD Authorized by: Vanessa , MD   Critical care provider statement:    Critical care time (minutes):  65   Critical care was necessary to treat or prevent imminent or life-threatening deterioration of the following conditions:  Cardiac failure   Critical care was time spent personally by me on the following activities:  Discussions with consultants, evaluation of patient's response to treatment, examination of patient, ordering and performing treatments and interventions, ordering and review of laboratory studies, ordering and review of radiographic studies, pulse oximetry, re-evaluation of patient's condition, obtaining history from patient or surrogate and review of old  charts   ____________________________________________   INITIAL IMPRESSION / [place] / ED COURSE   KARISTA AISPURO was evaluated in Emergency Department on 06/11/2021 for the symptoms described in the history of present illness. She was evaluated in the context of the global COVID-19 pandemic, which necessitated consideration that the patient might be at risk for infection with the SARS-CoV-2 virus that causes COVID-19. Institutional  protocols and algorithms that pertain to the evaluation of patients at risk for COVID-19 are in a state of rapid change based on information released by regulatory bodies including the CDC and federal and state organizations. These policies and algorithms were followed during the patient's care in the ED.    Most Likely DDx:  -Consider ACS versus PE given prior history of splenic infarct      DDx that was also considered d/t potential to cause harm, but was found less likely based on history and physical (as detailed above): -PNA (no fevers, cough but CXR to evaluate) -PNX (reassured with equal b/l breath sounds, CXR to evaluate) -Symptomatic anemia (will get H&H) -Pulmonary embolism as no sob at rest, not pleuritic in nature, no hypoxia -Aortic Dissection as no tearing pain and no radiation to the mid back, pulses equal -Pericarditis no rub on exam, EKG changes or hx to suggest dx -Tamponade (no notable SOB, tachycardic, hypotensive) -Esophageal rupture (no h/o diffuse vomitting/no crepitus)   Troponin is elevating from 26-207.  Waiting on CT scan.  I did call radiology to help prioritize her.  She does not want to wait for her pregnancy test given she just urinated and states that she is not sexually active  7:58 PM CT PE is negative.  Patient stated the pain got better after the morphine but now is getting worse.  We will give a dose of Dilaudid.  Will discuss with cardiology Dr. Caryl Comes- recommend repeat trop and continued pain control.   9:25  PM repeat troponin up to 1560.  Patient was given nitro and her pain is gone to a 3 out of 10.  Will start on a nitro drip.   Repeat EKGs have been without any changes  9:30 PM given rising troponin with continued chest pain Dr. Caryl Comes recommended that I talk with the STEMI doctor Dr. Saralyn Pilar, who at this time given EKGs are non ischemic recommends medication management.  Will d/w hospital for admission.          ____________________________________________   FINAL CLINICAL IMPRESSION(S) / ED DIAGNOSES   Final diagnoses:  NSTEMI (non-ST elevated myocardial infarction) ([place])     MEDICATIONS GIVEN DURING THIS VISIT:  Medications  nitroGLYCERIN (NITROSTAT) SL tablet 0.4 mg (0.4 mg Sublingual Given 06/11/21 2104)  heparin ADULT infusion 100 units/mL (25000 units/285mL) (1,400 Units/hr Intravenous New Bag/Given 06/11/21 2047)  nitroGLYCERIN 50 mg in dextrose 5 % 250 mL (0.2 mg/mL) infusion (10 mcg/min Intravenous Rate/Dose Change 06/11/21 2219)  acetaminophen (TYLENOL) tablet 650 mg (has no administration in time range)  acetaminophen (TYLENOL) tablet 650 mg (has no administration in time range)  ondansetron (ZOFRAN) injection 4 mg (has no administration in time range)  lactated ringers infusion (has no administration in time range)  metoprolol tartrate (LOPRESSOR) tablet 25 mg (has no administration in time range)  aspirin EC tablet 81 mg (has no administration in time range)  rosuvastatin (CRESTOR) tablet 20 mg (has no administration in time range)  morphine 4 MG/ML injection 4 mg (4 mg Intravenous Given 06/11/21 1809)  ondansetron (ZOFRAN) injection 4 mg (4 mg Intravenous Given 06/11/21 1807)  iohexol (OMNIPAQUE) 350 MG/ML injection 100 mL (100 mLs Intravenous Contrast Given 06/11/21 1926)  HYDROmorphone (DILAUDID) injection 0.5 mg (0.5 mg Intravenous Given 06/11/21 2006)  aspirin chewable tablet 324 mg (324 mg Oral Given 06/11/21 2006)  heparin bolus via infusion 4,000 Units (4,000  Units Intravenous Bolus from Bag 06/11/21 2050)     ED Discharge Orders  None        Note:  This document was prepared using Dragon voice recognition software and may include unintentional dictation errors.    Vanessa [place], MD 06/11/21 2224    Vanessa [place], MD 06/11/21 [phone]

## 2021-06-11 NOTE — Consult Note (Signed)
[place] for IV Heparin Indication: chest pain/ACS  Patient Measurements: Heparin Dosing Weight: 102.5 kg  Labs: Recent Labs    06/11/21 1437 06/11/21 1440 06/11/21 1636 06/11/21 1908  HGB  --  14.1  --   --   HCT  --  42.6  --   --   PLT  --  292  --   --   APTT  --   --   --  25  LABPROT 13.5  --   --   --   INR 1.0  --   --   --   CREATININE  --  0.90  --   --   TROPONINIHS  --  26* 207*  --     Estimated Creatinine Clearance: 117.4 mL/min (by C-G formula based on SCr of 0.9 mg/dL).   Medical History: Past Medical History:  Diagnosis Date   Berger's disease 2011   Buerger's disease ([place])    Family history of adverse reaction to anesthesia    sister and daughter difficult to wake up   Folic acid deficiency    GERD (gastroesophageal reflux disease)    Hypertension    Hypothyroidism    IDA (iron deficiency anemia)    Osteoarthritis    Thrombocytosis     Medications:  No anticoagulation prior to admission per my chart review. Medication reconciliation is pending  Assessment: Patient is a 55 y/o F with medical history as above who presented to the ED 6/30 with mid-sternal chest pain that radiates to jaw. Troponin 26 >> 207. Pharmacy has been consulted to initiate heparin infusion for ACS.  Baseline CBC, aPTT, and PT-INR within normal limits.  Goal of Therapy:  Heparin level 0.3-0.7 units/ml Monitor platelets by anticoagulation protocol: Yes   Plan:  --Heparin 4000 unit IV bolus followed by continuous infusion at 1400 units/hr --Check heparin level 6 hours after initiation of infusion --Daily CBC per protocol while on IV heparin  Benita Gutter 06/11/2021,8:11 PM

## 2021-06-11 NOTE — ED Notes (Signed)
Husband at bedside, assisted pt with ambulating to restroom at this time.

## 2021-06-11 NOTE — ED Triage Notes (Signed)
Pt reports that she developed chest pain, an hour and a half before coming. It is mid sternal, that radiates to her jaw. She feels like she can not take a deep breath. She denies N/V or diaphoreses.

## 2021-06-11 NOTE — H&P (Signed)
History and Physical   TRACEY HERMANCE XIH:[id number] DOB: 10-11-1966 DOA: 06/11/2021  Referring MD/NP/PA: Dr. Jari Pigg  PCP: Lavera Guise, MD   Outpatient Specialists: None  Patient coming from: Home  Chief Complaint: Chest pain  HPI: [name] is a 55 y.o. female with medical history significant of Buerger's disease, morbid obesity, GERD, SIBO, essential hypertension, hypothyroidism, iron deficiency anemia, who presents with chest pain that is centrally located radiating to her jaw.  Symptoms started around noon today.  This has persisted and not remitting.  Has some diaphoresis.  Denied any nausea or vomiting.  Has not had any prior cardiac history.  Patient had COVID-19 infection at least 3 times in the last 2 years.  Also has been vaccinated.  She states she started experiencing the symptoms when she last got vaccinated including getting splenic infarct.  Following the splenic infarct in December of last year she was on anticoagulation for about 3 months which has been discontinued.  She did also have some lower extremity clot at the time.  In the ER patient was evaluated.  EKG showed no evidence of ST elevation but her enzymes are markedly elevated.  She still has ongoing chest pain.  ER physician has discussed with cardiology on-call as well as STEMI physician on-call both of which recommend treating patient as non-ST elevation MI with medical management..  ED Course: Temperature is 99 blood pressure 144/73, pulse 108 respirate of 22 and oxygen sat 98% on room air.  White count is 11.8.  The rest of the CBC and chemistry within normal.  Initial troponin was 26 then 207 then 1560 then 7124.  Chest x-ray showed borderline cardiomegaly with congestion.  CT angiogram of the chest showed no PE.  Patient is being admitted with non-ST elevation MI.  Review of Systems: As per HPI otherwise 10 point review of systems negative.    Past Medical History:  Diagnosis Date   Berger's disease 2011    Buerger's disease ([place])    Family history of adverse reaction to anesthesia    sister and daughter difficult to wake up   Folic acid deficiency    GERD (gastroesophageal reflux disease)    Hypertension    Hypothyroidism    IDA (iron deficiency anemia)    Osteoarthritis    Thrombocytosis     Past Surgical History:  Procedure Laterality Date   ADENOIDECTOMY     Small child   APPENDECTOMY     Age 82   COLON SURGERY  1980   ileostomy and internal pouch age 63   [place] N/A 05/02/2015   Procedure: [place];  Surgeon: Malachy Mood, MD;  Location: [facility] ORS;  Service: Gynecology;  Laterality: N/A;   LAPAROSCOPIC GASTRIC SLEEVE RESECTION  07/06/2016     reports that she quit smoking about 11 years ago. Her smoking use included cigarettes. She has never used smokeless tobacco. She reports that she does not drink alcohol and does not use drugs.  Allergies  Allergen Reactions   Yeast-Related Products    Tape Rash    Some tapes cause a rash, possible the paper tape.    Family History  Problem Relation Age of Onset   Hypertension Mother    Congestive Heart Failure Father    Hypertension Father    Stroke Sister    Skin cancer Maternal Aunt        Basal cell   Skin cancer Maternal Uncle  Basal cell   Congestive Heart Failure Maternal Grandmother    Colon cancer Maternal Grandfather 85   Skin cancer Cousin        Basal Cell     Prior to Admission medications   Medication Sig Start Date End Date Taking? Authorizing Provider  amLODipine (NORVASC) 10 MG tablet TAKE ONE TABLET BY MOUTH EVERY NIGHT AT BEDTIME 01/10/21  Yes Lavera Guise, MD  cilostazol (PLETAL) 50 MG tablet Take 50 mg by mouth 2 (two) times daily. 08/07/20 08/07/21 Yes [provider]  clotrimazole-betamethasone (LOTRISONE) cream Apply 1 application topically 2 (two) times daily. 06/03/21  Yes Rod Can, CNM   Cranberry 1000 MG CAPS Take 1 tablet by mouth daily.   Yes [provider]  [facility] ASPIRIN LOW DOSE 81 MG EC tablet Take 2 tablets (162 mg total) by mouth daily. Patient taking differently: Take 81 mg by mouth in the morning and at bedtime. 02/25/21  Yes Lavera Guise, MD  famotidine (PEPCID) 20 MG tablet TAKE ONE TABLET BY MOUTH TWICE A DAY 11/24/20  Yes Lavera Guise, MD  folic acid (FOLVITE) 1 MG tablet Take 1 tablet (1 mg total) by mouth daily. 05/14/21  Yes Lavera Guise, MD  levothyroxine (SYNTHROID) 150 MCG tablet Take 1 tablet (150 mcg total) by mouth daily before breakfast. 04/29/21  Yes Lavera Guise, MD  pantoprazole (PROTONIX) 40 MG tablet Take 1 tablet (40 mg total) by mouth 2 (two) times daily. 07/28/20  Yes Lavera Guise, MD  Pediatric Multiple Vit-C-FA (FLINSTONES GUMMIES OMEGA-3 DHA) CHEW Chew 2 tablets by mouth daily.   Yes [provider]  Probiotic Product (PROBIOTIC ADVANCED PO) Take 1 capsule by mouth daily.   Yes [provider]  Vitamin D, Ergocalciferol, (DRISDOL) 1.25 MG (50000 UNIT) CAPS capsule TAKE 1 CAPSULE ONCE A WEEK Patient taking differently: Take 50,000 Units by mouth every Tuesday. TAKE 1 CAPSULE ONCE A WEEK 05/14/21  Yes Lavera Guise, MD  levonorgestrel [facility]) 20 MCG/24HR IUD by Intrauterine route.    [provider]    Physical Exam: Vitals:   06/11/21 1657 06/11/21 1753 06/11/21 2051 06/11/21 2130  BP: (!) 145/73 137/87 130/68 117/66  Pulse: 91 95 81 85  Resp: 20 (!) 21 20 18   Temp:      TempSrc:      SpO2: 99% 98% 99% 99%  Weight:      Height:          Constitutional: Morbidly obese, stable, pleasant, no distress Vitals:   06/11/21 1657 06/11/21 1753 06/11/21 2051 06/11/21 2130  BP: (!) 145/73 137/87 130/68 117/66  Pulse: 91 95 81 85  Resp: 20 (!) 21 20 18   Temp:      TempSrc:      SpO2: 99% 98% 99% 99%  Weight:      Height:       Eyes: PERRL, lids and conjunctivae normal ENMT: Mucous membranes are moist.  Posterior pharynx clear of any exudate or lesions.Normal dentition.  Neck: normal, supple, no masses, no thyromegaly Respiratory: Decreased air entry at the bases bilaterally, no wheezing, no crackles. Normal respiratory effort. No accessory muscle use.  Cardiovascular: Regular rate and rhythm, no murmurs / rubs / gallops.  Trace bilateral extremity edema. 2+ pedal pulses. No carotid bruits.  Abdomen: no tenderness, no masses palpated. No hepatosplenomegaly. Bowel sounds positive.  Musculoskeletal: no clubbing / cyanosis. No joint deformity upper and lower extremities. Good ROM, no contractures. Normal muscle tone.  Skin: no rashes, lesions, ulcers. No induration Neurologic: CN 2-12 grossly intact. Sensation intact, DTR normal. Strength 5/5 in all 4.  Psychiatric: Normal judgment and insight. Alert and oriented x 3.  Anxious mood.     Labs on Admission: I have personally reviewed following labs and imaging studies  CBC: Recent Labs  Lab 06/11/21 1440  WBC 11.8*  HGB 14.1  HCT 42.6  MCV 94.2  PLT 485   Basic Metabolic Panel: Recent Labs  Lab 06/11/21 1440  NA 139  K 3.6  CL 106  CO2 22  GLUCOSE 119*  BUN 15  CREATININE 0.90  CALCIUM 9.0   GFR: Estimated Creatinine Clearance: 117.4 mL/min (by C-G formula based on SCr of 0.9 mg/dL). Liver Function Tests: No results for input(s): AST, ALT, ALKPHOS, BILITOT, PROT, ALBUMIN in the last 168 hours. No results for input(s): LIPASE, AMYLASE in the last 168 hours. No results for input(s): AMMONIA in the last 168 hours. Coagulation Profile: Recent Labs  Lab 06/11/21 1437  INR 1.0   Cardiac Enzymes: No results for input(s): CKTOTAL, CKMB, CKMBINDEX, TROPONINI in the last 168 hours. BNP (last 3 results) No results for input(s): PROBNP in the last 8760 hours. HbA1C: No results for input(s): HGBA1C in the last 72 hours. CBG: No results for input(s): GLUCAP in the last 168 hours. Lipid Profile: No results for input(s): CHOL,  HDL, LDLCALC, TRIG, CHOLHDL, LDLDIRECT in the last 72 hours. Thyroid Function Tests: No results for input(s): TSH, T4TOTAL, FREET4, T3FREE, THYROIDAB in the last 72 hours. Anemia Panel: No results for input(s): VITAMINB12, FOLATE, FERRITIN, TIBC, IRON, RETICCTPCT in the last 72 hours. Urine analysis:    Component Value Date/Time   COLORURINE YELLOW (A) 10/16/2019 1302   APPEARANCEUR Clear 08/11/2020 1620   LABSPEC 1.013 10/16/2019 1302   PHURINE 5.0 10/16/2019 1302   GLUCOSEU Negative 08/11/2020 1620   HGBUR NEGATIVE 10/16/2019 1302   BILIRUBINUR neg 06/03/2021 1540   BILIRUBINUR Negative 08/11/2020 1620   KETONESUR NEGATIVE 10/16/2019 1302   PROTEINUR Negative 06/03/2021 1540   PROTEINUR Negative 08/11/2020 1620   PROTEINUR NEGATIVE 10/16/2019 1302   UROBILINOGEN 0.2 06/03/2021 1540   NITRITE neg 06/03/2021 1540   NITRITE Negative 08/11/2020 1620   NITRITE NEGATIVE 10/16/2019 1302   LEUKOCYTESUR Small (1+) (A) 06/03/2021 1540   LEUKOCYTESUR Trace (A) 08/11/2020 1620   LEUKOCYTESUR SMALL (A) 10/16/2019 1302   Sepsis Labs: @LABRCNTIP (procalcitonin:4,lacticidven:4) ) Recent Results (from the past 240 hour(s))  Urine Culture     Status: None   Collection Time: 06/03/21  2:45 PM   Specimen: Urine   UR  Result Value Ref Range Status   Urine Culture, Routine Final report  Final   Organism ID, Bacteria No growth  Final  Resp Panel by RT-PCR (Flu A&B, Covid) Nasopharyngeal Swab     Status: None   Collection Time: 06/11/21  8:00 PM   Specimen: Nasopharyngeal Swab; Nasopharyngeal(NP) swabs in vial transport medium  Result Value Ref Range Status   SARS Coronavirus 2 by RT PCR NEGATIVE NEGATIVE Final    Comment: (NOTE) SARS-CoV-2 target nucleic acids are NOT DETECTED.  The SARS-CoV-2 RNA is generally detectable in upper respiratory specimens during the acute phase of infection. The lowest concentration of SARS-CoV-2 viral copies this assay can detect is 138 copies/mL. A negative  result does not preclude SARS-Cov-2 infection and should not be used as the sole basis for treatment or other patient management decisions. A negative result may occur with  improper specimen collection/handling, submission  of specimen other than nasopharyngeal swab, presence of viral mutation(s) within the areas targeted by this assay, and inadequate number of viral copies(<138 copies/mL). A negative result must be combined with clinical observations, patient history, and epidemiological information. The expected result is Negative.  Fact Sheet for Patients:  EntrepreneurPulse.com.au  Fact Sheet for Healthcare Providers:  IncredibleEmployment.be  This test is no t yet approved or cleared by the [place] FDA and  has been authorized for detection and/or diagnosis of SARS-CoV-2 by FDA under an Emergency Use Authorization (EUA). This EUA will remain  in effect (meaning this test can be used) for the duration of the COVID-19 declaration under Section 564(b)(1) of the Act, 21 U.S.C.section 360bbb-3(b)(1), unless the authorization is terminated  or revoked sooner.       Influenza A by PCR NEGATIVE NEGATIVE Final   Influenza B by PCR NEGATIVE NEGATIVE Final    Comment: (NOTE) The Xpert Xpress SARS-CoV-2/FLU/RSV plus assay is intended as an aid in the diagnosis of influenza from Nasopharyngeal swab specimens and should not be used as a sole basis for treatment. Nasal washings and aspirates are unacceptable for Xpert Xpress SARS-CoV-2/FLU/RSV testing.  Fact Sheet for Patients: EntrepreneurPulse.com.au  Fact Sheet for Healthcare Providers: IncredibleEmployment.be  This test is not yet approved or cleared by the [place] FDA and has been authorized for detection and/or diagnosis of SARS-CoV-2 by FDA under an Emergency Use Authorization (EUA). This EUA will remain in effect (meaning this test can be used)  for the duration of the COVID-19 declaration under Section 564(b)(1) of the Act, 21 U.S.C. section 360bbb-3(b)(1), unless the authorization is terminated or revoked.  Performed at [facility], [address] [place], [place] [address]      Radiological Exams on Admission: DG Chest 2 View  Result Date: 06/11/2021 CLINICAL DATA:  Chest pain EXAM: CHEST - 2 VIEW COMPARISON:  04/03/2010 FINDINGS: No focal opacity or pleural effusion. Borderline cardiomegaly with slight central congestion. No pneumothorax. IMPRESSION: Borderline cardiomegaly with slight central congestion Electronically Signed   By: Donavan Foil M.D.   On: 06/11/2021 15:30   CT Angio Chest PE W and/or Wo Contrast  Result Date: 06/11/2021 CLINICAL DATA:  Chest pain EXAM: CT ANGIOGRAPHY CHEST WITH CONTRAST TECHNIQUE: Multidetector CT imaging of the chest was performed using the standard protocol during bolus administration of intravenous contrast. Multiplanar CT image reconstructions and MIPs were obtained to evaluate the vascular anatomy. CONTRAST:  141mL OMNIPAQUE IOHEXOL 350 MG/ML SOLN COMPARISON:  09/10/2020 FINDINGS: Cardiovascular: No filling defects in the pulmonary arteries to suggest pulmonary emboli. Heart is borderline in size. Aorta normal caliber. Mediastinum/Nodes: No mediastinal, hilar, or axillary adenopathy. Trachea and esophagus are unremarkable. Thyroid unremarkable. Lungs/Pleura: Lungs are clear. No focal airspace opacities or suspicious nodules. No effusions. Upper Abdomen: Imaging into the upper abdomen demonstrates no acute findings. Musculoskeletal: Chest wall soft tissues are unremarkable. No acute bony abnormality. Review of the MIP images confirms the above findings. IMPRESSION: No evidence of pulmonary embolus. Borderline heart size. No acute cardiopulmonary disease. Electronically Signed   By: Rolm Baptise M.D.   On: 06/11/2021 19:37    EKG: Independently reviewed.  It showed normal sinus rhythm.   No significant ST-T wave changes  Assessment/Plan Principal Problem:   NSTEMI (non-ST elevated myocardial infarction) [facility]) Active Problems:   Morbid obesity with BMI of 50.0-59.9, adult (HCC)   Hypertension   Buerger's disease ([place])   Decreased functional mobility   Peripheral vascular disease ([place])   Obstructive sleep  apnea   Hypothyroidism due to Hashimoto's thyroiditis   Gastroesophageal reflux disease     #1 non-ST elevation MI: Patient is still having ongoing chest pain.  We will admit the patient.  Initiate IV heparin, IV nitroglycerin, as well as beta-blocker, aspirin and statin.  Cardiology consulted.  Patient more than likely will need a cardiac cath.  #2 morbid obesity: Counseling provided.  Patient has been on keto diet trying to lose weight.  #3 peripheral vascular disease: Patient on Pletal.  Continue.  Continue with statin.  #4 Buerger's disease: Supportive care.  Continue  #5 essential hypertension: Patient on amlodipine and metoprolol is being added.  #6 hypothyroidism: On levothyroxine 115 mcg daily.  Continue  #7 GERD: Continue Protonix.  #8 obstructive sleep apnea: Upper CPAP at night.   DVT prophylaxis: Heparin drip Code Status: Full code Family Communication: Daughter at bedside Disposition Plan: To be determined Consults called: Dr. Saralyn Pilar, cardiology Admission status: Inpatient  Severity of Illness: The appropriate patient status for this patient is INPATIENT. Inpatient status is judged to be reasonable and necessary in order to provide the required intensity of service to ensure the patient's safety. The patient's presenting symptoms, physical exam findings, and initial radiographic and laboratory data in the context of their chronic comorbidities is felt to place them at high risk for further clinical deterioration. Furthermore, it is not anticipated that the patient will be medically stable for discharge from the hospital within 2 midnights of  admission. The following factors support the patient status of inpatient.   " The patient's presenting symptoms include chest pain. " The worrisome physical exam findings include morbidly obese with some tachycardia. " The initial radiographic and laboratory data are worrisome because of rising troponin with normal EKG. " The chronic co-morbidities include hypertension and peripheral vascular disease.   * I certify that at the point of admission it is my clinical judgment that the patient will require inpatient hospital care spanning beyond 2 midnights from the point of admission due to high intensity of service, high risk for further deterioration and high frequency of surveillance required.Barbette Merino MD [facility] Pager [phone]  If 7PM-7AM, please contact night-coverage [email or web address] Password [facility]  06/11/2021, 10:23 PM

## 2021-06-12 ENCOUNTER — Inpatient Hospital Stay
Admit: 2021-06-12 | Discharge: 2021-06-12 | Disposition: A | Discharge status: Home / Self Care | Payer: 59 | Attending: Cardiology | Admitting: Cardiology

## 2021-06-12 ENCOUNTER — Other Ambulatory Visit: Payer: Self-pay

## 2021-06-12 ENCOUNTER — Encounter: Payer: Self-pay | Admitting: Cardiology

## 2021-06-12 ENCOUNTER — Telehealth: Payer: Self-pay

## 2021-06-12 ENCOUNTER — Encounter
Admission: EM | Disposition: A | Discharge status: Home / Self Care | Payer: Self-pay | Source: Home / Self Care | Attending: Internal Medicine

## 2021-06-12 DIAGNOSIS — I214 Non-ST elevation (NSTEMI) myocardial infarction: Secondary | ICD-10-CM

## 2021-06-12 HISTORY — PX: CORONARY/GRAFT ACUTE MI REVASCULARIZATION: CATH118305

## 2021-06-12 HISTORY — PX: LEFT HEART CATH AND CORONARY ANGIOGRAPHY: CATH118249

## 2021-06-12 LAB — CBC
HCT: 40.2 % (ref 36.0–46.0)
Hemoglobin: 13.7 g/dL (ref 12.0–15.0)
MCH: 31.1 pg (ref 26.0–34.0)
MCHC: 34.1 g/dL (ref 30.0–36.0)
MCV: 91.2 fL (ref 80.0–100.0)
Platelets: 308 10*3/uL (ref 150–400)
RBC: 4.41 MIL/uL (ref 3.87–5.11)
RDW: 14.5 % (ref 11.5–15.5)
WBC: 12.7 10*3/uL — ABNORMAL HIGH (ref 4.0–10.5)
nRBC: 0 % (ref 0.0–0.2)

## 2021-06-12 LAB — ECHOCARDIOGRAM COMPLETE
AR max vel: 2.32 cm2
AV Area VTI: 2.13 cm2
AV Area mean vel: 2.45 cm2
AV Mean grad: 3 mmHg
AV Peak grad: 6.6 mmHg
Ao pk vel: 1.28 m/s
Area-P 1/2: 4.19 cm2
Height: 67 in
S' Lateral: 2.63 cm
Weight: 5643.78 oz

## 2021-06-12 LAB — GLUCOSE, CAPILLARY: Glucose-Capillary: 113 mg/dL — ABNORMAL HIGH (ref 70–99)

## 2021-06-12 LAB — TROPONIN I (HIGH SENSITIVITY)
Troponin I (High Sensitivity): 11653 ng/L (ref <18)
Troponin I (High Sensitivity): 9666 ng/L (ref <18)

## 2021-06-12 LAB — HEPATIC FUNCTION PANEL
ALT: 27 U/L (ref 0–44)
AST: 65 U/L — ABNORMAL HIGH (ref 15–41)
Albumin: 3.4 g/dL — ABNORMAL LOW (ref 3.5–5.0)
Alkaline Phosphatase: 90 U/L (ref 38–126)
Bilirubin, Direct: 0.1 mg/dL (ref 0.0–0.2)
Indirect Bilirubin: 0.9 mg/dL (ref 0.3–0.9)
Total Bilirubin: 1 mg/dL (ref 0.3–1.2)
Total Protein: 7.1 g/dL (ref 6.5–8.1)

## 2021-06-12 LAB — MRSA NEXT GEN BY PCR, NASAL: MRSA by PCR Next Gen: NOT DETECTED

## 2021-06-12 LAB — HIV ANTIBODY (ROUTINE TESTING W REFLEX): HIV Screen 4th Generation wRfx: NONREACTIVE

## 2021-06-12 LAB — SEDIMENTATION RATE: Sed Rate: 25 mm/hr (ref 0–30)

## 2021-06-12 SURGERY — CORONARY/GRAFT ACUTE MI REVASCULARIZATION
Anesthesia: Moderate Sedation

## 2021-06-12 MED ORDER — SODIUM CHLORIDE 0.9 % IV SOLN: 250.0000 mL | INTRAVENOUS | Status: DC | PRN

## 2021-06-12 MED ORDER — CLOPIDOGREL BISULFATE 75 MG PO TABS
ORAL_TABLET | ORAL | Status: DC | PRN
  Administered 2021-06-12: 75 mg via ORAL

## 2021-06-12 MED ORDER — FENTANYL CITRATE (PF) 100 MCG/2ML IJ SOLN
INTRAMUSCULAR | Status: AC
  Filled 2021-06-12: qty 2

## 2021-06-12 MED ORDER — CLOPIDOGREL BISULFATE 75 MG PO TABS
75.0000 mg | ORAL_TABLET | Freq: Every day | ORAL | Status: DC
  Filled 2021-06-12: qty 1

## 2021-06-12 MED ORDER — IOHEXOL 300 MG/ML  SOLN
INTRAMUSCULAR | Status: DC | PRN
  Administered 2021-06-12: 88 mL

## 2021-06-12 MED ORDER — MIDAZOLAM HCL 2 MG/2ML IJ SOLN
INTRAMUSCULAR | Status: DC | PRN
  Administered 2021-06-12: 2 mg via INTRAVENOUS

## 2021-06-12 MED ORDER — HYDRALAZINE HCL 20 MG/ML IJ SOLN: 10.0000 mg | INTRAMUSCULAR | Status: AC | PRN

## 2021-06-12 MED ORDER — LIDOCAINE HCL (PF) 1 % IJ SOLN
INTRAMUSCULAR | Status: DC | PRN
  Administered 2021-06-12: 2 mL

## 2021-06-12 MED ORDER — ONDANSETRON HCL 4 MG/2ML IJ SOLN: 4.0000 mg | Freq: Four times a day (QID) | INTRAMUSCULAR | Status: DC | PRN

## 2021-06-12 MED ORDER — HEPARIN (PORCINE) IN NACL 1000-0.9 UT/500ML-% IV SOLN
INTRAVENOUS | Status: AC
  Filled 2021-06-12: qty 1000

## 2021-06-12 MED ORDER — ASPIRIN EC 325 MG PO TBEC
325.0000 mg | DELAYED_RELEASE_TABLET | Freq: Every day | ORAL | Status: DC
  Administered 2021-06-12 – 2021-06-13 (×2): 325 mg via ORAL
  Filled 2021-06-12 (×2): qty 1

## 2021-06-12 MED ORDER — VERAPAMIL HCL 2.5 MG/ML IV SOLN
INTRAVENOUS | Status: AC
  Filled 2021-06-12: qty 2

## 2021-06-12 MED ORDER — SODIUM CHLORIDE 0.9% FLUSH: 3.0000 mL | INTRAVENOUS | Status: DC | PRN

## 2021-06-12 MED ORDER — CLOPIDOGREL BISULFATE 75 MG PO TABS
ORAL_TABLET | ORAL | Status: AC
  Filled 2021-06-12: qty 1

## 2021-06-12 MED ORDER — NITROGLYCERIN 2 % TD OINT
1.0000 [in_us] | TOPICAL_OINTMENT | Freq: Four times a day (QID) | TRANSDERMAL | Status: DC
  Administered 2021-06-12: 1 [in_us] via TOPICAL
  Filled 2021-06-12: qty 1

## 2021-06-12 MED ORDER — HEPARIN SODIUM (PORCINE) 1000 UNIT/ML IJ SOLN
INTRAMUSCULAR | Status: DC | PRN
  Administered 2021-06-12: 6000 [IU] via INTRAVENOUS

## 2021-06-12 MED ORDER — ORAL CARE MOUTH RINSE
15.0000 mL | Freq: Two times a day (BID) | OROMUCOSAL | Status: DC
  Administered 2021-06-13: 15 mL via OROMUCOSAL

## 2021-06-12 MED ORDER — CRANBERRY 1000 MG PO CAPS: 1000.0000 mg | ORAL_CAPSULE | Freq: Every day | ORAL | Status: DC

## 2021-06-12 MED ORDER — SODIUM CHLORIDE 0.9% FLUSH
3.0000 mL | Freq: Two times a day (BID) | INTRAVENOUS | Status: DC
  Administered 2021-06-12 – 2021-06-13 (×3): 3 mL via INTRAVENOUS

## 2021-06-12 MED ORDER — MIDAZOLAM HCL 2 MG/2ML IJ SOLN
INTRAMUSCULAR | Status: AC
  Filled 2021-06-12: qty 2

## 2021-06-12 MED ORDER — FOLIC ACID 1 MG PO TABS
1.0000 mg | ORAL_TABLET | Freq: Every day | ORAL | Status: DC
  Administered 2021-06-12 – 2021-06-13 (×2): 1 mg via ORAL
  Filled 2021-06-12 (×2): qty 1

## 2021-06-12 MED ORDER — CHLORHEXIDINE GLUCONATE CLOTH 2 % EX PADS
6.0000 | MEDICATED_PAD | Freq: Every day | CUTANEOUS | Status: DC
  Administered 2021-06-12 – 2021-06-13 (×3): 6 via TOPICAL

## 2021-06-12 MED ORDER — COLCHICINE 0.6 MG PO TABS
0.6000 mg | ORAL_TABLET | Freq: Two times a day (BID) | ORAL | Status: DC
  Administered 2021-06-12 – 2021-06-13 (×3): 0.6 mg via ORAL
  Filled 2021-06-12 (×4): qty 1

## 2021-06-12 MED ORDER — RISAQUAD PO CAPS
1.0000 | ORAL_CAPSULE | Freq: Every day | ORAL | Status: DC
  Administered 2021-06-12 – 2021-06-13 (×2): 1 via ORAL
  Filled 2021-06-12 (×3): qty 1

## 2021-06-12 MED ORDER — SODIUM CHLORIDE 0.9 % WEIGHT BASED INFUSION
1.0000 mL/kg/h | INTRAVENOUS | Status: DC
  Administered 2021-06-12: 1 mL/kg/h via INTRAVENOUS

## 2021-06-12 MED ORDER — ACETAMINOPHEN 325 MG PO TABS
650.0000 mg | ORAL_TABLET | ORAL | Status: DC | PRN
  Administered 2021-06-13: 650 mg via ORAL
  Filled 2021-06-12 (×2): qty 2

## 2021-06-12 MED ORDER — HEPARIN SODIUM (PORCINE) 1000 UNIT/ML IJ SOLN
INTRAMUSCULAR | Status: AC
  Filled 2021-06-12: qty 1

## 2021-06-12 MED ORDER — LIDOCAINE HCL (PF) 1 % IJ SOLN
INTRAMUSCULAR | Status: AC
  Filled 2021-06-12: qty 30

## 2021-06-12 MED ORDER — VERAPAMIL HCL 2.5 MG/ML IV SOLN
INTRAVENOUS | Status: DC | PRN
  Administered 2021-06-12: 2.5 mg via INTRAVENOUS

## 2021-06-12 MED ORDER — HEPARIN (PORCINE) IN NACL 2000-0.9 UNIT/L-% IV SOLN
INTRAVENOUS | Status: DC | PRN
  Administered 2021-06-12: 1000 mL

## 2021-06-12 MED ORDER — FENTANYL CITRATE (PF) 100 MCG/2ML IJ SOLN
INTRAMUSCULAR | Status: DC | PRN
  Administered 2021-06-12: 25 ug via INTRAVENOUS

## 2021-06-12 MED ORDER — LABETALOL HCL 5 MG/ML IV SOLN: 10.0000 mg | INTRAVENOUS | Status: DC | PRN

## 2021-06-12 SURGICAL SUPPLY — 14 items
CATH INFINITI 5FR ANG PIGTAIL (CATHETERS) ×3 IMPLANT
CATH INFINITI JR4 5F (CATHETERS) ×3 IMPLANT
CATH VISTA GUIDE 6FR JL3.5 (CATHETERS) ×3 IMPLANT
DEVICE RAD COMP TR BAND LRG (VASCULAR PRODUCTS) ×3 IMPLANT
DRAPE BRACHIAL (DRAPES) ×3 IMPLANT
GLIDESHEATH SLEND SS 6F .021 (SHEATH) ×3 IMPLANT
KIT ENCORE 26 ADVANTAGE (KITS) IMPLANT
KIT SYRINGE INJ CVI SPIKEX1 (MISCELLANEOUS) ×3 IMPLANT
PACK CARDIAC CATH (CUSTOM PROCEDURE TRAY) ×3 IMPLANT
PROTECTION STATION PRESSURIZED (MISCELLANEOUS) ×3
SET ATX SIMPLICITY (MISCELLANEOUS) ×3 IMPLANT
STATION PROTECTION PRESSURIZED (MISCELLANEOUS) ×1 IMPLANT
TUBING CIL FLEX 10 FLL-RA (TUBING) ×3 IMPLANT
WIRE ROSEN-J .035X260CM (WIRE) ×3 IMPLANT

## 2021-06-12 NOTE — Consult Note (Signed)
[facility]  CARDIOLOGY CONSULT NOTE  Patient ID: [name] MRN: [id number] DOB/AGE: Dec 17, 1965 55 y.o.  Admit date: 06/11/2021 Referring Physician Jonelle Sidle Primary Physician [facility] Cardiologist  Reason for Consultation non-ST elevation myocardial infarction  HPI: 55 year old female referred for non-ST elevation myocardial infarction with ongoing chest pain.  Patient has a history of Buerger's disease.  He was in usual state of health until 06/11/2021 and she experienced substernal chest and jaw pain.  She presented to [facility] emergency room where ECG is nondiagnostic.  The patient has ruled in for non-ST elevation myocardial infarction with elevated troponin 26, 207, 1560, 7124, and 11,653.  The patient was treated with nitroglycerin infusion and heparin drip but has continued to experience substernal chest pain and jaw pain.  Review of systems complete and found to be negative unless listed above     Past Medical History:  Diagnosis Date   Berger's disease 2011   Buerger's disease ([place])    Family history of adverse reaction to anesthesia    sister and daughter difficult to wake up   Folic acid deficiency    GERD (gastroesophageal reflux disease)    Hypertension    Hypothyroidism    IDA (iron deficiency anemia)    Osteoarthritis    Thrombocytosis     Past Surgical History:  Procedure Laterality Date   ADENOIDECTOMY     Small child   APPENDECTOMY     Age 48   COLON SURGERY  1980   ileostomy and internal pouch age 55   [place] N/A 05/02/2015   Procedure: [place];  Surgeon: Malachy Mood, MD;  Location: [facility] ORS;  Service: Gynecology;  Laterality: N/A;   LAPAROSCOPIC GASTRIC SLEEVE RESECTION  07/06/2016    (Not in a hospital admission)  Social History   Socioeconomic History   Marital status: Married    Spouse name: Not on file   Number of children: Not on file   Years of  education: Not on file   Highest education level: Not on file  Occupational History   Not on file  Tobacco Use   Smoking status: Former    Pack years: 0.00    Types: Cigarettes    Quit date: 04/28/2010    Years since quitting: 11.1   Smokeless tobacco: Never  Substance and Sexual Activity   Alcohol use: No    Alcohol/week: 0.0 standard drinks   Drug use: No   Sexual activity: Yes    Birth control/protection: None  Other Topics Concern   Not on file  Social History Narrative   Not on file   Social Determinants of Health   Financial Resource Strain: Not on file  Food Insecurity: Not on file  Transportation Needs: Not on file  Physical Activity: Not on file  Stress: Not on file  Social Connections: Not on file  Intimate Partner Violence: Not on file    Family History  Problem Relation Age of Onset   Hypertension Mother    Congestive Heart Failure Father    Hypertension Father    Stroke Sister    Skin cancer Maternal Aunt        Basal cell   Skin cancer Maternal Uncle        Basal cell   Congestive Heart Failure Maternal Grandmother    Colon cancer Maternal Grandfather 85   Skin cancer Cousin        Basal Cell      Review of  systems complete and found to be negative unless listed above      PHYSICAL EXAM  General: Well developed, well nourished, in no acute distress HEENT:  Normocephalic and atramatic Neck:  No JVD.  Lungs: Clear bilaterally to auscultation and percussion. Heart: HRRR . Normal S1 and S2 without gallops or murmurs.  Abdomen: Bowel sounds are positive, abdomen soft and non-tender  Msk:  Back normal, normal gait. Normal strength and tone for age. Extremities: No clubbing, cyanosis or edema.   Neuro: Alert and oriented X 3. Psych:  Good affect, responds appropriately  Labs:   Lab Results  Component Value Date   WBC 11.8 (H) 06/11/2021   HGB 14.1 06/11/2021   HCT 42.6 06/11/2021   MCV 94.2 06/11/2021   PLT 292 06/11/2021    Recent  Labs  Lab 06/11/21 1440  NA 139  K 3.6  CL 106  CO2 22  BUN 15  CREATININE 0.90  CALCIUM 9.0  GLUCOSE 119*   No results found for: CKTOTAL, CKMB, CKMBINDEX, TROPONINI  Lab Results  Component Value Date   CHOL 142 09/03/2020   CHOL 135 10/16/2019   CHOL 149 06/20/2018   Lab Results  Component Value Date   HDL 51 09/03/2020   HDL 39 (L) 10/16/2019   HDL 49 06/20/2018   Lab Results  Component Value Date   LDLCALC 72 09/03/2020   LDLCALC 74 10/16/2019   LDLCALC 84 06/20/2018   Lab Results  Component Value Date   TRIG 97 09/03/2020   TRIG 110 10/16/2019   TRIG 81 06/20/2018   Lab Results  Component Value Date   CHOLHDL 2.8 09/03/2020   CHOLHDL 3.5 10/16/2019   CHOLHDL 3.0 06/20/2018   No results found for: LDLDIRECT    Radiology: DG Chest 2 View  Result Date: 06/11/2021 CLINICAL DATA:  Chest pain EXAM: CHEST - 2 VIEW COMPARISON:  04/03/2010 FINDINGS: No focal opacity or pleural effusion. Borderline cardiomegaly with slight central congestion. No pneumothorax. IMPRESSION: Borderline cardiomegaly with slight central congestion Electronically Signed   By: Donavan Foil M.D.   On: 06/11/2021 15:30   CT Angio Chest PE W and/or Wo Contrast  Result Date: 06/11/2021 CLINICAL DATA:  Chest pain EXAM: CT ANGIOGRAPHY CHEST WITH CONTRAST TECHNIQUE: Multidetector CT imaging of the chest was performed using the standard protocol during bolus administration of intravenous contrast. Multiplanar CT image reconstructions and MIPs were obtained to evaluate the vascular anatomy. CONTRAST:  167mL OMNIPAQUE IOHEXOL 350 MG/ML SOLN COMPARISON:  09/10/2020 FINDINGS: Cardiovascular: No filling defects in the pulmonary arteries to suggest pulmonary emboli. Heart is borderline in size. Aorta normal caliber. Mediastinum/Nodes: No mediastinal, hilar, or axillary adenopathy. Trachea and esophagus are unremarkable. Thyroid unremarkable. Lungs/Pleura: Lungs are clear. No focal airspace opacities or  suspicious nodules. No effusions. Upper Abdomen: Imaging into the upper abdomen demonstrates no acute findings. Musculoskeletal: Chest wall soft tissues are unremarkable. No acute bony abnormality. Review of the MIP images confirms the above findings. IMPRESSION: No evidence of pulmonary embolus. Borderline heart size. No acute cardiopulmonary disease. Electronically Signed   By: Rolm Baptise M.D.   On: 06/11/2021 19:37    EKG: Sinus tachycardia with nonspecific ST abnormality  ASSESSMENT AND PLAN:   1.  non-ST elevation myocardial infarction with persistent chest pain despite nitroglycerin and heparin drip 2.  Buerger's disease, patient quit smoking 10 years ago, Doppler ultrasound 11/05/2020 revealed no significant arterial obstruction in right or left upper extremity  Recommendations  Urgent cardiac catheterization and possible PCI  2.  Continue heparin and nitroglycerin drip  Signed: Isaias Cowman MD,PhD, [facility] 06/12/2021, 2:36 AM

## 2021-06-12 NOTE — ED Notes (Signed)
Dr. Saralyn Pilar at bedside at this time.

## 2021-06-12 NOTE — Progress Notes (Signed)
*  PRELIMINARY RESULTS* Echocardiogram 2D Echocardiogram has been performed.  Sherrie Sport 06/12/2021, 9:52 AM

## 2021-06-12 NOTE — ED Notes (Signed)
Pt belongings placed in bag and given to daughter at bedside.

## 2021-06-12 NOTE — ED Notes (Signed)
Provider made aware that pt has had troponin trending upward with minimal relief of nitro drip. This RN advised provider that pt should have cardiologist consult. Provider stated that he had talked with cardiology at this time.

## 2021-06-12 NOTE — ED Notes (Addendum)
Provider made aware of troponin increasing from 7,124 at 2233 to 11,653 at [place]. Provider stated, "Cardiologist has been notified." New EKG obtained at this time.

## 2021-06-12 NOTE — Progress Notes (Signed)
Chaplain Maggie responded to code STEMI to ED and met patient and her daughter. Chaplain offered hospitality and accompanied patient's daughter during cath lab procedure. Space was made for storytelling and listening. Faith is a guiding force in the family's life so prayer was an emphasis during visitation. Family expressed gratitude for the care and support of chaplain during this moment.

## 2021-06-12 NOTE — Telephone Encounter (Signed)
Pt called that she admitted she like [place] to inform and also advised her that she can hospital follow up after discharge we notified dr Humphrey Rolls

## 2021-06-12 NOTE — Progress Notes (Signed)
Patient Name: [name] Date of Encounter: 06/12/2021  Hospital Problem List     Principal Problem:   NSTEMI (non-ST elevated myocardial infarction) [facility]) Active Problems:   Morbid obesity with BMI of 50.0-59.9, adult (HCC)   Hypertension   Buerger's disease ([place])   Decreased functional mobility   Peripheral vascular disease ([place])   Obstructive sleep apnea   Hypothyroidism due to Hashimoto's thyroiditis   Gastroesophageal reflux disease    Patient Profile       55 year old female who presented to the hospital with chest pain with an elevated troponin.  Cardiac cath revealed normal coronary arteries.  EF normal with no regional wall motion abnormality by echo.  Troponin is improving.  Chest pain is improving.  She has sleep apnea on BiPAP.  Episode of pain likely was a viral myocarditis.  Does not appear to be Takotsubo's based on EF and symptoms and history.  Subjective   Shortness of breath.  Diffuse pain.  Inpatient Medications     acidophilus  1 capsule Oral Daily   amLODipine  10 mg Oral QHS   aspirin EC  325 mg Oral Daily   Chlorhexidine Gluconate Cloth  6 each Topical Daily   cilostazol  50 mg Oral BID   clopidogrel  75 mg Oral Q breakfast   colchicine  0.6 mg Oral BID   famotidine  20 mg Oral BID   folic acid  1 mg Oral Daily   levothyroxine  150 mcg Oral Q0600   mouth rinse  15 mL Mouth Rinse BID   pantoprazole  40 mg Oral BID   sodium chloride flush  3 mL Intravenous Q12H    Vital Signs    Vitals:   06/12/21 0700 06/12/21 0800 06/12/21 0900 06/12/21 1000  BP: 107/65 (!) 110/58 112/68 (!) 104/47  Pulse: 60 (!) 51 63 66  Resp: 18 13 19 19   Temp:      TempSrc:      SpO2: 98% 95% 96% 94%  Weight:      Height:        Intake/Output Summary (Last 24 hours) at 06/12/2021 1052 Last data filed at 06/12/2021 1002 Gross per 24 hour  Intake 856.36 ml  Output --  Net 856.36 ml   Filed Weights   06/11/21 1438 06/12/21 0400  Weight: (!) 161.9 kg (!) 160  kg    Physical Exam    GEN: Well nourished, well developed, in no acute distress.  HEENT: normal.  Neck: Supple, no JVD, carotid bruits, or masses. Cardiac: RRR, no murmurs, rubs, or gallops. No clubbing, cyanosis, edema.  Radials/DP/PT 2+ and equal bilaterally.  Respiratory:  Respirations regular and unlabored, clear to auscultation bilaterally. GI: Soft, nontender, nondistended, BS + x 4. MS: no deformity or atrophy. Skin: warm and dry, no rash. Neuro:  Strength and sensation are intact. Psych: Normal affect.  Labs    CBC Recent Labs    06/11/21 1440 06/12/21 0412  WBC 11.8* 12.7*  HGB 14.1 13.7  HCT 42.6 40.2  MCV 94.2 91.2  PLT 292 093   Basic Metabolic Panel Recent Labs    06/11/21 1440  NA 139  K 3.6  CL 106  CO2 22  GLUCOSE 119*  BUN 15  CREATININE 0.90  CALCIUM 9.0   Liver Function Tests No results for input(s): AST, ALT, ALKPHOS, BILITOT, PROT, ALBUMIN in the last 72 hours. No results for input(s): LIPASE, AMYLASE in the last 72 hours. Cardiac Enzymes No results for  input(s): CKTOTAL, CKMB, CKMBINDEX, TROPONINI in the last 72 hours. BNP No results for input(s): BNP in the last 72 hours. D-Dimer No results for input(s): DDIMER in the last 72 hours. Hemoglobin A1C No results for input(s): HGBA1C in the last 72 hours. Fasting Lipid Panel No results for input(s): CHOL, HDL, LDLCALC, TRIG, CHOLHDL, LDLDIRECT in the last 72 hours. Thyroid Function Tests No results for input(s): TSH, T4TOTAL, T3FREE, THYROIDAB in the last 72 hours.  Invalid input(s): FREET3  Telemetry    Normal sinus rhythm with no significant arrhythmia  ECG    Normal sinus rhythm with no ischemia  Radiology    DG Chest 2 View  Result Date: 06/11/2021 CLINICAL DATA:  Chest pain EXAM: CHEST - 2 VIEW COMPARISON:  04/03/2010 FINDINGS: No focal opacity or pleural effusion. Borderline cardiomegaly with slight central congestion. No pneumothorax. IMPRESSION: Borderline cardiomegaly  with slight central congestion Electronically Signed   By: Donavan Foil M.D.   On: 06/11/2021 15:30   CT Angio Chest PE W and/or Wo Contrast  Result Date: 06/11/2021 CLINICAL DATA:  Chest pain EXAM: CT ANGIOGRAPHY CHEST WITH CONTRAST TECHNIQUE: Multidetector CT imaging of the chest was performed using the standard protocol during bolus administration of intravenous contrast. Multiplanar CT image reconstructions and MIPs were obtained to evaluate the vascular anatomy. CONTRAST:  151mL OMNIPAQUE IOHEXOL 350 MG/ML SOLN COMPARISON:  09/10/2020 FINDINGS: Cardiovascular: No filling defects in the pulmonary arteries to suggest pulmonary emboli. Heart is borderline in size. Aorta normal caliber. Mediastinum/Nodes: No mediastinal, hilar, or axillary adenopathy. Trachea and esophagus are unremarkable. Thyroid unremarkable. Lungs/Pleura: Lungs are clear. No focal airspace opacities or suspicious nodules. No effusions. Upper Abdomen: Imaging into the upper abdomen demonstrates no acute findings. Musculoskeletal: Chest wall soft tissues are unremarkable. No acute bony abnormality. Review of the MIP images confirms the above findings. IMPRESSION: No evidence of pulmonary embolus. Borderline heart size. No acute cardiopulmonary disease. Electronically Signed   By: Rolm Baptise M.D.   On: 06/11/2021 19:37   CARDIAC CATHETERIZATION  Result Date: 06/12/2021 1.  Normal coronary anatomy 2.  Normal left ventricular function Recommendations 1.  Dual antiplatelet therapy 2.  DC nitroglycerin drip 3.  DC heparin drip 4.  Continue metoprolol titrate 5.  Nitropaste 1 inch every 6 hours 6.  2D echocardiogram  ECHOCARDIOGRAM COMPLETE  Result Date: 06/12/2021    ECHOCARDIOGRAM REPORT   Patient Name:   [name] Date of Exam: 06/12/2021 Medical Rec #:  [id number]     Height:       67.0 in Accession #:    [id number]    Weight:       352.7 lb Date of Birth:  [birth year]     BSA:          2.574 m Patient Age:    55 years      BP:            107/65 mmHg Patient Gender: F             HR:           60 bpm. Exam Location:  [facility] Procedure: 2D Echo, Cardiac Doppler and Color Doppler Indications:     Acute myocardial Infarction -unspecified I21.9  History:         Patient has prior history of Echocardiogram examinations, most                  recent 06/27/2018. Risk Factors:Hypertension.  Sonographer:     Sherrie Sport RDCS (  AE) Referring Phys:  [place] Diagnosing Phys: Bartholome Bill MD  Sonographer Comments: Suboptimal apical window and no subcostal window. IMPRESSIONS  1. Left ventricular ejection fraction, by estimation, is 65 to 70%. The left ventricle has normal function. The left ventricle has no regional wall motion abnormalities. There is mild left ventricular hypertrophy. Left ventricular diastolic parameters were normal.  2. Right ventricular systolic function is normal. The right ventricular size is normal.  3. The mitral valve was not well visualized. Trivial mitral valve regurgitation.  4. The aortic valve is grossly normal. Aortic valve regurgitation is trivial. FINDINGS  Left Ventricle: Left ventricular ejection fraction, by estimation, is 65 to 70%. The left ventricle has normal function. The left ventricle has no regional wall motion abnormalities. The left ventricular internal cavity size was normal in size. There is  mild left ventricular hypertrophy. Left ventricular diastolic parameters were normal. Right Ventricle: The right ventricular size is normal. No increase in right ventricular wall thickness. Right ventricular systolic function is normal. Left Atrium: Left atrial size was normal in size. Right Atrium: Right atrial size was normal in size. Pericardium: There is no evidence of pericardial effusion. Mitral Valve: The mitral valve was not well visualized. Trivial mitral valve regurgitation. Tricuspid Valve: The tricuspid valve is not well visualized. Tricuspid valve regurgitation is mild. Aortic Valve: The aortic  valve is grossly normal. Aortic valve regurgitation is trivial. Aortic valve mean gradient measures 3.0 mmHg. Aortic valve peak gradient measures 6.6 mmHg. Aortic valve area, by VTI measures 2.13 cm. Pulmonic Valve: The pulmonic valve was not well visualized. Pulmonic valve regurgitation is trivial. Aorta: The aortic root is normal in size and structure. IAS/Shunts: The interatrial septum was not well visualized.  LEFT VENTRICLE PLAX 2D LVIDd:         4.80 cm  Diastology LVIDs:         2.63 cm  LV e' medial:    8.05 cm/s LV PW:         1.38 cm  LV E/e' medial:  11.6 LV IVS:        0.92 cm  LV e' lateral:   9.68 cm/s LVOT diam:     2.00 cm  LV E/e' lateral: 9.6 LV SV:         55 LV SV Index:   21 LVOT Area:     3.14 cm  RIGHT VENTRICLE RV Basal diam:  3.42 cm RV S prime:     13.80 cm/s TAPSE (M-mode): 3.2 cm LEFT ATRIUM             Index       RIGHT ATRIUM           Index LA diam:        3.40 cm 1.32 cm/m  RA Area:     17.70 cm LA Vol (A2C):   64.9 ml 25.22 ml/m RA Volume:   45.80 ml  17.80 ml/m LA Vol (A4C):   25.4 ml 9.87 ml/m LA Biplane Vol: 41.2 ml 16.01 ml/m  AORTIC VALVE                   PULMONIC VALVE AV Area (Vmax):    2.32 cm    PV Vmax:        0.76 m/s AV Area (Vmean):   2.45 cm    PV Peak grad:   2.3 mmHg AV Area (VTI):     2.13 cm    RVOT Peak grad: 2 mmHg  AV Vmax:           128.00 cm/s AV Vmean:          83.700 cm/s AV VTI:            0.260 m AV Peak Grad:      6.6 mmHg AV Mean Grad:      3.0 mmHg LVOT Vmax:         94.60 cm/s LVOT Vmean:        65.300 cm/s LVOT VTI:          0.176 m LVOT/AV VTI ratio: 0.68  AORTA Ao Root diam: 2.63 cm MITRAL VALVE               TRICUSPID VALVE MV Area (PHT): 4.19 cm    TR Peak grad:   11.8 mmHg MV Decel Time: 181 msec    TR Vmax:        172.00 cm/s MV E velocity: 93.00 cm/s MV A velocity: 58.30 cm/s  SHUNTS MV E/A ratio:  1.60        Systemic VTI:  0.18 m                            Systemic Diam: 2.00 cm Bartholome Bill MD Electronically signed by Bartholome Bill  MD Signature Date/Time: 06/12/2021/10:27:34 AM    Final     Assessment & Plan     55 year old female with history of hypertension, morbid obesity, Buerger's disease, obstructive sleep apnea, history of Hashimoto's thyroiditis who was admitted with chest pain.  Had elevated serum troponins peaking at 12,000.  Currently 3000.  Cardiac cath revealed normal coronary arteries.  Likely viral myocarditis.  1.  Elevated troponin-normal coronaries.  Normal ejection fraction with no regional wall motion admitted.  We will continue with colchicine.  Okay for discharge from a cardiac standpoint with outpatient follow-up with cardiology if desired.  2.  Hypertension-continue with current regimen including amlodipine for possible coronary spasm.  3.  Buerger's disease-stable  4.  Sleep apnea-continue with sleep apnea therapy with CPAP  5.  Obesity-due to caloric excess.  Weight loss recommended.  The patient appears ready for discharge from a cardiac standpoint on current medical regimen.  Signed, Javier Docker Gale Klar MD 06/12/2021, 10:52 AM  Pager: [phone]

## 2021-06-12 NOTE — ED Notes (Signed)
Pt taken to cath lab at this time .

## 2021-06-12 NOTE — Progress Notes (Signed)
[place] [facility] [place] NAME: [name]    MR#:  [id number]  DATE OF BIRTH:  29-Jun-1966  SUBJECTIVE:  keep it withdrawal paid chest with persistent eliquis of chest discomfort. Underwent urgent cath showed normal coronaries. Complains of some dull chest pain. Denies any recent viral illness or fever.  REVIEW OF SYSTEMS:   Review of Systems  Constitutional:  Negative for chills, fever and weight loss.  HENT:  Negative for ear discharge, ear pain and nosebleeds.   Eyes:  Negative for blurred vision, pain and discharge.  Respiratory:  Negative for sputum production, shortness of breath, wheezing and stridor.   Cardiovascular:  Positive for chest pain. Negative for palpitations, orthopnea and PND.  Gastrointestinal:  Negative for abdominal pain, diarrhea, nausea and vomiting.  Genitourinary:  Negative for frequency and urgency.  Musculoskeletal:  Negative for back pain and joint pain.  Neurological:  Positive for weakness. Negative for sensory change, speech change and focal weakness.  Psychiatric/Behavioral:  Negative for depression and hallucinations. The patient is not nervous/anxious.   Tolerating Diet: Tolerating PT:   DRUG ALLERGIES:   Allergies  Allergen Reactions   Yeast-Related Products    Tape Rash    Some tapes cause a rash, possible the paper tape.    VITALS:  Blood pressure (!) 109/93, pulse 64, temperature 98.5 F (36.9 C), resp. rate 19, height 5\' 7"  (1.702 m), weight (!) 160 kg, SpO2 98 %.  PHYSICAL EXAMINATION:   Physical Exam  GENERAL:  55 y.o.-year-old patient lying in the bed with no acute distress. Morbidly obese LUNGS: Normal breath sounds bilaterally, no wheezing, rales, rhonchi. No use of accessory muscles of respiration.  CARDIOVASCULAR: S1, S2 normal. No murmurs, rubs, or gallops.  ABDOMEN: Soft, nontender, nondistended. Bowel sounds present. No organomegaly or mass.  EXTREMITIES: No cyanosis, clubbing or  edema b/l.    NEUROLOGIC: Cranial nerves II through XII are intact. No focal Motor or sensory deficits b/l.   PSYCHIATRIC:  patient is alert and oriented x 3.  SKIN: No obvious rash, lesion, or ulcer.   LABORATORY PANEL:  CBC Recent Labs  Lab 06/12/21 0412  WBC 12.7*  HGB 13.7  HCT 40.2  PLT 308    Chemistries  Recent Labs  Lab 06/11/21 1440  NA 139  K 3.6  CL 106  CO2 22  GLUCOSE 119*  BUN 15  CREATININE 0.90  CALCIUM 9.0   Cardiac Enzymes No results for input(s): TROPONINI in the last 168 hours. RADIOLOGY:  DG Chest 2 View  Result Date: 06/11/2021 CLINICAL DATA:  Chest pain EXAM: CHEST - 2 VIEW COMPARISON:  04/03/2010 FINDINGS: No focal opacity or pleural effusion. Borderline cardiomegaly with slight central congestion. No pneumothorax. IMPRESSION: Borderline cardiomegaly with slight central congestion Electronically Signed   By: Donavan Foil [name].   On: 06/11/2021 15:30   CT Angio Chest PE W and/or Wo Contrast  Result Date: 06/11/2021 CLINICAL DATA:  Chest pain EXAM: CT ANGIOGRAPHY CHEST WITH CONTRAST TECHNIQUE: Multidetector CT imaging of the chest was performed using the standard protocol during bolus administration of intravenous contrast. Multiplanar CT image reconstructions and MIPs were obtained to evaluate the vascular anatomy. CONTRAST:  126mL OMNIPAQUE IOHEXOL 350 MG/ML SOLN COMPARISON:  09/10/2020 FINDINGS: Cardiovascular: No filling defects in the pulmonary arteries to suggest pulmonary emboli. Heart is borderline in size. Aorta normal caliber. Mediastinum/Nodes: No mediastinal, hilar, or axillary adenopathy. Trachea and esophagus are unremarkable. Thyroid unremarkable. Lungs/Pleura: Lungs are clear. No  focal airspace opacities or suspicious nodules. No effusions. Upper Abdomen: Imaging into the upper abdomen demonstrates no acute findings. Musculoskeletal: Chest wall soft tissues are unremarkable. No acute bony abnormality. Review of the MIP images confirms the  above findings. IMPRESSION: No evidence of pulmonary embolus. Borderline heart size. No acute cardiopulmonary disease. Electronically Signed   By: Rolm Baptise [name].   On: 06/11/2021 19:37   CARDIAC CATHETERIZATION  Result Date: 06/12/2021 1.  Normal coronary anatomy 2.  Normal left ventricular function Recommendations 1.  Dual antiplatelet therapy 2.  DC nitroglycerin drip 3.  DC heparin drip 4.  Continue metoprolol titrate 5.  Nitropaste 1 inch every 6 hours 6.  2D echocardiogram  ECHOCARDIOGRAM COMPLETE  Result Date: 06/12/2021    ECHOCARDIOGRAM REPORT   Patient Name:   [name] Date of Exam: 06/12/2021 Medical Rec #:  [id number]     Height:       67.0 in Accession #:    [id number]    Weight:       352.7 lb Date of Birth:  1966-11-24     BSA:          2.574 m Patient Age:    58 years      BP:           107/65 mmHg Patient Gender: F             HR:           60 bpm. Exam Location:  [facility] Procedure: 2D Echo, Cardiac Doppler and Color Doppler Indications:     Acute myocardial Infarction -unspecified I21.9  History:         Patient has prior history of Echocardiogram examinations, most                  recent 06/27/2018. Risk Factors:Hypertension.  Sonographer:     Sherrie Sport RDCS (AE) Referring Phys:  [place] Diagnosing Phys: Bartholome Bill MD  Sonographer Comments: Suboptimal apical window and no subcostal window. IMPRESSIONS  1. Left ventricular ejection fraction, by estimation, is 65 to 70%. The left ventricle has normal function. The left ventricle has no regional wall motion abnormalities. There is mild left ventricular hypertrophy. Left ventricular diastolic parameters were normal.  2. Right ventricular systolic function is normal. The right ventricular size is normal.  3. The mitral valve was not well visualized. Trivial mitral valve regurgitation.  4. The aortic valve is grossly normal. Aortic valve regurgitation is trivial. FINDINGS  Left Ventricle: Left ventricular ejection fraction,  by estimation, is 65 to 70%. The left ventricle has normal function. The left ventricle has no regional wall motion abnormalities. The left ventricular internal cavity size was normal in size. There is  mild left ventricular hypertrophy. Left ventricular diastolic parameters were normal. Right Ventricle: The right ventricular size is normal. No increase in right ventricular wall thickness. Right ventricular systolic function is normal. Left Atrium: Left atrial size was normal in size. Right Atrium: Right atrial size was normal in size. Pericardium: There is no evidence of pericardial effusion. Mitral Valve: The mitral valve was not well visualized. Trivial mitral valve regurgitation. Tricuspid Valve: The tricuspid valve is not well visualized. Tricuspid valve regurgitation is mild. Aortic Valve: The aortic valve is grossly normal. Aortic valve regurgitation is trivial. Aortic valve mean gradient measures 3.0 mmHg. Aortic valve peak gradient measures 6.6 mmHg. Aortic valve area, by VTI measures 2.13 cm. Pulmonic Valve: The pulmonic valve was not well visualized. Pulmonic valve regurgitation  is trivial. Aorta: The aortic root is normal in size and structure. IAS/Shunts: The interatrial septum was not well visualized.  LEFT VENTRICLE PLAX 2D LVIDd:         4.80 cm  Diastology LVIDs:         2.63 cm  LV e' medial:    8.05 cm/s LV PW:         1.38 cm  LV E/e' medial:  11.6 LV IVS:        0.92 cm  LV e' lateral:   9.68 cm/s LVOT diam:     2.00 cm  LV E/e' lateral: 9.6 LV SV:         55 LV SV Index:   21 LVOT Area:     3.14 cm  RIGHT VENTRICLE RV Basal diam:  3.42 cm RV S prime:     13.80 cm/s TAPSE (M-mode): 3.2 cm LEFT ATRIUM             Index       RIGHT ATRIUM           Index LA diam:        3.40 cm 1.32 cm/m  RA Area:     17.70 cm LA Vol (A2C):   64.9 ml 25.22 ml/m RA Volume:   45.80 ml  17.80 ml/m LA Vol (A4C):   25.4 ml 9.87 ml/m LA Biplane Vol: 41.2 ml 16.01 ml/m  AORTIC VALVE                   PULMONIC  VALVE AV Area (Vmax):    2.32 cm    PV Vmax:        0.76 m/s AV Area (Vmean):   2.45 cm    PV Peak grad:   2.3 mmHg AV Area (VTI):     2.13 cm    RVOT Peak grad: 2 mmHg AV Vmax:           128.00 cm/s AV Vmean:          83.700 cm/s AV VTI:            0.260 m AV Peak Grad:      6.6 mmHg AV Mean Grad:      3.0 mmHg LVOT Vmax:         94.60 cm/s LVOT Vmean:        65.300 cm/s LVOT VTI:          0.176 m LVOT/AV VTI ratio: 0.68  AORTA Ao Root diam: 2.63 cm MITRAL VALVE               TRICUSPID VALVE MV Area (PHT): 4.19 cm    TR Peak grad:   11.8 mmHg MV Decel Time: 181 msec    TR Vmax:        172.00 cm/s MV E velocity: 93.00 cm/s MV A velocity: 58.30 cm/s  SHUNTS MV E/A ratio:  1.60        Systemic VTI:  0.18 m                            Systemic Diam: 2.00 cm Bartholome Bill MD Electronically signed by Bartholome Bill MD Signature Date/Time: 06/12/2021/10:27:34 AM    Final    ASSESSMENT AND PLAN:   [name] is a 55 y.o. female with medical history significant of Buerger's disease, morbid obesity, GERD, SIBO, essential hypertension, hypothyroidism, iron deficiency anemia, who presents with chest  pain that is centrally located radiating to her jaw.  Symptoms started around noon today.  This has persisted and not remitting.  non-ST elevation MI elevated troponin -- patient was initiated on IV heparin, prn nitroglycerin, as well as beta-blocker, aspirin and statin.  -- Cardiology consult noted. Underwent emergent cardiac cath by Dr. Saralyn Pilar. Cath showed normal coronaries. -- given normal cath and chest pain along with elevated troponin differential includes myocarditis, ? Coronary visual spasm with history of raynauds -- seen by Dr. Ubaldo Glassing. Recommends start colchicine daily. Continue aspirin. -- troponin peaked at 11,000-- trending down    morbid obesity: Counseling provided.  Patient has been on keto diet trying to lose weight.   peripheral vascular disease/Buerger's dz --Patient on Pletal,statin.    essential hypertension:  --amlodipine and metoprolol    hypothyroidism: On levothyroxine    GERD: Continue Protonix.   obstructive sleep apnea with morbid Obesity cont CPAP at night.   Procedures: cath Family communication :dter in the room Consults :cardiology CODE STATUS: full DVT Prophylaxis :SCD Level of care: Med-Surg Status is: Inpatient  Remains inpatient appropriate because:Inpatient level of care appropriate due to severity of illness  Dispo: The patient is from: Home              Anticipated d/c is to: Home              Patient currently is medically stable to d/c.   Difficult to place patient No        TOTAL TIME TAKING CARE OF THIS PATIENT: 25 minutes.  >50% time spent on counselling and coordination of care  Note: This dictation was prepared with Dragon dictation along with smaller phrase technology. Any transcriptional errors that result from this process are unintentional.  [name]    [facility]   CC: Primary care physician; Lavera Guise, MD Patient ID: [name], female   DOB: 18-Jul-1966, 55 y.o.   MRN: [id number]

## 2021-06-12 NOTE — ED Notes (Signed)
This RN notified cardiologist via secure chat: "She came in around 1400 yesterday for left sided chest pain radiating to her jaw. Pt has had serial trending upward troponins, starting at 26 and most recent as of 0034 is 11,653. Pt has had minimal relief with nitro drip and is still in active chest pain. Pt have gotten updated EKGs and have seen no changes, but pt is still in discomfort. I know our ED docs were concerned about her and thought she should go to cath lab. I will continue to trend troponins and EKGs for any changes. Most recent vitals are HR 74, 95% O2 sat, BP 112/62, RR 19."

## 2021-06-13 LAB — TROPONIN I (HIGH SENSITIVITY): Troponin I (High Sensitivity): 3745 ng/L (ref <18)

## 2021-06-13 MED ORDER — COLCHICINE 0.6 MG PO TABS: 0.6000 mg | ORAL_TABLET | Freq: Two times a day (BID) | ORAL | 0 refills | Status: DC

## 2021-06-13 MED ORDER — ASPIRIN 325 MG PO TBEC: 325.0000 mg | DELAYED_RELEASE_TABLET | Freq: Every day | ORAL | 0 refills | Status: DC

## 2021-06-13 MED ORDER — MORPHINE SULFATE (PF) 2 MG/ML IV SOLN: 2.0000 mg | INTRAVENOUS | Status: DC | PRN

## 2021-06-13 MED ORDER — NITROGLYCERIN 2 % TD OINT
0.5000 [in_us] | TOPICAL_OINTMENT | Freq: Four times a day (QID) | TRANSDERMAL | Status: DC
  Administered 2021-06-13: 0.5 [in_us] via TOPICAL
  Filled 2021-06-13: qty 1

## 2021-06-13 MED ORDER — NITROGLYCERIN 0.4 MG SL SUBL: 0.4000 mg | SUBLINGUAL_TABLET | SUBLINGUAL | Status: DC | PRN

## 2021-06-13 NOTE — Progress Notes (Signed)
Patient Name: [name] Date of Encounter: 06/13/2021  Hospital Problem List     Principal Problem:   NSTEMI (non-ST elevated myocardial infarction) [facility]) Active Problems:   Morbid obesity with BMI of 50.0-59.9, adult (HCC)   Hypertension   Buerger's disease ([place])   Decreased functional mobility   Peripheral vascular disease ([place])   Obstructive sleep apnea   Hypothyroidism due to Hashimoto's thyroiditis   Gastroesophageal reflux disease    Patient Profile     55 year old female who presented to the hospital with chest pain with an elevated troponin.  Cardiac cath revealed normal coronary arteries.  EF normal with no regional wall motion abnormality by echo.  Troponin is improving.  Chest pain is improving.  She has sleep apnea on BiPAP.  Episode of pain likely was a viral myocarditis.  Does not appear to be Takotsubo's based on EF and symptoms and history.  Subjective   Improved somewhat but still with some shortness of breath and atypical chest pain but improved.  Inpatient Medications     acidophilus  1 capsule Oral Daily   amLODipine  10 mg Oral QHS   aspirin EC  325 mg Oral Daily   Chlorhexidine Gluconate Cloth  6 each Topical Daily   cilostazol  50 mg Oral BID   colchicine  0.6 mg Oral BID   famotidine  20 mg Oral BID   folic acid  1 mg Oral Daily   levothyroxine  150 mcg Oral Q0600   mouth rinse  15 mL Mouth Rinse BID   nitroGLYCERIN  0.5 inch Topical Q6H   pantoprazole  40 mg Oral BID   sodium chloride flush  3 mL Intravenous Q12H    Vital Signs    Vitals:   06/13/21 0313 06/13/21 0400 06/13/21 0414 06/13/21 0700  BP: (!) 132/92 (!) 111/59    Pulse: 75 68 65 (!) 56  Resp: 20 14 14 15   Temp:      TempSrc:      SpO2: 99% 93% 95% 99%  Weight:      Height:        Intake/Output Summary (Last 24 hours) at 06/13/2021 0846 Last data filed at 06/13/2021 0300 Gross per 24 hour  Intake 363 ml  Output 1850 ml  Net -1487 ml   Filed Weights   06/11/21 1438  06/12/21 0400  Weight: (!) 161.9 kg (!) 160 kg    Physical Exam    GEN: Obesity HEENT: normal.  Neck: Supple, no JVD, carotid bruits, or masses. Cardiac: RRR, no murmurs, rubs, or gallops. No clubbing, cyanosis, edema.  Radials/DP/PT 2+ and equal bilaterally.  Respiratory:  Respirations regular.  No wheezing or rhonchi. GI: Soft, nontender, nondistended, BS + x 4. MS: no deformity or atrophy. Skin: warm and dry, no rash. Neuro:  Strength and sensation are intact. Psych: Normal affect.  Labs    CBC Recent Labs    06/11/21 1440 06/12/21 0412  WBC 11.8* 12.7*  HGB 14.1 13.7  HCT 42.6 40.2  MCV 94.2 91.2  PLT 292 892   Basic Metabolic Panel Recent Labs    06/11/21 1440  NA 139  K 3.6  CL 106  CO2 22  GLUCOSE 119*  BUN 15  CREATININE 0.90  CALCIUM 9.0   Liver Function Tests Recent Labs    06/12/21 1038  AST 65*  ALT 27  ALKPHOS 90  BILITOT 1.0  PROT 7.1  ALBUMIN 3.4*   No results for input(s): LIPASE, AMYLASE  in the last 72 hours. Cardiac Enzymes No results for input(s): CKTOTAL, CKMB, CKMBINDEX, TROPONINI in the last 72 hours. BNP No results for input(s): BNP in the last 72 hours. D-Dimer No results for input(s): DDIMER in the last 72 hours. Hemoglobin A1C No results for input(s): HGBA1C in the last 72 hours. Fasting Lipid Panel No results for input(s): CHOL, HDL, LDLCALC, TRIG, CHOLHDL, LDLDIRECT in the last 72 hours. Thyroid Function Tests No results for input(s): TSH, T4TOTAL, T3FREE, THYROIDAB in the last 72 hours.  Invalid input(s): FREET3  Telemetry    Sinus rhythm  ECG    Sinus rhythm with no ischemia  Radiology    DG Chest 2 View  Result Date: 06/11/2021 CLINICAL DATA:  Chest pain EXAM: CHEST - 2 VIEW COMPARISON:  04/03/2010 FINDINGS: No focal opacity or pleural effusion. Borderline cardiomegaly with slight central congestion. No pneumothorax. IMPRESSION: Borderline cardiomegaly with slight central congestion Electronically Signed    By: Donavan Foil M.D.   On: 06/11/2021 15:30   CT Angio Chest PE W and/or Wo Contrast  Result Date: 06/11/2021 CLINICAL DATA:  Chest pain EXAM: CT ANGIOGRAPHY CHEST WITH CONTRAST TECHNIQUE: Multidetector CT imaging of the chest was performed using the standard protocol during bolus administration of intravenous contrast. Multiplanar CT image reconstructions and MIPs were obtained to evaluate the vascular anatomy. CONTRAST:  136mL OMNIPAQUE IOHEXOL 350 MG/ML SOLN COMPARISON:  09/10/2020 FINDINGS: Cardiovascular: No filling defects in the pulmonary arteries to suggest pulmonary emboli. Heart is borderline in size. Aorta normal caliber. Mediastinum/Nodes: No mediastinal, hilar, or axillary adenopathy. Trachea and esophagus are unremarkable. Thyroid unremarkable. Lungs/Pleura: Lungs are clear. No focal airspace opacities or suspicious nodules. No effusions. Upper Abdomen: Imaging into the upper abdomen demonstrates no acute findings. Musculoskeletal: Chest wall soft tissues are unremarkable. No acute bony abnormality. Review of the MIP images confirms the above findings. IMPRESSION: No evidence of pulmonary embolus. Borderline heart size. No acute cardiopulmonary disease. Electronically Signed   By: Rolm Baptise M.D.   On: 06/11/2021 19:37   CARDIAC CATHETERIZATION  Result Date: 06/12/2021 1.  Normal coronary anatomy 2.  Normal left ventricular function Recommendations 1.  Dual antiplatelet therapy 2.  DC nitroglycerin drip 3.  DC heparin drip 4.  Continue metoprolol titrate 5.  Nitropaste 1 inch every 6 hours 6.  2D echocardiogram  ECHOCARDIOGRAM COMPLETE  Result Date: 06/12/2021    ECHOCARDIOGRAM REPORT   Patient Name:   [name] Date of Exam: 06/12/2021 Medical Rec #:  [id number]     Height:       67.0 in Accession #:    [id number]    Weight:       352.7 lb Date of Birth:  [birth year]     BSA:          2.574 m Patient Age:    55 years      BP:           107/65 mmHg Patient Gender: F             HR:            60 bpm. Exam Location:  [facility] Procedure: 2D Echo, Cardiac Doppler and Color Doppler Indications:     Acute myocardial Infarction -unspecified I21.9  History:         Patient has prior history of Echocardiogram examinations, most                  recent 06/27/2018. Risk Factors:Hypertension.  Sonographer:  Sherrie Sport RDCS (AE) Referring Phys:  [place] Diagnosing Phys: Bartholome Bill MD  Sonographer Comments: Suboptimal apical window and no subcostal window. IMPRESSIONS  1. Left ventricular ejection fraction, by estimation, is 65 to 70%. The left ventricle has normal function. The left ventricle has no regional wall motion abnormalities. There is mild left ventricular hypertrophy. Left ventricular diastolic parameters were normal.  2. Right ventricular systolic function is normal. The right ventricular size is normal.  3. The mitral valve was not well visualized. Trivial mitral valve regurgitation.  4. The aortic valve is grossly normal. Aortic valve regurgitation is trivial. FINDINGS  Left Ventricle: Left ventricular ejection fraction, by estimation, is 65 to 70%. The left ventricle has normal function. The left ventricle has no regional wall motion abnormalities. The left ventricular internal cavity size was normal in size. There is  mild left ventricular hypertrophy. Left ventricular diastolic parameters were normal. Right Ventricle: The right ventricular size is normal. No increase in right ventricular wall thickness. Right ventricular systolic function is normal. Left Atrium: Left atrial size was normal in size. Right Atrium: Right atrial size was normal in size. Pericardium: There is no evidence of pericardial effusion. Mitral Valve: The mitral valve was not well visualized. Trivial mitral valve regurgitation. Tricuspid Valve: The tricuspid valve is not well visualized. Tricuspid valve regurgitation is mild. Aortic Valve: The aortic valve is grossly normal. Aortic valve regurgitation is  trivial. Aortic valve mean gradient measures 3.0 mmHg. Aortic valve peak gradient measures 6.6 mmHg. Aortic valve area, by VTI measures 2.13 cm. Pulmonic Valve: The pulmonic valve was not well visualized. Pulmonic valve regurgitation is trivial. Aorta: The aortic root is normal in size and structure. IAS/Shunts: The interatrial septum was not well visualized.  LEFT VENTRICLE PLAX 2D LVIDd:         4.80 cm  Diastology LVIDs:         2.63 cm  LV e' medial:    8.05 cm/s LV PW:         1.38 cm  LV E/e' medial:  11.6 LV IVS:        0.92 cm  LV e' lateral:   9.68 cm/s LVOT diam:     2.00 cm  LV E/e' lateral: 9.6 LV SV:         55 LV SV Index:   21 LVOT Area:     3.14 cm  RIGHT VENTRICLE RV Basal diam:  3.42 cm RV S prime:     13.80 cm/s TAPSE (M-mode): 3.2 cm LEFT ATRIUM             Index       RIGHT ATRIUM           Index LA diam:        3.40 cm 1.32 cm/m  RA Area:     17.70 cm LA Vol (A2C):   64.9 ml 25.22 ml/m RA Volume:   45.80 ml  17.80 ml/m LA Vol (A4C):   25.4 ml 9.87 ml/m LA Biplane Vol: 41.2 ml 16.01 ml/m  AORTIC VALVE                   PULMONIC VALVE AV Area (Vmax):    2.32 cm    PV Vmax:        0.76 m/s AV Area (Vmean):   2.45 cm    PV Peak grad:   2.3 mmHg AV Area (VTI):     2.13 cm    RVOT Peak  grad: 2 mmHg AV Vmax:           128.00 cm/s AV Vmean:          83.700 cm/s AV VTI:            0.260 m AV Peak Grad:      6.6 mmHg AV Mean Grad:      3.0 mmHg LVOT Vmax:         94.60 cm/s LVOT Vmean:        65.300 cm/s LVOT VTI:          0.176 m LVOT/AV VTI ratio: 0.68  AORTA Ao Root diam: 2.63 cm MITRAL VALVE               TRICUSPID VALVE MV Area (PHT): 4.19 cm    TR Peak grad:   11.8 mmHg MV Decel Time: 181 msec    TR Vmax:        172.00 cm/s MV E velocity: 93.00 cm/s MV A velocity: 58.30 cm/s  SHUNTS MV E/A ratio:  1.60        Systemic VTI:  0.18 m                            Systemic Diam: 2.00 cm Bartholome Bill MD Electronically signed by Bartholome Bill MD Signature Date/Time: 06/12/2021/10:27:34 AM    Final      Assessment & Plan    55 year old female with history of hypertension, morbid obesity, Buerger's disease, obstructive sleep apnea, history of Hashimoto's thyroiditis who was admitted with chest pain.  Had elevated serum troponins peaking at 12,000.  Currently 3000.  Cardiac cath revealed normal coronary arteries.  Likely viral myocarditis.  1.  Elevated troponin-normal coronaries.  Normal ejection fraction with no regional wall motion admitted.  We will continue with colchicine.  Okay for discharge from a cardiac standpoint with outpatient follow-up with cardiology if desired.  2.  Hypertension-continue with current regimen including amlodipine for possible coronary spasm.  3.  Buerger's disease-stable  4.  Sleep apnea-continue with sleep apnea therapy with CPAP  5.  Obesity-due to caloric excess.  Weight loss recommended.  Again patient appears ready for discharge from a cardiac standpoint on current medical regimen.  Signed, Javier Docker Meyer Dockery MD 06/13/2021, 8:46 AM  Pager: [phone]

## 2021-06-13 NOTE — Progress Notes (Signed)
Patient c/o chest pain 3/10 "achy', PRN dilaudid given, improved to 2/10. Dr.Mancy notified. See new orders.

## 2021-06-13 NOTE — Evaluation (Signed)
Physical Therapy Evaluation Patient Details Name: [name] MRN: [id number] DOB: 31-Dec-1965 Today's Date: 06/13/2021   History of Present Illness  Patient is a 55 yo female that presented to ED due to chest pain, work up showed chest pain with elevated troponin, per chart "suspect viral myocarditis". PMH of Buerger's disease, GERD, SIBO, HTN, BCIR.   Clinical Impression  [name], denied pain. Pt reported at baseline she is able to transfer to her WC modI, ambulates very short distances to bathroom. Family available to assist as needed.  Prolonged session for pt to empty BCIR. Pt performed bed mobility modI, and stand pivot transfers twice with CGA and UE support. maxA for pericare due to malfunctioned purewick. Returned to bed with all needs in reach. The patient demonstrated near return to baseline level of function, but would benefit from further skilled PT intervention to maximize strength, independence, and mobility.     Follow Up Recommendations Home health PT;Supervision - Intermittent    Equipment Recommendations  None recommended by PT    Recommendations for Other Services       Precautions / Restrictions Precautions Precautions: Fall Restrictions Weight Bearing Restrictions: No      Mobility  Bed Mobility Overal bed mobility: Needs Assistance Bed Mobility: Supine to Sit;Sit to Supine;Rolling Rolling: Min guard   Supine to sit: Min guard;HOB elevated Sit to supine: Min guard;HOB elevated        Transfers Overall transfer level: Needs assistance   Transfers: Sit to/from [facility] Sit to Stand: Min guard Stand pivot transfers: Min guard       General transfer comment: reliant on UE support  Ambulation/Gait                Stairs            Wheelchair Mobility    Modified Rankin (Stroke Patients Only)       Balance Overall balance assessment: Needs assistance Sitting-balance support: Feet supported Sitting  balance-Leahy Scale: Good     Standing balance support: Bilateral upper extremity supported Standing balance-Leahy Scale: Fair                               Pertinent Vitals/Pain Pain Assessment: No/denies pain    Home Living Family/patient expects to be discharged to:: Private residence Living Arrangements: Spouse/significant other;Children Available Help at Discharge: Family;Available PRN/intermittently Type of Home: House Home Access: Ramped entrance     Home Layout: One level Home Equipment: Wheelchair - manual;Cane - single point;Walker - 2 wheels Additional Comments: no falls    Prior Function Level of Independence: Independent with assistive device(s)         Comments: bird bath at sink, stand pivot at baseline, short ambulatory distances to commode in home     Hand Dominance   Dominant Hand: Right    Extremity/Trunk Assessment   Upper Extremity Assessment Upper Extremity Assessment: Overall WFL for tasks assessed    Lower Extremity Assessment Lower Extremity Assessment: Overall WFL for tasks assessed    Cervical / Trunk Assessment Cervical / Trunk Assessment: Normal  Communication   Communication: No difficulties  Cognition Arousal/Alertness: Awake/alert Behavior During Therapy: WFL for tasks assessed/performed Overall Cognitive Status: Within Functional Limits for tasks assessed  General Comments      Exercises     Assessment/Plan    PT Assessment Patient needs continued PT services  PT Problem List Decreased strength;Decreased mobility;Decreased range of motion;Decreased activity tolerance;Decreased balance;Decreased knowledge of use of DME       PT Treatment Interventions Therapeutic exercise;DME instruction;Balance training;Gait training;Stair training;Neuromuscular re-education;Functional mobility training;Therapeutic activities;Patient/family education    PT Goals  (Current goals can be found in the Care Plan section)  Acute Rehab PT Goals Patient Stated Goal: to go home PT Goal Formulation: With patient Time For Goal Achievement: 06/27/21 Potential to Achieve Goals: Good    Frequency Min 2X/week   Barriers to discharge        Co-evaluation               AM-PAC PT "6 Clicks" Mobility  Outcome Measure Help needed turning from your back to your side while in a flat bed without using bedrails?: A Little Help needed moving from lying on your back to sitting on the side of a flat bed without using bedrails?: A Little Help needed moving to and from a bed to a chair (including a wheelchair)?: A Little Help needed standing up from a chair using your arms (e.g., wheelchair or bedside chair)?: A Little Help needed to walk in hospital room?: A Little Help needed climbing 3-5 steps with a railing? : Total 6 Click Score: 16    End of Session Equipment Utilized During Treatment: Oxygen Activity Tolerance: Patient tolerated treatment well Patient left: in bed;with call bell/phone within reach;with nursing/sitter in room Nurse Communication: Mobility status PT Visit Diagnosis: Other abnormalities of gait and mobility (R26.89);Difficulty in walking, not elsewhere classified (R26.2);Muscle weakness (generalized) (M62.81)    Time: 7915-0569 PT Time Calculation (min) (ACUTE ONLY): 44 min   Charges:   PT Evaluation $PT Eval Moderate Complexity: 1 Mod PT Treatments $Therapeutic Activity: 23-37 mins       Lieutenant Diego PT, DPT 3:57 PM,06/13/21

## 2021-06-13 NOTE — Discharge Summary (Signed)
[name] NAME: [name]    MR#:  [id number]  DATE OF BIRTH:  Sep 25, 1966  DATE OF ADMISSION:  06/11/2021 ADMITTING PHYSICIAN: Elwyn Reach, MD  DATE OF DISCHARGE: 06/13/2021  PRIMARY CARE PHYSICIAN: Lavera Guise, MD    ADMISSION DIAGNOSIS:  NSTEMI (non-ST elevated myocardial infarction) ([place]) [I21.4]  DISCHARGE DIAGNOSIS:  Chest pain with elevated troponin suspect viral myocarditis  SECONDARY DIAGNOSIS:   Past Medical History:  Diagnosis Date   Berger's disease 2011   Buerger's disease ([place])    Family history of adverse reaction to anesthesia    sister and daughter difficult to wake up   Folic acid deficiency    GERD (gastroesophageal reflux disease)    Hypertension    Hypothyroidism    IDA (iron deficiency anemia)    Osteoarthritis    Thrombocytosis     HOSPITAL COURSE:   [name] is a 55 y.o. female with medical history significant of Buerger's disease, morbid obesity, GERD, SIBO, essential hypertension, hypothyroidism, iron deficiency anemia, who presents with chest pain that is centrally located radiating to her jaw.  Symptoms started around noon today.  This has persisted and not remitting.   non-ST elevation MI ruled out with normal cath elevated troponin -- patient was initiated on IV heparin, prn nitroglycerin, as well as beta-blocker, aspirin and statin. -- Cardiology consult noted. Underwent emergent cardiac cath by Dr. Saralyn Pilar. Cath showed normal coronaries. -- given normal cath and chest pain along with elevated troponin differential includes viral myocarditis, ? Coronary visual spasm with history of raynauds -- seen by Dr. Ubaldo Glassing. Recommends start colchicine daily. Continue aspirin. -- troponin peaked at 11,000-- trending down to 3K --Tele--NSR   morbid obesity/H/o gastric sleeve/h/o SIBO : Counseling provided.  Patient has been on keto diet trying to lose weight.   peripheral vascular  disease/Buerger's dz -- on Pletal,statin.    essential hypertension:  --amlodipine and metoprolol   hypothyroidism: On levothyroxine   GERD: Continue Protonix.   obstructive sleep apnea with morbid Obesity cont CPAP at night.   Ok from cardiology standpoint for d/c D/w pt   Procedures: cath Family communication :dter in the room 7/1, none today Consults :cardiology CODE STATUS: full DVT Prophylaxis :SCD Level of care: Med-Surg Status is: Inpatient     Dispo: The patient is from: Home              Anticipated d/c is to: Home              Patient currently is medically stable to d/c.              Difficult to place patient No CONSULTS OBTAINED:  Treatment Team:  Teodoro Spray, MD  DRUG ALLERGIES:   Allergies  Allergen Reactions   Yeast-Related Products    Tape Rash    Some tapes cause a rash, possible the paper tape.    DISCHARGE MEDICATIONS:   Allergies as of 06/13/2021       Reactions   Yeast-related Products    Tape Rash   Some tapes cause a rash, possible the paper tape.        Medication List     TAKE these medications    amLODipine 10 MG tablet Commonly known as: NORVASC TAKE ONE TABLET BY MOUTH EVERY NIGHT AT BEDTIME   aspirin 325 MG EC tablet Take 1 tablet (325 mg total) by mouth daily. What changed:  medication strength how  much to take   cilostazol 50 MG tablet Commonly known as: PLETAL Take 50 mg by mouth 2 (two) times daily.   clotrimazole-betamethasone cream Commonly known as: LOTRISONE Apply 1 application topically 2 (two) times daily.   colchicine 0.6 MG tablet Take 1 tablet (0.6 mg total) by mouth 2 (two) times daily for 14 days.   Cranberry 1000 MG Caps Take 1 tablet by mouth daily.   famotidine 20 MG tablet Commonly known as: PEPCID TAKE ONE TABLET BY MOUTH TWICE A DAY   Flinstones Gummies Omega-3 DHA Chew Chew 2 tablets by mouth daily.   folic acid 1 MG tablet Commonly known as: FOLVITE Take 1 tablet (1 mg  total) by mouth daily.   levonorgestrel 20 MCG/24HR IUD Commonly known as: MIRENA by Intrauterine route.   levothyroxine 150 MCG tablet Commonly known as: SYNTHROID Take 1 tablet (150 mcg total) by mouth daily before breakfast.   pantoprazole 40 MG tablet Commonly known as: PROTONIX Take 1 tablet (40 mg total) by mouth 2 (two) times daily.   PROBIOTIC ADVANCED PO Take 1 capsule by mouth daily.   Vitamin D (Ergocalciferol) 1.25 MG (50000 UNIT) Caps capsule Commonly known as: DRISDOL TAKE 1 CAPSULE ONCE A WEEK What changed:  how much to take how to take this when to take this        If you experience worsening of your admission symptoms, develop shortness of breath, life threatening emergency, suicidal or homicidal thoughts you must seek medical attention immediately by calling 911 or calling your MD immediately  if symptoms less severe.  You Must read complete instructions/literature along with all the possible adverse reactions/side effects for all the Medicines you take and that have been prescribed to you. Take any new Medicines after you have completely understood and accept all the possible adverse reactions/side effects.   Please note  You were cared for by a hospitalist during your hospital stay. If you have any questions about your discharge medications or the care you received while you were in the hospital after you are discharged, you can call the unit and asked to speak with the hospitalist on call if the hospitalist that took care of you is not available. Once you are discharged, your primary care physician will handle any further medical issues. Please note that NO REFILLS for any discharge medications will be authorized once you are discharged, as it is imperative that you return to your primary care physician (or establish a relationship with a primary care physician if you do not have one) for your aftercare needs so that they can reassess your need for medications  and monitor your lab values. Today   SUBJECTIVE   No new complaints  VITAL SIGNS:  Blood pressure 104/65, pulse (!) 58, temperature 99.4 F (37.4 C), temperature source Oral, resp. rate 14, height 5\' 7"  (1.702 m), weight (!) 160 kg, SpO2 96 %.  I/O:   Intake/Output Summary (Last 24 hours) at 06/13/2021 1032 Last data filed at 06/13/2021 0300 Gross per 24 hour  Intake 243 ml  Output 1550 ml  Net -1307 ml    PHYSICAL EXAMINATION:  GENERAL:  55 y.o.-year-old patient lying in the bed with no acute distress. Morbidly obese LUNGS: Normal breath sounds bilaterally, no wheezing, rales, rhonchi. No use of accessory muscles of respiration. CARDIOVASCULAR: S1, S2 normal. No murmurs, rubs, or gallops. ABDOMEN: Soft, nontender, nondistended. Bowel sounds present. No organomegaly or mass. EXTREMITIES: No cyanosis, clubbing or edema b/l.    NEUROLOGIC:  Cranial nerves II through XII are intact. No focal Motor or sensory deficits b/l.   PSYCHIATRIC:  patient is alert and oriented x 3. SKIN: No obvious rash, lesion, or ulcer. DATA REVIEW:   CBC  Recent Labs  Lab 06/12/21 0412  WBC 12.7*  HGB 13.7  HCT 40.2  PLT 308    Chemistries  Recent Labs  Lab 06/11/21 1440 06/12/21 1038  NA 139  --   K 3.6  --   CL 106  --   CO2 22  --   GLUCOSE 119*  --   BUN 15  --   CREATININE 0.90  --   CALCIUM 9.0  --   AST  --  65*  ALT  --  27  ALKPHOS  --  90  BILITOT  --  1.0    Microbiology Results   Recent Results (from the past 240 hour(s))  Urine Culture     Status: None   Collection Time: 06/03/21  2:45 PM   Specimen: Urine   UR  Result Value Ref Range Status   Urine Culture, Routine Final report  Final   Organism ID, Bacteria No growth  Final  Resp Panel by RT-PCR (Flu A&B, Covid) Nasopharyngeal Swab     Status: None   Collection Time: 06/11/21  8:00 PM   Specimen: Nasopharyngeal Swab; Nasopharyngeal(NP) swabs in vial transport medium  Result Value Ref Range Status   SARS  Coronavirus 2 by RT PCR NEGATIVE NEGATIVE Final    Comment: (NOTE) SARS-CoV-2 target nucleic acids are NOT DETECTED.  The SARS-CoV-2 RNA is generally detectable in upper respiratory specimens during the acute phase of infection. The lowest concentration of SARS-CoV-2 viral copies this assay can detect is 138 copies/mL. A negative result does not preclude SARS-Cov-2 infection and should not be used as the sole basis for treatment or other patient management decisions. A negative result may occur with  improper specimen collection/handling, submission of specimen other than nasopharyngeal swab, presence of viral mutation(s) within the areas targeted by this assay, and inadequate number of viral copies(<138 copies/mL). A negative result must be combined with clinical observations, patient history, and epidemiological information. The expected result is Negative.  Fact Sheet for Patients:  EntrepreneurPulse.com.au  Fact Sheet for Healthcare Providers:  IncredibleEmployment.be  This test is no t yet approved or cleared by the [place] FDA and  has been authorized for detection and/or diagnosis of SARS-CoV-2 by FDA under an Emergency Use Authorization (EUA). This EUA will remain  in effect (meaning this test can be used) for the duration of the COVID-19 declaration under Section 564(b)(1) of the Act, 21 U.S.C.section 360bbb-3(b)(1), unless the authorization is terminated  or revoked sooner.       Influenza A by PCR NEGATIVE NEGATIVE Final   Influenza B by PCR NEGATIVE NEGATIVE Final    Comment: (NOTE) The Xpert Xpress SARS-CoV-2/FLU/RSV plus assay is intended as an aid in the diagnosis of influenza from Nasopharyngeal swab specimens and should not be used as a sole basis for treatment. Nasal washings and aspirates are unacceptable for Xpert Xpress SARS-CoV-2/FLU/RSV testing.  Fact Sheet for  Patients: EntrepreneurPulse.com.au  Fact Sheet for Healthcare Providers: IncredibleEmployment.be  This test is not yet approved or cleared by the [place] FDA and has been authorized for detection and/or diagnosis of SARS-CoV-2 by FDA under an Emergency Use Authorization (EUA). This EUA will remain in effect (meaning this test can be used) for the duration of the COVID-19 declaration under Section  564(b)(1) of the Act, 21 U.S.C. section 360bbb-3(b)(1), unless the authorization is terminated or revoked.  Performed at [facility], [address] [place], [place] [address]   MRSA Next Gen by PCR, Nasal     Status: None   Collection Time: 06/12/21  4:10 AM   Specimen: Nasal Mucosa; Nasal Swab  Result Value Ref Range Status   MRSA by PCR Next Gen NOT DETECTED NOT DETECTED Final    Comment: (NOTE) The GeneXpert MRSA Assay (FDA approved for NASAL specimens only), is one component of a comprehensive MRSA colonization surveillance program. It is not intended to diagnose MRSA infection nor to guide or monitor treatment for MRSA infections. Test performance is not FDA approved in patients less than 15 years old. Performed at [facility], [address] [place], [place] [address]     RADIOLOGY:  DG Chest 2 View  Result Date: 06/11/2021 CLINICAL DATA:  Chest pain EXAM: CHEST - 2 VIEW COMPARISON:  04/03/2010 FINDINGS: No focal opacity or pleural effusion. Borderline cardiomegaly with slight central congestion. No pneumothorax. IMPRESSION: Borderline cardiomegaly with slight central congestion Electronically Signed   By: Donavan Foil M.D.   On: 06/11/2021 15:30   CT Angio Chest PE W and/or Wo Contrast  Result Date: 06/11/2021 CLINICAL DATA:  Chest pain EXAM: CT ANGIOGRAPHY CHEST WITH CONTRAST TECHNIQUE: Multidetector CT imaging of the chest was performed using the standard protocol during bolus administration of intravenous  contrast. Multiplanar CT image reconstructions and MIPs were obtained to evaluate the vascular anatomy. CONTRAST:  156mL OMNIPAQUE IOHEXOL 350 MG/ML SOLN COMPARISON:  09/10/2020 FINDINGS: Cardiovascular: No filling defects in the pulmonary arteries to suggest pulmonary emboli. Heart is borderline in size. Aorta normal caliber. Mediastinum/Nodes: No mediastinal, hilar, or axillary adenopathy. Trachea and esophagus are unremarkable. Thyroid unremarkable. Lungs/Pleura: Lungs are clear. No focal airspace opacities or suspicious nodules. No effusions. Upper Abdomen: Imaging into the upper abdomen demonstrates no acute findings. Musculoskeletal: Chest wall soft tissues are unremarkable. No acute bony abnormality. Review of the MIP images confirms the above findings. IMPRESSION: No evidence of pulmonary embolus. Borderline heart size. No acute cardiopulmonary disease. Electronically Signed   By: Rolm Baptise M.D.   On: 06/11/2021 19:37   CARDIAC CATHETERIZATION  Result Date: 06/12/2021 1.  Normal coronary anatomy 2.  Normal left ventricular function Recommendations 1.  Dual antiplatelet therapy 2.  DC nitroglycerin drip 3.  DC heparin drip 4.  Continue metoprolol titrate 5.  Nitropaste 1 inch every 6 hours 6.  2D echocardiogram  ECHOCARDIOGRAM COMPLETE  Result Date: 06/12/2021    ECHOCARDIOGRAM REPORT   Patient Name:   [name] Date of Exam: 06/12/2021 Medical Rec #:  [id number]     Height:       67.0 in Accession #:    [id number]    Weight:       352.7 lb Date of Birth:  06-11-1966     BSA:          2.574 m Patient Age:    55 years      BP:           107/65 mmHg Patient Gender: F             HR:           60 bpm. Exam Location:  [facility] Procedure: 2D Echo, Cardiac Doppler and Color Doppler Indications:     Acute myocardial Infarction -unspecified I21.9  History:         Patient  has prior history of Echocardiogram examinations, most                  recent 06/27/2018. Risk Factors:Hypertension.  Sonographer:     Sherrie Sport RDCS (AE) Referring Phys:  [place] Diagnosing Phys: Bartholome Bill MD  Sonographer Comments: Suboptimal apical window and no subcostal window. IMPRESSIONS  1. Left ventricular ejection fraction, by estimation, is 65 to 70%. The left ventricle has normal function. The left ventricle has no regional wall motion abnormalities. There is mild left ventricular hypertrophy. Left ventricular diastolic parameters were normal.  2. Right ventricular systolic function is normal. The right ventricular size is normal.  3. The mitral valve was not well visualized. Trivial mitral valve regurgitation.  4. The aortic valve is grossly normal. Aortic valve regurgitation is trivial. FINDINGS  Left Ventricle: Left ventricular ejection fraction, by estimation, is 65 to 70%. The left ventricle has normal function. The left ventricle has no regional wall motion abnormalities. The left ventricular internal cavity size was normal in size. There is  mild left ventricular hypertrophy. Left ventricular diastolic parameters were normal. Right Ventricle: The right ventricular size is normal. No increase in right ventricular wall thickness. Right ventricular systolic function is normal. Left Atrium: Left atrial size was normal in size. Right Atrium: Right atrial size was normal in size. Pericardium: There is no evidence of pericardial effusion. Mitral Valve: The mitral valve was not well visualized. Trivial mitral valve regurgitation. Tricuspid Valve: The tricuspid valve is not well visualized. Tricuspid valve regurgitation is mild. Aortic Valve: The aortic valve is grossly normal. Aortic valve regurgitation is trivial. Aortic valve mean gradient measures 3.0 mmHg. Aortic valve peak gradient measures 6.6 mmHg. Aortic valve area, by VTI measures 2.13 cm. Pulmonic Valve: The pulmonic valve was not well visualized. Pulmonic valve regurgitation is trivial. Aorta: The aortic root is normal in size and structure. IAS/Shunts: The  interatrial septum was not well visualized.  LEFT VENTRICLE PLAX 2D LVIDd:         4.80 cm  Diastology LVIDs:         2.63 cm  LV e' medial:    8.05 cm/s LV PW:         1.38 cm  LV E/e' medial:  11.6 LV IVS:        0.92 cm  LV e' lateral:   9.68 cm/s LVOT diam:     2.00 cm  LV E/e' lateral: 9.6 LV SV:         55 LV SV Index:   21 LVOT Area:     3.14 cm  RIGHT VENTRICLE RV Basal diam:  3.42 cm RV S prime:     13.80 cm/s TAPSE (M-mode): 3.2 cm LEFT ATRIUM             Index       RIGHT ATRIUM           Index LA diam:        3.40 cm 1.32 cm/m  RA Area:     17.70 cm LA Vol (A2C):   64.9 ml 25.22 ml/m RA Volume:   45.80 ml  17.80 ml/m LA Vol (A4C):   25.4 ml 9.87 ml/m LA Biplane Vol: 41.2 ml 16.01 ml/m  AORTIC VALVE                   PULMONIC VALVE AV Area (Vmax):    2.32 cm    PV Vmax:  0.76 m/s AV Area (Vmean):   2.45 cm    PV Peak grad:   2.3 mmHg AV Area (VTI):     2.13 cm    RVOT Peak grad: 2 mmHg AV Vmax:           128.00 cm/s AV Vmean:          83.700 cm/s AV VTI:            0.260 m AV Peak Grad:      6.6 mmHg AV Mean Grad:      3.0 mmHg LVOT Vmax:         94.60 cm/s LVOT Vmean:        65.300 cm/s LVOT VTI:          0.176 m LVOT/AV VTI ratio: 0.68  AORTA Ao Root diam: 2.63 cm MITRAL VALVE               TRICUSPID VALVE MV Area (PHT): 4.19 cm    TR Peak grad:   11.8 mmHg MV Decel Time: 181 msec    TR Vmax:        172.00 cm/s MV E velocity: 93.00 cm/s MV A velocity: 58.30 cm/s  SHUNTS MV E/A ratio:  1.60        Systemic VTI:  0.18 m                            Systemic Diam: 2.00 cm Bartholome Bill MD Electronically signed by Bartholome Bill MD Signature Date/Time: 06/12/2021/10:27:34 AM    Final      CODE STATUS:     Code Status Orders  (From admission, onward)           Start     Ordered   06/11/21 2225  Full code  Continuous        06/11/21 2224           Code Status History     This patient has a current code status but no historical code status.        TOTAL TIME TAKING CARE  OF THIS PATIENT: 35 minutes.    Fritzi Mandes M.D  [facility]    CC: Primary care physician; Lavera Guise, MD

## 2021-06-17 DIAGNOSIS — R0609 Other forms of dyspnea: Secondary | ICD-10-CM | POA: Insufficient documentation

## 2021-07-03 ENCOUNTER — Emergency Department: Payer: 59

## 2021-07-03 ENCOUNTER — Other Ambulatory Visit: Payer: Self-pay

## 2021-07-03 ENCOUNTER — Encounter: Payer: Self-pay | Admitting: Emergency Medicine

## 2021-07-03 ENCOUNTER — Observation Stay
Admission: EM | Admit: 2021-07-03 | Discharge: 2021-07-05 | Disposition: A | Discharge status: Home / Self Care | Payer: 59 | Attending: Internal Medicine | Admitting: Internal Medicine

## 2021-07-03 DIAGNOSIS — I10 Essential (primary) hypertension: Secondary | ICD-10-CM | POA: Diagnosis not present

## 2021-07-03 DIAGNOSIS — Z79899 Other long term (current) drug therapy: Secondary | ICD-10-CM | POA: Insufficient documentation

## 2021-07-03 DIAGNOSIS — R2689 Other abnormalities of gait and mobility: Secondary | ICD-10-CM | POA: Diagnosis not present

## 2021-07-03 DIAGNOSIS — Z87891 Personal history of nicotine dependence: Secondary | ICD-10-CM | POA: Diagnosis not present

## 2021-07-03 DIAGNOSIS — R0602 Shortness of breath: Secondary | ICD-10-CM | POA: Insufficient documentation

## 2021-07-03 DIAGNOSIS — I731 Thromboangiitis obliterans [Buerger's disease]: Secondary | ICD-10-CM | POA: Diagnosis not present

## 2021-07-03 DIAGNOSIS — E039 Hypothyroidism, unspecified: Secondary | ICD-10-CM | POA: Diagnosis not present

## 2021-07-03 DIAGNOSIS — Z7982 Long term (current) use of aspirin: Secondary | ICD-10-CM | POA: Diagnosis not present

## 2021-07-03 DIAGNOSIS — I4729 Other ventricular tachycardia: Secondary | ICD-10-CM

## 2021-07-03 DIAGNOSIS — E66813 Obesity, class 3: Secondary | ICD-10-CM

## 2021-07-03 DIAGNOSIS — R002 Palpitations: Secondary | ICD-10-CM | POA: Diagnosis not present

## 2021-07-03 DIAGNOSIS — Z20822 Contact with and (suspected) exposure to covid-19: Secondary | ICD-10-CM | POA: Insufficient documentation

## 2021-07-03 DIAGNOSIS — R0902 Hypoxemia: Secondary | ICD-10-CM | POA: Insufficient documentation

## 2021-07-03 DIAGNOSIS — E876 Hypokalemia: Secondary | ICD-10-CM

## 2021-07-03 DIAGNOSIS — R079 Chest pain, unspecified: Secondary | ICD-10-CM | POA: Diagnosis present

## 2021-07-03 DIAGNOSIS — I472 Ventricular tachycardia: Principal | ICD-10-CM | POA: Insufficient documentation

## 2021-07-03 DIAGNOSIS — K219 Gastro-esophageal reflux disease without esophagitis: Secondary | ICD-10-CM | POA: Diagnosis present

## 2021-07-03 LAB — BASIC METABOLIC PANEL
Anion gap: 12 (ref 5–15)
BUN: 14 mg/dL (ref 6–20)
CO2: 22 mmol/L (ref 22–32)
Calcium: 9.3 mg/dL (ref 8.9–10.3)
Chloride: 105 mmol/L (ref 98–111)
Creatinine, Ser: 0.68 mg/dL (ref 0.44–1.00)
GFR, Estimated: >60 mL/min (ref >60)
Glucose, Bld: 108 mg/dL — ABNORMAL HIGH (ref 70–99)
Potassium: 3.8 mmol/L (ref 3.5–5.1)
Sodium: 139 mmol/L (ref 135–145)

## 2021-07-03 LAB — TSH: TSH: 5.135 u[IU]/mL — ABNORMAL HIGH (ref 0.350–4.500)

## 2021-07-03 LAB — CBC
HCT: 43.5 % (ref 36.0–46.0)
Hemoglobin: 14.8 g/dL (ref 12.0–15.0)
MCH: 31.8 pg (ref 26.0–34.0)
MCHC: 34 g/dL (ref 30.0–36.0)
MCV: 93.3 fL (ref 80.0–100.0)
Platelets: 267 K/uL (ref 150–400)
RBC: 4.66 MIL/uL (ref 3.87–5.11)
RDW: 14.5 % (ref 11.5–15.5)
WBC: 8.7 K/uL (ref 4.0–10.5)
nRBC: 0 % (ref 0.0–0.2)

## 2021-07-03 LAB — MAGNESIUM: Magnesium: 2 mg/dL (ref 1.7–2.4)

## 2021-07-03 LAB — TROPONIN I (HIGH SENSITIVITY)
Troponin I (High Sensitivity): 6 ng/L (ref <18)
Troponin I (High Sensitivity): 7 ng/L (ref <18)

## 2021-07-03 LAB — POC URINE PREG, ED: Preg Test, Ur: NEGATIVE

## 2021-07-03 MED ORDER — CILOSTAZOL 100 MG PO TABS
50.0000 mg | ORAL_TABLET | Freq: Two times a day (BID) | ORAL | Status: DC
  Administered 2021-07-03: 50 mg via ORAL
  Filled 2021-07-03 (×5): qty 0.5

## 2021-07-03 MED ORDER — CRANBERRY 1000 MG PO CAPS: 1000.0000 mg | ORAL_CAPSULE | Freq: Every day | ORAL | Status: DC

## 2021-07-03 MED ORDER — METOPROLOL TARTRATE 25 MG PO TABS
25.0000 mg | ORAL_TABLET | Freq: Once | ORAL | Status: AC
  Administered 2021-07-03: 25 mg via ORAL
  Filled 2021-07-03: qty 1

## 2021-07-03 MED ORDER — ONDANSETRON HCL 4 MG/2ML IJ SOLN
4.0000 mg | Freq: Four times a day (QID) | INTRAMUSCULAR | Status: DC | PRN
  Administered 2021-07-04: 4 mg via INTRAVENOUS
  Filled 2021-07-03: qty 2

## 2021-07-03 MED ORDER — METOPROLOL TARTRATE 25 MG PO TABS
25.0000 mg | ORAL_TABLET | Freq: Two times a day (BID) | ORAL | Status: DC
  Filled 2021-07-03: qty 1

## 2021-07-03 MED ORDER — ASPIRIN EC 81 MG PO TBEC
81.0000 mg | DELAYED_RELEASE_TABLET | Freq: Every day | ORAL | Status: DC
  Administered 2021-07-03 – 2021-07-05 (×3): 81 mg via ORAL
  Filled 2021-07-03 (×3): qty 1

## 2021-07-03 MED ORDER — AMLODIPINE BESYLATE 5 MG PO TABS
10.0000 mg | ORAL_TABLET | Freq: Every day | ORAL | Status: DC
  Administered 2021-07-03: 10 mg via ORAL
  Filled 2021-07-03: qty 2

## 2021-07-03 MED ORDER — PANTOPRAZOLE SODIUM 40 MG PO TBEC
40.0000 mg | DELAYED_RELEASE_TABLET | Freq: Two times a day (BID) | ORAL | Status: DC
Start: 2021-07-03 — End: 2021-07-05
  Administered 2021-07-03 – 2021-07-05 (×4): 40 mg via ORAL
  Filled 2021-07-03 (×4): qty 1

## 2021-07-03 MED ORDER — CLOTRIMAZOLE-BETAMETHASONE 1-0.05 % EX CREA
1.0000 "application " | TOPICAL_CREAM | Freq: Two times a day (BID) | CUTANEOUS | Status: DC | PRN
  Filled 2021-07-03: qty 15

## 2021-07-03 MED ORDER — ACETAMINOPHEN 325 MG PO TABS
650.0000 mg | ORAL_TABLET | ORAL | Status: DC | PRN
  Administered 2021-07-03: 650 mg via ORAL
  Filled 2021-07-03: qty 2

## 2021-07-03 MED ORDER — POTASSIUM CHLORIDE CRYS ER 20 MEQ PO TBCR
40.0000 meq | EXTENDED_RELEASE_TABLET | Freq: Once | ORAL | Status: AC
  Administered 2021-07-03: 40 meq via ORAL
  Filled 2021-07-03: qty 2

## 2021-07-03 MED ORDER — LEVOTHYROXINE SODIUM 50 MCG PO TABS
150.0000 ug | ORAL_TABLET | Freq: Every day | ORAL | Status: DC
  Administered 2021-07-04 – 2021-07-05 (×2): 150 ug via ORAL
  Filled 2021-07-03 (×2): qty 1

## 2021-07-03 MED ORDER — ENOXAPARIN SODIUM 80 MG/0.8ML IJ SOSY
80.0000 mg | PREFILLED_SYRINGE | INTRAMUSCULAR | Status: DC
  Administered 2021-07-04 (×2): 80 mg via SUBCUTANEOUS
  Filled 2021-07-03 (×2): qty 0.8

## 2021-07-03 MED ORDER — ENOXAPARIN SODIUM 40 MG/0.4ML IJ SOSY: 40.0000 mg | PREFILLED_SYRINGE | INTRAMUSCULAR | Status: DC

## 2021-07-03 MED ORDER — VITAMIN D (ERGOCALCIFEROL) 1.25 MG (50000 UNIT) PO CAPS: 50000.0000 [IU] | ORAL_CAPSULE | ORAL | Status: DC

## 2021-07-03 MED ORDER — RISAQUAD PO CAPS
1.0000 | ORAL_CAPSULE | Freq: Every day | ORAL | Status: DC
  Administered 2021-07-03: 1 via ORAL
  Filled 2021-07-03: qty 1

## 2021-07-03 MED ORDER — CLOTRIMAZOLE-BETAMETHASONE 1-0.05 % EX CREA: 1.0000 "application " | TOPICAL_CREAM | Freq: Two times a day (BID) | CUTANEOUS | Status: DC

## 2021-07-03 MED ORDER — FOLIC ACID 1 MG PO TABS
1.0000 mg | ORAL_TABLET | Freq: Every day | ORAL | Status: DC
  Administered 2021-07-03 – 2021-07-04 (×2): 1 mg via ORAL
  Filled 2021-07-03 (×2): qty 1

## 2021-07-03 MED ORDER — IOHEXOL 350 MG/ML SOLN
100.0000 mL | Freq: Once | INTRAVENOUS | Status: AC | PRN
  Administered 2021-07-03: 100 mL via INTRAVENOUS

## 2021-07-03 NOTE — H&P (Signed)
History and Physical    [name] [id number] DOB: 18-Aug-1966 DOA: 07/03/2021  PCP: Lavera Guise, MD   Patient coming from: Home  I have personally briefly reviewed patient's old medical records in [place]  Chief Complaint: Palpitations  HPI: [name] is a 55 y.o. female with medical history significant for morbid obesity (BMI 38), Buerger's disease, hypertension, hypothyroidism, obstructive sleep apnea who presents to the emergency room for evaluation of palpitations.  Patient describes a sensation of feeling her heart racing and also describes feeling some extra beats.  Some of these episodes have been associated with dizziness and lightheadedness but she denies having any falls or loss of consciousness. She also complains of left-sided chest pain as well as pain involving the left neck, left scapula and left arm.  Pain is intermittent and not related to rest or exertion.  She also complains of some shortness of breath but denies having any nausea, no vomiting, no diaphoresis. She was recently hospitalized about 3 weeks ago and at that time was found to have elevated troponin suggestive of cardiac injury.  She had a normal cardiac cath and was treated for presumed viral myocarditis.  She was discharged home on a 2-week course of colchicine which she has completed. She denies having any abdominal pain, no changes in her bowel habits, no fever, no chills, no cough, no urinary frequency, no nocturia, no dysuria, no headache, no blurred vision no focal deficits. Labs show sodium 139, potassium 3.8, chloride 105, bicarb 22, glucose 108, BUN 14, creatinine 0.68, calcium 9.3, troponin 6 >> 7, white count 8.7, hemoglobin 14.8, hematocrit 43.5, MCV 93.3, RDW 14.5, platelet count 267, TSH 5.135 Chest x-ray reviewed by me shows no evidence of acute cardiopulmonary disease CT angiogram of the chest is negative for pulmonary embolism or thoracic aortic dissection. Respiratory viral  panel is pending Twelve-lead EKG reviewed by me shows normal sinus rhythm.    ED Course: Patient is a 55 year old female who presents to the ER for evaluation of palpitations as well as left-sided chest pain, left-sided neck/scapular pain as well as left arm pain. While on the monitor in the ER she was noted to have episodes of nonsustained ventricular tachycardia. She received a dose of metoprolol in the ER and request was made by ER physician to monitor patient overnight.     Review of Systems: As per HPI otherwise all other systems reviewed and negative.    Past Medical History:  Diagnosis Date   Berger's disease 2011   Buerger's disease ([place])    Family history of adverse reaction to anesthesia    sister and daughter difficult to wake up   Folic acid deficiency    GERD (gastroesophageal reflux disease)    Hypertension    Hypothyroidism    IDA (iron deficiency anemia)    Osteoarthritis    Thrombocytosis     Past Surgical History:  Procedure Laterality Date   ADENOIDECTOMY     Small child   APPENDECTOMY     Age 87   COLON SURGERY  1980   ileostomy and internal pouch age 53   CORONARY/GRAFT ACUTE MI REVASCULARIZATION N/A 06/12/2021   Procedure: Coronary/Graft Acute MI Revascularization;  Surgeon: Isaias Cowman, MD;  Location: [facility];  Service: Cardiovascular;  Laterality: N/A;   DILATATION & CURETTAGE/HYSTEROSCOPY WITH MYOSURE N/A 05/02/2015   Procedure: DILATATION & CURETTAGE/HYSTEROSCOPY WITH MYOSURE;  Surgeon: Malachy Mood, MD;  Location: [facility];  Service: Gynecology;  Laterality: N/A;  LAPAROSCOPIC GASTRIC SLEEVE RESECTION  07/06/2016   LEFT HEART CATH AND CORONARY ANGIOGRAPHY N/A 06/12/2021   Procedure: LEFT HEART CATH AND CORONARY ANGIOGRAPHY;  Surgeon: Isaias Cowman, MD;  Location: [facility];  Service: Cardiovascular;  Laterality: N/A;     reports that she quit smoking about 11 years ago. Her smoking use included  cigarettes. She has never used smokeless tobacco. She reports that she does not drink alcohol and does not use drugs.  Allergies  Allergen Reactions   Yeast-Related Products    Tape Rash    Some tapes cause a rash, possible the paper tape.    Family History  Problem Relation Age of Onset   Hypertension Mother    Congestive Heart Failure Father    Hypertension Father    Stroke Sister    Skin cancer Maternal Aunt        Basal cell   Skin cancer Maternal Uncle        Basal cell   Congestive Heart Failure Maternal Grandmother    Colon cancer Maternal Grandfather 85   Skin cancer Cousin        Basal Cell      Prior to Admission medications   Medication Sig Start Date End Date Taking? Authorizing Provider  amLODipine (NORVASC) 10 MG tablet TAKE ONE TABLET BY MOUTH EVERY NIGHT AT BEDTIME 01/10/21  Yes Lavera Guise, MD  aspirin (ASPIR-LOW) 81 MG EC tablet Take 81 mg by mouth daily. Swallow whole.   Yes [provider]  cilostazol (PLETAL) 50 MG tablet Take 50 mg by mouth 2 (two) times daily. 08/07/20 08/07/21 Yes [provider]  Cranberry 1000 MG CAPS Take 1 tablet by mouth daily.   Yes [provider]  famotidine (PEPCID) 20 MG tablet TAKE ONE TABLET BY MOUTH TWICE A DAY 11/24/20  Yes Lavera Guise, MD  folic acid (FOLVITE) 1 MG tablet Take 1 tablet (1 mg total) by mouth daily. 05/14/21  Yes Lavera Guise, MD  levothyroxine (SYNTHROID) 150 MCG tablet Take 1 tablet (150 mcg total) by mouth daily before breakfast. 04/29/21  Yes Lavera Guise, MD  pantoprazole (PROTONIX) 40 MG tablet Take 1 tablet (40 mg total) by mouth 2 (two) times daily. 07/28/20  Yes Lavera Guise, MD  Pediatric Multiple Vit-C-FA (FLINSTONES GUMMIES OMEGA-3 DHA) CHEW Chew 2 tablets by mouth daily.   Yes [provider]  Probiotic Product (PROBIOTIC ADVANCED PO) Take 1 capsule by mouth daily.   Yes [provider]  Vitamin D, Ergocalciferol, (DRISDOL) 1.25 MG (50000 UNIT) CAPS  capsule TAKE 1 CAPSULE ONCE A WEEK 05/14/21  Yes Lavera Guise, MD  aspirin EC 325 MG EC tablet Take 1 tablet (325 mg total) by mouth daily. Patient not taking: No sig reported 06/13/21   Fritzi Mandes, MD  clotrimazole-betamethasone (LOTRISONE) cream Apply 1 application topically 2 (two) times daily. 06/03/21   Rod Can, CNM  colchicine 0.6 MG tablet Take 1 tablet (0.6 mg total) by mouth 2 (two) times daily for 14 days. 06/13/21 06/27/21  Fritzi Mandes, MD  levonorgestrel (MIRENA) 20 MCG/24HR IUD by Intrauterine route.    [provider]    Physical Exam: Vitals:   07/03/21 1130 07/03/21 1200 07/03/21 1230 07/03/21 1300  BP: 124/69 129/66 121/70 117/76  Pulse: 88 82 90 80  Resp: '20 16 20 15  '$ Temp:      TempSrc:      SpO2: 100% 98% 97% 97%  Weight:  Height:         Vitals:   07/03/21 1130 07/03/21 1200 07/03/21 1230 07/03/21 1300  BP: 124/69 129/66 121/70 117/76  Pulse: 88 82 90 80  Resp: '20 16 20 15  '$ Temp:      TempSrc:      SpO2: 100% 98% 97% 97%  Weight:      Height:          Constitutional: Alert and oriented x 3 . Not in any apparent distress.  Morbidly obese.  Appears comfortable and in no obvious distress. HEENT:      Head: Normocephalic and atraumatic.         Eyes: PERLA, EOMI, Conjunctivae are normal. Sclera is non-icteric.       Mouth/Throat: Mucous membranes are moist.       Neck: Supple with no signs of meningismus. Cardiovascular: Regular rate and rhythm. No murmurs, gallops, or rubs. 2+ symmetrical distal pulses are present . No JVD. No LE edema Respiratory: Respiratory effort normal .Lungs sounds clear bilaterally. No wheezes, crackles, or rhonchi.  Gastrointestinal: Soft, non tender, and non distended with positive bowel sounds.  Central adiposity Genitourinary: No CVA tenderness. Musculoskeletal: Nontender with normal range of motion in all extremities. No cyanosis, or erythema of extremities. Neurologic:  Face is symmetric. Moving all  extremities. No gross focal neurologic deficits . Skin: Skin is warm, dry.  No rash or ulcers Psychiatric: Mood and affect are normal    Labs on Admission: I have personally reviewed following labs and imaging studies  CBC: Recent Labs  Lab 07/03/21 0840  WBC 8.7  HGB 14.8  HCT 43.5  MCV 93.3  PLT 99991111   Basic Metabolic Panel: Recent Labs  Lab 07/03/21 0840  NA 139  K 3.8  CL 105  CO2 22  GLUCOSE 108*  BUN 14  CREATININE 0.68  CALCIUM 9.3   GFR: Estimated Creatinine Clearance: 131.2 mL/min (by C-G formula based on SCr of 0.68 mg/dL). Liver Function Tests: No results for input(s): AST, ALT, ALKPHOS, BILITOT, PROT, ALBUMIN in the last 168 hours. No results for input(s): LIPASE, AMYLASE in the last 168 hours. No results for input(s): AMMONIA in the last 168 hours. Coagulation Profile: No results for input(s): INR, PROTIME in the last 168 hours. Cardiac Enzymes: No results for input(s): CKTOTAL, CKMB, CKMBINDEX, TROPONINI in the last 168 hours. BNP (last 3 results) No results for input(s): PROBNP in the last 8760 hours. HbA1C: No results for input(s): HGBA1C in the last 72 hours. CBG: No results for input(s): GLUCAP in the last 168 hours. Lipid Profile: No results for input(s): CHOL, HDL, LDLCALC, TRIG, CHOLHDL, LDLDIRECT in the last 72 hours. Thyroid Function Tests: Recent Labs    07/03/21 1155  TSH 5.135*   Anemia Panel: No results for input(s): VITAMINB12, FOLATE, FERRITIN, TIBC, IRON, RETICCTPCT in the last 72 hours. Urine analysis:    Component Value Date/Time   COLORURINE YELLOW (A) 10/16/2019 1302   APPEARANCEUR Clear 08/11/2020 1620   LABSPEC 1.013 10/16/2019 1302   PHURINE 5.0 10/16/2019 1302   GLUCOSEU Negative 08/11/2020 1620   HGBUR NEGATIVE 10/16/2019 1302   BILIRUBINUR neg 06/03/2021 1540   BILIRUBINUR Negative 08/11/2020 1620   KETONESUR NEGATIVE 10/16/2019 1302   PROTEINUR Negative 06/03/2021 1540   PROTEINUR Negative 08/11/2020 1620    PROTEINUR NEGATIVE 10/16/2019 1302   UROBILINOGEN 0.2 06/03/2021 1540   NITRITE neg 06/03/2021 1540   NITRITE Negative 08/11/2020 1620   NITRITE NEGATIVE 10/16/2019 1302   LEUKOCYTESUR Small (  1+) (A) 06/03/2021 1540   LEUKOCYTESUR Trace (A) 08/11/2020 1620   LEUKOCYTESUR SMALL (A) 10/16/2019 1302    Radiological Exams on Admission: DG Chest 2 View  Result Date: 07/03/2021 CLINICAL DATA:  Chest pain and palpitations. EXAM: CHEST - 2 VIEW COMPARISON:  CTA chest and chest x-ray dated June 11, 2021. FINDINGS: Stable cardiomediastinal silhouette with borderline cardiomegaly. Normal pulmonary vascularity. No focal consolidation, pleural effusion, or pneumothorax. No acute osseous abnormality. IMPRESSION: No active cardiopulmonary disease. Electronically Signed   By: Titus Dubin M.D.   On: 07/03/2021 09:31   CT Angio Chest PE W and/or Wo Contrast  Result Date: 07/03/2021 CLINICAL DATA:  Left neck and shoulder pain, anxiety, high prob suspicion PE EXAM: CT ANGIOGRAPHY CHEST WITH CONTRAST TECHNIQUE: Multidetector CT imaging of the chest was performed using the standard protocol during bolus administration of intravenous contrast. Multiplanar CT image reconstructions and MIPs were obtained to evaluate the vascular anatomy. CONTRAST:  115m OMNIPAQUE IOHEXOL 350 MG/ML SOLN COMPARISON:  06/11/2021 and previous FINDINGS: Cardiovascular: Heart size upper limits normal. No pericardial effusion. The RV is nondilated. Satisfactory opacification of pulmonary arteries noted, and there is no evidence of pulmonary emboli. Adequate contrast opacification of the thoracic aorta with no evidence of dissection, aneurysm, or stenosis. There is classic 3-vessel brachiocephalic arch anatomy without proximal stenosis. No significant atheromatous change. Mediastinum/Nodes: No mass or adenopathy. Lungs/Pleura: No pleural effusion. No pneumothorax. Pulmonary hypoinflation without confluent airspace disease or nodule. Upper  Abdomen: Surgical staples along the greater curvature of the stomach. No acute findings. Musculoskeletal: Upper thoracic levoscoliosis without underlying vertebral anomaly. No fracture or worrisome bone lesion. Review of the MIP images confirms the above findings. IMPRESSION: 1. Negative.  No acute PE or thoracic aortic dissection. Electronically Signed   By: DLucrezia EuropeM.D.   On: 07/03/2021 13:50     Assessment/Plan Principal Problem:   Heart palpitations Active Problems:   Morbid obesity with BMI of 50.0-59.9, adult (HCC)   Hypertension   GERD (gastroesophageal reflux disease)   Buerger's disease (HCC)   Decreased functional mobility    Heart palpitations/PVCs/nonsustained ventricular tachycardia Patient is symptomatic and complains of feeling dizzy and lightheaded associated with these palpitations. She was seen by her cardiologist and is currently on a Holter monitor Will start patient on low-dose beta-blocker and uptitrate as much as tolerated Supplement electrolytes, magnesium and potassium Will consult cardiology    Hypertension Continue amlodipine   Morbid obesity (BMI 55)/decreased functional mobility Complicates overall prognosis and care Lifestyle modification and exercise has been discussed with patient in detail    Hypothyroidism Continue Synthroid    History of Buerger's disease Continue aspirin and Pletal   DVT prophylaxis: Lovenox  Code Status: full code  Family Communication: Greater than 50% of time was spent discussing patient's condition and plan of care with her and her daughter at the bedside.  All questions and concerns have been addressed.  She verbalizes understanding and agrees with the plan. Disposition Plan: Back to previous home environment Consults called: Cardiology Status: Observation    Venice Liz MD [facility]     07/03/2021, 2:29 PM

## 2021-07-03 NOTE — ED Notes (Addendum)
Clabe Seal pac stated to give patient her metoprolol tartrate thru epic messenger

## 2021-07-03 NOTE — ED Notes (Signed)
Cardiologist at bedside.  

## 2021-07-03 NOTE — ED Notes (Signed)
pts last blood pressure was 99/59, ed rn messaged provider

## 2021-07-03 NOTE — ED Notes (Signed)
Patient informed me that the left neck/shoulder pain is feeling worse.  She is anxious appearing again.  She had said that t he cardiologist thought that her troponin elevation last time could be related to arrhythmia.  I have placed her on t he monitor in the lobby across from me.  Her rate is in the 80s and regular, but she is having frequent pvcs.  Gave her warm blankets and reassured her and she appears calmer.  Explained that we will be doing another troponin in about an hour.

## 2021-07-03 NOTE — ED Provider Notes (Signed)
[facility] Emergency Department Provider Note  ____________________________________________   Event Date/Time   First MD Initiated Contact with Patient 07/03/21 1038     (approximate)  I have reviewed the triage vital signs and the nursing notes.   HISTORY  Chief Complaint Chest Pain    HPI Joanne SEIFFERT is a 55 y.o. female with complicated recent history including recent NSTEMI despite normal coronaries here with palpitations and intermittent tachycardia.  The patient states that over the last year, she has had essentially ongoing, intermittent chest pain and palpitations.  She was actually just hospitalized for NSTEMI with significant troponin elevation, but had normal coronaries.  She is currently seeing a cardiologist at [facility].  He suspects it could be a component of vasospasm versus underlying vascular disease.  Patient states that over the last several days, she has had increasingly frequent episodes of palpitations and intermittent shortness of breath.  Denies overt chest pain with this.  She has felt fatigued.  The symptoms seem to be getting increasingly severe and symptomatic.  She is wearing a cardiac monitor but is unsure what the readings have read.  She has been noting these events, however, and states the cardiologist not called her.  No fevers or chills.  No recent medication changes.  No other issues.    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   Encounter for general adult medical examination with abnormal findings 08/13/2020   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   Obstructive sleep apnea 08/13/2020   Chest pain 08/13/2020   Hypothyroidism due to Hashimoto's thyroiditis 08/13/2020   Gastroesophageal reflux disease 07/21/2020   Elevated amylase 04/06/2020   Generalized abdominal pain 03/23/2020   S/P total colectomy 03/23/2020   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   Vitamin D deficiency 06/06/2019   Peripheral vascular disease ([place]) 10/11/2018   Shortness of breath 06/07/2018   Arthralgia of both knees 06/07/2018   Decreased functional mobility 06/07/2018   Morbid obesity with BMI of 50.0-59.9, adult ([place]) 06/04/2013   GERD (gastroesophageal reflux disease) 06/04/2013   Buerger's disease ([place]) 06/04/2013   Sleep apnea 06/04/2013   Hypothyroid 05/11/2013   Hypertension 05/11/2013    Past Surgical History:  Procedure Laterality Date   ADENOIDECTOMY     Small child   APPENDECTOMY     Age 52   COLON SURGERY  1980   ileostomy and internal pouch age 49   CORONARY/GRAFT ACUTE MI REVASCULARIZATION N/A 06/12/2021   Procedure: Coronary/Graft Acute MI Revascularization;  Surgeon: Isaias Cowman, MD;  Location: [place] [facility];  Service: Cardiovascular;  Laterality: N/A;   DILATATION & CURETTAGE/HYSTEROSCOPY WITH MYOSURE N/A 05/02/2015   Procedure: DILATATION & CURETTAGE/HYSTEROSCOPY WITH MYOSURE;  Surgeon: Malachy Mood, MD;  Location: [facility];  Service: Gynecology;  Laterality: N/A;  LAPAROSCOPIC GASTRIC SLEEVE RESECTION  07/06/2016   LEFT HEART CATH AND CORONARY ANGIOGRAPHY N/A 06/12/2021   Procedure: LEFT HEART CATH AND CORONARY ANGIOGRAPHY;  Surgeon: Isaias Cowman, MD;  Location: [place] [facility];  Service: Cardiovascular;  Laterality: N/A;     Prior to Admission medications   Medication Sig Start Date End Date Taking? Authorizing Provider  amLODipine (NORVASC) 10 MG tablet TAKE ONE TABLET BY MOUTH EVERY NIGHT AT BEDTIME 01/10/21  Yes Lavera Guise, MD  aspirin (ASPIR-LOW) 81 MG EC tablet Take 81 mg by mouth daily. Swallow whole.   Yes [provider]  cilostazol (PLETAL) 50 MG tablet Take 50 mg by mouth 2 (two) times daily. 08/07/20 08/07/21 Yes [provider]  Cranberry 1000 MG CAPS Take 1 tablet by mouth daily.   Yes [provider]  famotidine (PEPCID) 20 MG tablet TAKE ONE TABLET BY MOUTH TWICE A DAY 11/24/20  Yes Lavera Guise, MD  folic acid (FOLVITE) 1 MG tablet Take 1 tablet (1 mg total) by mouth daily. 05/14/21  Yes Lavera Guise, MD  levothyroxine (SYNTHROID) 150 MCG tablet Take 1 tablet (150 mcg total) by mouth daily before breakfast. 04/29/21  Yes Lavera Guise, MD  pantoprazole (PROTONIX) 40 MG tablet Take 1 tablet (40 mg total) by mouth 2 (two) times daily. 07/28/20  Yes Lavera Guise, MD  Pediatric Multiple Vit-C-FA (FLINSTONES GUMMIES OMEGA-3 DHA) CHEW Chew 2 tablets by mouth daily.   Yes [provider]  Probiotic Product (PROBIOTIC ADVANCED PO) Take 1 capsule by mouth daily.   Yes [provider]  Vitamin D, Ergocalciferol, (DRISDOL) 1.25 MG (50000 UNIT) CAPS capsule TAKE 1 CAPSULE ONCE A WEEK 05/14/21  Yes Lavera Guise, MD  aspirin EC 325 MG EC tablet Take 1 tablet (325 mg total) by mouth daily. Patient not taking: No sig reported 06/13/21   Fritzi Mandes, MD  clotrimazole-betamethasone (LOTRISONE) cream Apply 1 application topically 2 (two) times daily. 06/03/21   Rod Can, CNM  colchicine 0.6 MG tablet Take 1 tablet (0.6 mg total) by mouth 2 (two) times daily for 14 days. 06/13/21 06/27/21  Fritzi Mandes, MD  levonorgestrel (MIRENA) 20 MCG/24HR IUD by Intrauterine route.    [provider]    Allergies Yeast-related products and Tape  Family History  Problem  Relation Age of Onset   Hypertension Mother    Congestive Heart Failure Father    Hypertension Father    Stroke Sister    Skin cancer Maternal Aunt        Basal cell   Skin cancer Maternal Uncle        Basal cell   Congestive Heart Failure Maternal Grandmother    Colon cancer Maternal Grandfather 85   Skin cancer Cousin        Basal Cell    Social History Social History   Tobacco Use   Smoking status: Former    Types: Cigarettes    Quit date: 04/28/2010    Years since quitting: 11.1   Smokeless tobacco: Never  Substance Use Topics   Alcohol use: No    Alcohol/week: 0.0 standard drinks   Drug use: No    Review of Systems  Review of Systems  Constitutional:  Positive for fatigue. Negative for fever.  HENT:  Negative for congestion and sore throat.   Eyes:  Negative for visual disturbance.  Respiratory:  Positive for chest tightness. Negative for cough and shortness of breath.   Cardiovascular:  Positive for  palpitations. Negative for chest pain.  Gastrointestinal:  Negative for abdominal pain, diarrhea, nausea and vomiting.  Genitourinary:  Negative for flank pain.  Musculoskeletal:  Negative for back pain and neck pain.  Skin:  Negative for rash and wound.  Neurological:  Negative for weakness.  All other systems reviewed and are negative.   ____________________________________________  PHYSICAL EXAM:      VITAL SIGNS: ED Triage Vitals  Enc Vitals Group     BP 07/03/21 0842 140/62     Pulse Rate 07/03/21 0842 96     Resp 07/03/21 0842 18     Temp 07/03/21 0842 98.2 F (36.8 C)     Temp Source 07/03/21 0842 Oral     SpO2 07/03/21 0842 99 %     Weight 07/03/21 0831 (!) 352 lb 11.8 oz (160 kg)     Height 07/03/21 0831 '5\' 7"'$  (1.702 m)     Head Circumference --      Peak Flow --      Pain Score 07/03/21 0831 4     Pain Loc --      Pain Edu? --      Excl. in [place]? --      Physical Exam Vitals and nursing note reviewed.  Constitutional:      General: She  is not in acute distress.    Appearance: She is well-developed.  HENT:     Head: Normocephalic and atraumatic.  Eyes:     Conjunctiva/sclera: Conjunctivae normal.  Cardiovascular:     Rate and Rhythm: Normal rate. Rhythm irregular.     Heart sounds: Normal heart sounds. No murmur heard.   No friction rub.     Comments: Frequent PVCs. Pulmonary:     Effort: Pulmonary effort is normal. No respiratory distress.     Breath sounds: Normal breath sounds. No wheezing or rales.  Abdominal:     General: There is no distension.     Palpations: Abdomen is soft.     Tenderness: There is no abdominal tenderness.  Musculoskeletal:     Cervical back: Neck supple.  Skin:    General: Skin is warm.     Capillary Refill: Capillary refill takes less than 2 seconds.  Neurological:     Mental Status: She is alert and oriented to person, place, and time.     Motor: No abnormal muscle tone.      ____________________________________________   LABS (all labs ordered are listed, but only abnormal results are displayed)  Labs Reviewed  BASIC METABOLIC PANEL - Abnormal; Notable for the following components:      Result Value   Glucose, Bld 108 (*)    All other components within normal limits  TSH - Abnormal; Notable for the following components:   TSH 5.135 (*)    All other components within normal limits  POC URINE PREG, ED - Normal  CBC  MAGNESIUM  TROPONIN I (HIGH SENSITIVITY)  TROPONIN I (HIGH SENSITIVITY)    ____________________________________________  EKG: Normal sinus rhythm, ventricular 99.  PR 118, QRS 84, QTc 464.  No acute ST elevations or depressions.  No acute evidence of acute ischemia or infarct. ________________________________________  RADIOLOGY All imaging, including plain films, CT scans, and ultrasounds, independently reviewed by me, and interpretations confirmed via formal radiology reads.  ED MD interpretation:   Chest x-ray: No acute disease  Official radiology  report(s): DG Chest 2 View  Result Date: 07/03/2021 CLINICAL DATA:  Chest pain and palpitations. EXAM: CHEST - 2 VIEW  COMPARISON:  CTA chest and chest x-ray dated June 11, 2021. FINDINGS: Stable cardiomediastinal silhouette with borderline cardiomegaly. Normal pulmonary vascularity. No focal consolidation, pleural effusion, or pneumothorax. No acute osseous abnormality. IMPRESSION: No active cardiopulmonary disease. Electronically Signed   By: Titus Dubin M.D.   On: 07/03/2021 09:31   CT Angio Chest PE W and/or Wo Contrast  Result Date: 07/03/2021 CLINICAL DATA:  Left neck and shoulder pain, anxiety, high prob suspicion PE EXAM: CT ANGIOGRAPHY CHEST WITH CONTRAST TECHNIQUE: Multidetector CT imaging of the chest was performed using the standard protocol during bolus administration of intravenous contrast. Multiplanar CT image reconstructions and MIPs were obtained to evaluate the vascular anatomy. CONTRAST:  182m OMNIPAQUE IOHEXOL 350 MG/ML SOLN COMPARISON:  06/11/2021 and previous FINDINGS: Cardiovascular: Heart size upper limits normal. No pericardial effusion. The RV is nondilated. Satisfactory opacification of pulmonary arteries noted, and there is no evidence of pulmonary emboli. Adequate contrast opacification of the thoracic aorta with no evidence of dissection, aneurysm, or stenosis. There is classic 3-vessel brachiocephalic arch anatomy without proximal stenosis. No significant atheromatous change. Mediastinum/Nodes: No mass or adenopathy. Lungs/Pleura: No pleural effusion. No pneumothorax. Pulmonary hypoinflation without confluent airspace disease or nodule. Upper Abdomen: Surgical staples along the greater curvature of the stomach. No acute findings. Musculoskeletal: Upper thoracic levoscoliosis without underlying vertebral anomaly. No fracture or worrisome bone lesion. Review of the MIP images confirms the above findings. IMPRESSION: 1. Negative.  No acute PE or thoracic aortic dissection.  Electronically Signed   By: DLucrezia EuropeM.D.   On: 07/03/2021 13:50    ____________________________________________  PROCEDURES   Procedure(s) performed (including Critical Care):  Procedures  ____________________________________________  INITIAL IMPRESSION / MDM / [place]/ ED COURSE  As part of my medical decision making, I reviewed the following data within the e[place]notes reviewed and incorporated, Old chart reviewed, Notes from prior ED visits, and [place] Controlled Substance Database       *LLANNETTE SARASINwas evaluated in Emergency Department on 07/03/2021 for the symptoms described in the history of present illness. She was evaluated in the context of the global COVID-19 pandemic, which necessitated consideration that the patient might be at risk for infection with the SARS-CoV-2 virus that causes COVID-19. Institutional protocols and algorithms that pertain to the evaluation of patients at risk for COVID-19 are in a state of rapid change based on information released by regulatory bodies including the CDC and federal and state organizations. These policies and algorithms were followed during the patient's care in the ED.  Some ED evaluations and interventions may be delayed as a result of limited staffing during the pandemic.*     Medical Decision Making: 55year old female here with intermittent palpitations and shortness of breath.  Patient was recently hospitalized for NSTEMI, interestingly with normal coronaries on cath.  EKG here shows PVCs but otherwise is nonischemic.  Troponins are negative which is reassuring.  In the ED, patient noted to have fairly frequent PVCs and ectopy, with multiple runs of 3-4 beat nonsustained ventricular tachycardia.  No evidence of atrial fibrillation noted, but given her symptoms of lightheadedness and palpitations, feel it is best to monitor her for observation and further arrhythmia.  I discussed the case with  Dr. PSaralyn Pilar  He recommends starting the patient on a beta-blocker.  She was prescribed this at discharge, but this is not currently on her medication list.   ____________________________________________  FINAL CLINICAL IMPRESSION(S) / ED DIAGNOSES  Final diagnoses:  Nonsustained ventricular tachycardia (HCC)     MEDICATIONS GIVEN DURING THIS VISIT:  Medications  metoprolol tartrate (LOPRESSOR) tablet 25 mg (25 mg Oral Not Given 07/03/21 1509)  aspirin EC tablet 81 mg (81 mg Oral Given 07/03/21 1526)  amLODipine (NORVASC) tablet 10 mg (has no administration in time range)  levothyroxine (SYNTHROID) tablet 150 mcg (has no administration in time range)  pantoprazole (PROTONIX) EC tablet 40 mg (40 mg Oral Not Given 07/03/21 1528)  acidophilus (RISAQUAD) capsule 1 capsule (1 capsule Oral Given 07/03/21 1526)  cilostazol (PLETAL) tablet 50 mg (50 mg Oral Given [id number])  folic acid (FOLVITE) tablet 1 mg (1 mg Oral Given 07/03/21 1526)  Vitamin D (Ergocalciferol) (DRISDOL) capsule 50,000 Units (has no administration in time range)  acetaminophen (TYLENOL) tablet 650 mg (has no administration in time range)  ondansetron (ZOFRAN) injection 4 mg (has no administration in time range)  metoprolol tartrate (LOPRESSOR) tablet 25 mg (has no administration in time range)  clotrimazole-betamethasone (LOTRISONE) cream 1 application (has no administration in time range)  enoxaparin (LOVENOX) injection 80 mg (has no administration in time range)  iohexol (OMNIPAQUE) 350 MG/ML injection 100 mL (100 mLs Intravenous Contrast Given 07/03/21 1331)  potassium chloride SA (KLOR-CON) CR tablet 40 mEq (40 mEq Oral Given 07/03/21 1526)     ED Discharge Orders     None        Note:  This document was prepared using Dragon voice recognition software and may include unintentional dictation errors.   Duffy Bruce, MD 07/03/21 1550

## 2021-07-03 NOTE — ED Notes (Signed)
Patient transported to CT 

## 2021-07-03 NOTE — Progress Notes (Signed)
PHARMACIST - PHYSICIAN COMMUNICATION  CONCERNING:  Enoxaparin (Lovenox) for DVT Prophylaxis    RECOMMENDATION: Patient was prescribed enoxaprin '40mg'$  q24 hours for VTE prophylaxis.   Filed Weights   07/03/21 0831  Weight: (!) 160 kg (352 lb 11.8 oz)    Body mass index is 55.25 kg/m.  Estimated Creatinine Clearance: 131.2 mL/min (by C-G formula based on SCr of 0.68 mg/dL).   Based on [place] patient is candidate for enoxaparin 0.'5mg'$ /kg TBW SQ every 24 hours based on BMI being >30.  DESCRIPTION: Pharmacy has adjusted enoxaparin dose per [facility] policy.  Patient is now receiving enoxaparin 80 mg every 24 hours    Dorothe Pea, PharmD, BCPS Clinical Pharmacist  07/03/2021 2:18 PM

## 2021-07-03 NOTE — ED Triage Notes (Signed)
Started having heart palpitaitons 2 hours ago.  Left side chest pain.

## 2021-07-03 NOTE — Consult Note (Signed)
CARDIOLOGY CONSULT NOTE               Patient ID: Joanne [name] MRN: [id number] DOB/AGE: 1966-06-09 55 y.o.  Admit date: 07/03/2021 Referring Physician [place] Primary Physician Clayborn Bigness Primary Cardiologist Karsten Fells Reason for Consultation palpitations, PVCs  HPI: 55 year old female referred for evaluation of palpitations and PVCs. The patient has a history of Buerger's disease, pulmonary hypertension, hypertension, previous tobacco use, quite 2011, obesity, and NSTEMI in 06/11/21 with cardiac catheterization revealing normal left ventricular function and normal coronary anatomy. She was started on colchicine for possible myocarditis. Since her recent discharge, the patient reports that she has experienced intermittent palpitations, which she describes as feeling like her heart is doing "somersaults, then stops." She is currently wearing an event monitor per her outpatient cardiologist. She reports waking early this morning with left sided neck discomfort that she states felt like she slept on it wrong, which radiated to her shoulder and left upper arm. Massaging the neck temporarily helped, only for the pain to return. Sometimes the pain would radiate to her left upper chest. There were no other known aggravating or alleviating factors, with no exertional component. On one occasion this morning, she felt dizzy with associated palpitations, so presented to the ER for evaluation. She denies shortness of breath or loss of consciousness. Labs notable for normal high sensitivity troponin x 2 (6, 7), TSH 5.135, mag 2.0, normal CBC and renal function. Chest CTA negative for evidence of PE, dissection, or effusion. ECG revealed sinus rhythm at a rate of 87 bpm with occasional PVCs without acute ST-T wave abnormalities. On cardiac monitoring in the ED, it was noted that the patient was having frequent PVCs with 3-4 beat NSVT. Currently, the patient states she feels foggy-headed and has occasional  left sided neck pain without chest pain, shortness of breath, or palpitations. 2D echocardiogram in 06/2021 showed normal left ventricular function with no significant valvular abnormalities.   Review of systems complete and found to [name] negative unless listed above     Past Medical History:  Diagnosis Date   Berger's disease 2011   Buerger's disease ([place])    Family history of adverse reaction to anesthesia    sister and daughter difficult to wake up   Folic acid deficiency    GERD (gastroesophageal reflux disease)    Hypertension    Hypothyroidism    IDA (iron deficiency anemia)    Osteoarthritis    Thrombocytosis     Past Surgical History:  Procedure Laterality Date   ADENOIDECTOMY     Small child   APPENDECTOMY     Age 33   COLON SURGERY  1980   ileostomy and internal pouch age 37   CORONARY/GRAFT ACUTE MI REVASCULARIZATION N/A 06/12/2021   Procedure: Coronary/Graft Acute MI Revascularization;  Surgeon: Isaias Cowman, MD;  Location: [facility];  Service: Cardiovascular;  Laterality: N/A;   DILATATION & CURETTAGE/HYSTEROSCOPY WITH MYOSURE N/A 05/02/2015   Procedure: DILATATION & CURETTAGE/HYSTEROSCOPY WITH MYOSURE;  Surgeon: Malachy Mood, MD;  Location: [facility];  Service: Gynecology;  Laterality: N/A;   LAPAROSCOPIC GASTRIC SLEEVE RESECTION  07/06/2016   LEFT HEART CATH AND CORONARY ANGIOGRAPHY N/A 06/12/2021   Procedure: LEFT HEART CATH AND CORONARY ANGIOGRAPHY;  Surgeon: Isaias Cowman, MD;  Location: [facility];  Service: Cardiovascular;  Laterality: N/A;    (Not in a hospital admission)  Social History   Socioeconomic History   Marital status: Married    Spouse name: Not on  file   Number of children: Not on file   Years of education: Not on file   Highest education level: Not on file  Occupational History   Not on file  Tobacco Use   Smoking status: Former    Types: Cigarettes    Quit date: 04/28/2010    Years since quitting:  11.1   Smokeless tobacco: Never  Substance and Sexual Activity   Alcohol use: No    Alcohol/week: 0.0 standard drinks   Drug use: No   Sexual activity: Yes    Birth control/protection: None  Other Topics Concern   Not on file  Social History Narrative   Not on file   Social Determinants of Health   Financial Resource Strain: Not on file  Food Insecurity: Not on file  Transportation Needs: Not on file  Physical Activity: Not on file  Stress: Not on file  Social Connections: Not on file  Intimate Partner Violence: Not on file    Family History  Problem Relation Age of Onset   Hypertension Mother    Congestive Heart Failure Father    Hypertension Father    Stroke Sister    Skin cancer Maternal Aunt        Basal cell   Skin cancer Maternal Uncle        Basal cell   Congestive Heart Failure Maternal Grandmother    Colon cancer Maternal Grandfather 85   Skin cancer Cousin        Basal Cell      Review of systems complete and found to [name] negative unless listed above      PHYSICAL EXAM  General: Well developed, well nourished, in no acute distress, sitting up in bed HEENT:  Normocephalic and atramatic Neck:  No JVD.  Lungs: Clear bilaterally to auscultation, normal effort of breathing on room air. Heart: HRRR . Normal S1 and S2 without gallops or murmurs.  Abdomen: nondistended Extremities: No clubbing, cyanosis or edema.   Neuro: Alert and oriented X 3. Psych:  Good affect, responds appropriately  Labs:   Lab Results  Component Value Date   WBC 8.7 07/03/2021   HGB 14.8 07/03/2021   HCT 43.5 07/03/2021   MCV 93.3 07/03/2021   PLT 267 07/03/2021    Recent Labs  Lab 07/03/21 0840  NA 139  K 3.8  CL 105  CO2 22  BUN 14  CREATININE 0.68  CALCIUM 9.3  GLUCOSE 108*   No results found for: CKTOTAL, CKMB, CKMBINDEX, TROPONINI  Lab Results  Component Value Date   CHOL 142 09/03/2020   CHOL 135 10/16/2019   CHOL 149 06/20/2018   Lab Results   Component Value Date   HDL 51 09/03/2020   HDL 39 (L) 10/16/2019   HDL 49 06/20/2018   Lab Results  Component Value Date   LDLCALC 72 09/03/2020   LDLCALC 74 10/16/2019   LDLCALC 84 06/20/2018   Lab Results  Component Value Date   TRIG 97 09/03/2020   TRIG 110 10/16/2019   TRIG 81 06/20/2018   Lab Results  Component Value Date   CHOLHDL 2.8 09/03/2020   CHOLHDL 3.5 10/16/2019   CHOLHDL 3.0 06/20/2018   No results found for: LDLDIRECT    Radiology: DG Chest 2 View  Result Date: 07/03/2021 CLINICAL DATA:  Chest pain and palpitations. EXAM: CHEST - 2 VIEW COMPARISON:  CTA chest and chest x-ray dated June 11, 2021. FINDINGS: Stable cardiomediastinal silhouette with borderline cardiomegaly. Normal pulmonary vascularity. No focal  consolidation, pleural effusion, or pneumothorax. No acute osseous abnormality. IMPRESSION: No active cardiopulmonary disease. Electronically Signed   By: Titus Dubin M.D.   On: 07/03/2021 09:31   DG Chest 2 View  Result Date: 06/11/2021 CLINICAL DATA:  Chest pain EXAM: CHEST - 2 VIEW COMPARISON:  04/03/2010 FINDINGS: No focal opacity or pleural effusion. Borderline cardiomegaly with slight central congestion. No pneumothorax. IMPRESSION: Borderline cardiomegaly with slight central congestion Electronically Signed   By: Donavan Foil M.D.   On: 06/11/2021 15:30   CT Angio Chest PE W and/or Wo Contrast  Result Date: 07/03/2021 CLINICAL DATA:  Left neck and shoulder pain, anxiety, high prob suspicion PE EXAM: CT ANGIOGRAPHY CHEST WITH CONTRAST TECHNIQUE: Multidetector CT imaging of the chest was performed using the standard protocol during bolus administration of intravenous contrast. Multiplanar CT image reconstructions and MIPs were obtained to evaluate the vascular anatomy. CONTRAST:  143m OMNIPAQUE IOHEXOL 350 MG/ML SOLN COMPARISON:  06/11/2021 and previous FINDINGS: Cardiovascular: Heart size upper limits normal. No pericardial effusion. The RV is  nondilated. Satisfactory opacification of pulmonary arteries noted, and there is no evidence of pulmonary emboli. Adequate contrast opacification of the thoracic aorta with no evidence of dissection, aneurysm, or stenosis. There is classic 3-vessel brachiocephalic arch anatomy without proximal stenosis. No significant atheromatous change. Mediastinum/Nodes: No mass or adenopathy. Lungs/Pleura: No pleural effusion. No pneumothorax. Pulmonary hypoinflation without confluent airspace disease or nodule. Upper Abdomen: Surgical staples along the greater curvature of the stomach. No acute findings. Musculoskeletal: Upper thoracic levoscoliosis without underlying vertebral anomaly. No fracture or worrisome bone lesion. Review of the MIP images confirms the above findings. IMPRESSION: 1. Negative.  No acute PE or thoracic aortic dissection. Electronically Signed   By: DLucrezia EuropeM.D.   On: 07/03/2021 13:50   CT Angio Chest PE W and/or Wo Contrast  Result Date: 06/11/2021 CLINICAL DATA:  Chest pain EXAM: CT ANGIOGRAPHY CHEST WITH CONTRAST TECHNIQUE: Multidetector CT imaging of the chest was performed using the standard protocol during bolus administration of intravenous contrast. Multiplanar CT image reconstructions and MIPs were obtained to evaluate the vascular anatomy. CONTRAST:  107mOMNIPAQUE IOHEXOL 350 MG/ML SOLN COMPARISON:  09/10/2020 FINDINGS: Cardiovascular: No filling defects in the pulmonary arteries to suggest pulmonary emboli. Heart is borderline in size. Aorta normal caliber. Mediastinum/Nodes: No mediastinal, hilar, or axillary adenopathy. Trachea and esophagus are unremarkable. Thyroid unremarkable. Lungs/Pleura: Lungs are clear. No focal airspace opacities or suspicious nodules. No effusions. Upper Abdomen: Imaging into the upper abdomen demonstrates no acute findings. Musculoskeletal: Chest wall soft tissues are unremarkable. No acute bony abnormality. Review of the MIP images confirms the above  findings. IMPRESSION: No evidence of pulmonary embolus. Borderline heart size. No acute cardiopulmonary disease. Electronically Signed   By: KeRolm Baptise.D.   On: 06/11/2021 19:37   CARDIAC CATHETERIZATION  Result Date: 06/12/2021 1.  Normal coronary anatomy 2.  Normal left ventricular function Recommendations 1.  Dual antiplatelet therapy 2.  DC nitroglycerin drip 3.  DC heparin drip 4.  Continue metoprolol titrate 5.  Nitropaste 1 inch every 6 hours 6.  2D echocardiogram  ECHOCARDIOGRAM COMPLETE  Result Date: 06/12/2021    ECHOCARDIOGRAM REPORT   Patient Name:   Joanne BEKate of Exam: 06/12/2021 Medical Rec #:  [id number]   Height:       67.0 in Accession #:    [id number]  Weight:       352.7 lb Date of Birth:  06/19/24/1967  BSA:          2.574 m Patient Age:    55 years      BP:           107/65 mmHg Patient Gender: F             HR:           60 bpm. Exam Location:  [facility] Procedure: 2D Echo, Cardiac Doppler and Color Doppler Indications:     Acute myocardial Infarction -unspecified I21.9  History:         Patient has prior history of Echocardiogram examinations, most                  recent 06/27/2018. Risk Factors:Hypertension.  Sonographer:     Sherrie Sport RDCS (AE) Referring Phys:  [place] Diagnosing Phys: Bartholome Bill MD  Sonographer Comments: Suboptimal apical window and no subcostal window. IMPRESSIONS  1. Left ventricular ejection fraction, by estimation, is 65 to 70%. The left ventricle has normal function. The left ventricle has no regional wall motion abnormalities. There is mild left ventricular hypertrophy. Left ventricular diastolic parameters were normal.  2. Right ventricular systolic function is normal. The right ventricular size is normal.  3. The mitral valve was not well visualized. Trivial mitral valve regurgitation.  4. The aortic valve is grossly normal. Aortic valve regurgitation is trivial. FINDINGS  Left Ventricle: Left ventricular ejection fraction, by  estimation, is 65 to 70%. The left ventricle has normal function. The left ventricle has no regional wall motion abnormalities. The left ventricular internal cavity size was normal in size. There is  mild left ventricular hypertrophy. Left ventricular diastolic parameters were normal. Right Ventricle: The right ventricular size is normal. No increase in right ventricular wall thickness. Right ventricular systolic function is normal. Left Atrium: Left atrial size was normal in size. Right Atrium: Right atrial size was normal in size. Pericardium: There is no evidence of pericardial effusion. Mitral Valve: The mitral valve was not well visualized. Trivial mitral valve regurgitation. Tricuspid Valve: The tricuspid valve is not well visualized. Tricuspid valve regurgitation is mild. Aortic Valve: The aortic valve is grossly normal. Aortic valve regurgitation is trivial. Aortic valve mean gradient measures 3.0 mmHg. Aortic valve peak gradient measures 6.6 mmHg. Aortic valve area, by VTI measures 2.13 cm. Pulmonic Valve: The pulmonic valve was not well visualized. Pulmonic valve regurgitation is trivial. Aorta: The aortic root is normal in size and structure. IAS/Shunts: The interatrial septum was not well visualized.  LEFT VENTRICLE PLAX 2D LVIDd:         4.80 cm  Diastology LVIDs:         2.63 cm  LV e' medial:    8.05 cm/s LV PW:         1.38 cm  LV E/e' medial:  11.6 LV IVS:        0.92 cm  LV e' lateral:   9.68 cm/s LVOT diam:     2.00 cm  LV E/e' lateral: 9.6 LV SV:         55 LV SV Index:   21 LVOT Area:     3.14 cm  RIGHT VENTRICLE RV Basal diam:  3.42 cm RV S prime:     13.80 cm/s TAPSE (M-mode): 3.2 cm LEFT ATRIUM             Index       RIGHT ATRIUM           Index  LA diam:        3.40 cm 1.32 cm/m  RA Area:     17.70 cm LA Vol (A2C):   64.9 ml 25.22 ml/m RA Volume:   45.80 ml  17.80 ml/m LA Vol (A4C):   25.4 ml 9.87 ml/m LA Biplane Vol: 41.2 ml 16.01 ml/m  AORTIC VALVE                   PULMONIC VALVE  AV Area (Vmax):    2.32 cm    PV Vmax:        0.76 m/s AV Area (Vmean):   2.45 cm    PV Peak grad:   2.3 mmHg AV Area (VTI):     2.13 cm    RVOT Peak grad: 2 mmHg AV Vmax:           128.00 cm/s AV Vmean:          83.700 cm/s AV VTI:            0.260 m AV Peak Grad:      6.6 mmHg AV Mean Grad:      3.0 mmHg LVOT Vmax:         94.60 cm/s LVOT Vmean:        65.300 cm/s LVOT VTI:          0.176 m LVOT/AV VTI ratio: 0.68  AORTA Ao Root diam: 2.63 cm MITRAL VALVE               TRICUSPID VALVE MV Area (PHT): 4.19 cm    TR Peak grad:   11.8 mmHg MV Decel Time: 181 msec    TR Vmax:        172.00 cm/s MV E velocity: 93.00 cm/s MV A velocity: 58.30 cm/s  SHUNTS MV E/A ratio:  1.60        Systemic VTI:  0.18 m                            Systemic Diam: 2.00 cm Bartholome Bill MD Electronically signed by Bartholome Bill MD Signature Date/Time: 06/12/2021/10:27:34 AM    Final     EKG: sinus rhythm, 87 bpm, PVCs  ASSESSMENT AND PLAN:  Palpitations, 3 week history, currently wearing an event monitor per outpatient cardiologist. PVCs have been noted on telemetry. 2D echocardiogram reveals normal left ventricular function. No electrolyte disturbances. Normal coronary anatomy per recent cardiac catheterization earlier this month in setting of NSTEMI Neck and arm pain, with positional component, with negative chest CTA Buerger's disease  Recommendations: Recommend starting metoprolol tartrate for PVCs/palpitations Monitor on telemetry overnight Follow up with outpatient cardiologist as scheduled to review cardiac event monitor results. Defer further cardiac diagnostics at this time.  Signed: Clabe Seal PA-C 07/03/2021, 4:15 PM

## 2021-07-04 DIAGNOSIS — R002 Palpitations: Secondary | ICD-10-CM | POA: Diagnosis not present

## 2021-07-04 LAB — SARS CORONAVIRUS 2 (TAT 6-24 HRS): SARS Coronavirus 2: NEGATIVE

## 2021-07-04 LAB — POTASSIUM: Potassium: 3.9 mmol/L (ref 3.5–5.1)

## 2021-07-04 MED ORDER — MIDODRINE HCL 5 MG PO TABS
5.0000 mg | ORAL_TABLET | Freq: Three times a day (TID) | ORAL | Status: DC
  Administered 2021-07-04 (×2): 5 mg via ORAL
  Filled 2021-07-04 (×2): qty 1

## 2021-07-04 MED ORDER — SODIUM CHLORIDE 0.9 % IV BOLUS
250.0000 mL | Freq: Once | INTRAVENOUS | Status: AC
  Administered 2021-07-04: 250 mL via INTRAVENOUS

## 2021-07-04 MED ORDER — RISAQUAD PO CAPS
1.0000 | ORAL_CAPSULE | Freq: Every day | ORAL | Status: DC
  Administered 2021-07-04: 1 via ORAL
  Filled 2021-07-04: qty 1

## 2021-07-04 MED ORDER — FAMOTIDINE 20 MG PO TABS
20.0000 mg | ORAL_TABLET | Freq: Two times a day (BID) | ORAL | Status: DC
  Administered 2021-07-04 – 2021-07-05 (×2): 20 mg via ORAL
  Filled 2021-07-04 (×3): qty 1

## 2021-07-04 MED ORDER — AMLODIPINE BESYLATE 5 MG PO TABS
2.5000 mg | ORAL_TABLET | Freq: Every day | ORAL | Status: DC
  Filled 2021-07-04: qty 1

## 2021-07-04 MED ORDER — METOPROLOL TARTRATE 25 MG PO TABS
12.5000 mg | ORAL_TABLET | Freq: Two times a day (BID) | ORAL | Status: DC
  Administered 2021-07-04: 12.5 mg via ORAL
  Filled 2021-07-04 (×3): qty 1

## 2021-07-04 MED ORDER — AMLODIPINE BESYLATE 10 MG PO TABS: 10.0000 mg | ORAL_TABLET | Freq: Every day | ORAL | Status: DC

## 2021-07-04 NOTE — ED Notes (Signed)
Pharmacy notified of the need for the 07:00 medication of Lovenox 80 mg SQ

## 2021-07-04 NOTE — ED Notes (Signed)
Pharmacy called and informed of the needed medication synthroid

## 2021-07-04 NOTE — Progress Notes (Signed)
PROGRESS NOTE    [name]  [id number] DOB: Apr 29, 1966 DOA: 07/03/2021 PCP: Lavera Guise, MD    Brief Narrative:  [name] is a 55 y.o. female with medical history significant for morbid obesity (BMI 55), Buerger's disease, hypertension, hypothyroidism, obstructive sleep apnea who presents to the emergency room for evaluation of palpitations.  Patient describes a sensation of feeling her heart racing and also describes feeling some extra beats.  Some of these episodes have been associated with dizziness and lightheadedness but she denies having any falls or loss of consciousness. She also complains of left-sided chest pain as well as pain involving the left neck, left scapula and left arm.  Pain is intermittent and not related to rest or exertion.  She also complains of some shortness of breath but denies having any nausea, no vomiting, no diaphoresis. She was recently hospitalized about 3 weeks ago and at that time was found to have elevated troponin suggestive of cardiac injury.  She had a normal cardiac cath and was treated for presumed viral myocarditis.  She was discharged home on a 2-week course of colchicine which she has completed  CT angiogram of the chest is negative for pulmonary embolism or thoracic aortic dissection.  While on the monitor in the ER she was noted to have episodes of nonsustained ventricular tachycardia. She received a dose of metoprolol in the ER and request was made by ER physician to monitor patient overnight.  7/23- daughter in room sleeping. Pt tells me she is bed bound and uses walker. She is worried about her bp being low.  She denies being dizzy or lightheaded this am while in bed.  Explained to patient that she was given beta-blockers and her blood pressure was low.  That I am going to decrease the dose in half and also decrease her amlodipine.  She reports she takes amlodipine for underlying history of Buergers dz  I explained to her the  palpitations are what brought her here and that she will require some beta blk as was recommended by cardiology. She reports she does not like beta blockers. Spent time explaining medications and regimen changes again. When I explained to patient that we may discharge her later today depending if she tolerates all the meds and her bp remains stable, she kept focusing on why being discharged as she does not have anyone to watch her at home if something happens. Explained to pt again, if her bp is not stable /low and symptomatic, I will keep her overnight. Despite much discussion, later nsg told me pt refused all meds and she wanted more explaination. I asked cardiology to please see pt and give input and explain cardiac management with the patient. Per cardiology note, it appears they were ok with my current med changes.    Consultants:  Cardiology  Procedures:   Antimicrobials:      Subjective:  As above  Objective: Vitals:   07/04/21 1200 07/04/21 1300 07/04/21 1400 07/04/21 1434  BP: 136/71 109/69 (!) 105/51 101/62  Pulse: 74 66 69 (!) 59  Resp: '18 17 17 19  '$ Temp: 98.7 F (37.1 C)   98.1 F (36.7 C)  TempSrc: Oral   Oral  SpO2: 100% 99% 97% 100%  Weight:      Height:       No intake or output data in the 24 hours ending 07/04/21 1452 Filed Weights   07/03/21 0831  Weight: (!) 160 kg    Examination:  General exam: NAD, lying in bed Respiratory system: Clear to auscultation. Respiratory effort normal. Cardiovascular system: S1 & S2 heard, RRR. No gallop  Gastrointestinal system: Abdomen is nondistended, soft and nontender. Normal bowel sounds heard. Central nervous system: Alert and oriented.  Extremities: no edema Skin: warm, dry Psychiatry:  Mood & affect irritable this am    Data Reviewed: I have personally reviewed following labs and imaging studies  CBC: Recent Labs  Lab 07/03/21 0840  WBC 8.7  HGB 14.8  HCT 43.5  MCV 93.3  PLT 99991111   Basic Metabolic  Panel: Recent Labs  Lab 07/03/21 0840 07/04/21 0919  NA 139  --   K 3.8 3.9  CL 105  --   CO2 22  --   GLUCOSE 108*  --   BUN 14  --   CREATININE 0.68  --   CALCIUM 9.3  --   MG 2.0  --    GFR: Estimated Creatinine Clearance: 131.2 mL/min (by C-G formula based on SCr of 0.68 mg/dL). Liver Function Tests: No results for input(s): AST, ALT, ALKPHOS, BILITOT, PROT, ALBUMIN in the last 168 hours. No results for input(s): LIPASE, AMYLASE in the last 168 hours. No results for input(s): AMMONIA in the last 168 hours. Coagulation Profile: No results for input(s): INR, PROTIME in the last 168 hours. Cardiac Enzymes: No results for input(s): CKTOTAL, CKMB, CKMBINDEX, TROPONINI in the last 168 hours. BNP (last 3 results) No results for input(s): PROBNP in the last 8760 hours. HbA1C: No results for input(s): HGBA1C in the last 72 hours. CBG: No results for input(s): GLUCAP in the last 168 hours. Lipid Profile: No results for input(s): CHOL, HDL, LDLCALC, TRIG, CHOLHDL, LDLDIRECT in the last 72 hours. Thyroid Function Tests: Recent Labs    07/03/21 1155  TSH 5.135*   Anemia Panel: No results for input(s): VITAMINB12, FOLATE, FERRITIN, TIBC, IRON, RETICCTPCT in the last 72 hours. Sepsis Labs: No results for input(s): PROCALCITON, LATICACIDVEN in the last 168 hours.  No results found for this or any previous visit (from the past 240 hour(s)).       Radiology Studies: DG Chest 2 View  Result Date: 07/03/2021 CLINICAL DATA:  Chest pain and palpitations. EXAM: CHEST - 2 VIEW COMPARISON:  CTA chest and chest x-ray dated June 11, 2021. FINDINGS: Stable cardiomediastinal silhouette with borderline cardiomegaly. Normal pulmonary vascularity. No focal consolidation, pleural effusion, or pneumothorax. No acute osseous abnormality. IMPRESSION: No active cardiopulmonary disease. Electronically Signed   By: Titus Dubin M.D.   On: 07/03/2021 09:31   CT Angio Chest PE W and/or Wo  Contrast  Result Date: 07/03/2021 CLINICAL DATA:  Left neck and shoulder pain, anxiety, high prob suspicion PE EXAM: CT ANGIOGRAPHY CHEST WITH CONTRAST TECHNIQUE: Multidetector CT imaging of the chest was performed using the standard protocol during bolus administration of intravenous contrast. Multiplanar CT image reconstructions and MIPs were obtained to evaluate the vascular anatomy. CONTRAST:  151m OMNIPAQUE IOHEXOL 350 MG/ML SOLN COMPARISON:  06/11/2021 and previous FINDINGS: Cardiovascular: Heart size upper limits normal. No pericardial effusion. The RV is nondilated. Satisfactory opacification of pulmonary arteries noted, and there is no evidence of pulmonary emboli. Adequate contrast opacification of the thoracic aorta with no evidence of dissection, aneurysm, or stenosis. There is classic 3-vessel brachiocephalic arch anatomy without proximal stenosis. No significant atheromatous change. Mediastinum/Nodes: No mass or adenopathy. Lungs/Pleura: No pleural effusion. No pneumothorax. Pulmonary hypoinflation without confluent airspace disease or nodule. Upper Abdomen: Surgical staples along the greater curvature of  the stomach. No acute findings. Musculoskeletal: Upper thoracic levoscoliosis without underlying vertebral anomaly. No fracture or worrisome bone lesion. Review of the MIP images confirms the above findings. IMPRESSION: 1. Negative.  No acute PE or thoracic aortic dissection. Electronically Signed   By: Lucrezia Europe M.D.   On: 07/03/2021 13:50        Scheduled Meds:  acidophilus  1 capsule Oral QHS   amLODipine  10 mg Oral Q2200   aspirin EC  81 mg Oral Daily   enoxaparin (LOVENOX) injection  80 mg Subcutaneous [id number]   folic acid  1 mg Oral Daily   levothyroxine  150 mcg Oral QAC breakfast   metoprolol tartrate  12.5 mg Oral BID   midodrine  5 mg Oral TID WC   pantoprazole  40 mg Oral BID   [START ON 07/09/2021] Vitamin D (Ergocalciferol)  50,000 Units Oral Q7 days   Continuous  Infusions:  Assessment & Plan:   Principal Problem:   Heart palpitations Active Problems:   Morbid obesity with BMI of 50.0-59.9, adult (HCC)   Hypertension   GERD (gastroesophageal reflux disease)   Buerger's disease (HCC)   Decreased functional mobility   Heart palpitations/PVCs/nonsustained ventricular tachycardia Patient is symptomatic and complains of feeling dizzy and lightheaded associated with these palpitations. 7/23 metoprolol decreased to 12.5 mg twice daily from 25 twice daily since BP low although patient is asymptomatic but concerned  Cardiology's input was appreciated, agreed with current metoprolol dosage of 12.5 mg twice daily  Continue amlodipine for underlying history of Buerger's disease  Defer further cardiac diagnostic at this time  Follow-up with primary cardiologist to review 30-day monitor results  Discharge in a.m.       Hypertension On amlodipine     Morbid obesity (BMI 55)/decreased functional mobility Complicates overall prognosis and care Lifestyle modification and exercise has been discussed gust during this hospitalization with patient in detail Per H&P        Hypothyroidism Continue Synthroid Follow-up with PCP for further management       History of Buerger's disease Continue aspirin, Pletal, and amlodipine   DVT prophylaxis: Lovenox Code Status: Full Family Communication: Daughter at bedside Disposition Plan: Discharge in a.m. Status is: Observation  The patient remains OBS appropriate and will d/c before 2 midnights.  Dispo: The patient is from: Home              Anticipated d/c is to: Home              Patient currently is not medically stable to d/c.   Difficult to place patient No            LOS: 0 days   Time spent: 45 min with >50% on coc    Nolberto Hanlon, MD [facility] Pager [phone]  If 7PM-7AM, please contact night-coverage 07/04/2021, 2:52 PM

## 2021-07-04 NOTE — Progress Notes (Signed)
[facility] Cardiology  SUBJECTIVE: Patient laying in bed, denies chest pain, shortness of breath or palpitations   Vitals:   07/04/21 0507 07/04/21 0528 07/04/21 0644 07/04/21 0646  BP: (!) 104/56 (!) 104/56 (!) 94/50 (!) 94/50  Pulse: (!) 59 (!) 57 60 (!) 58  Resp: 16   16  Temp:      TempSrc:      SpO2: 94%  100% 97%  Weight:      Height:        No intake or output data in the 24 hours ending 07/04/21 1205    PHYSICAL EXAM  General: Well developed, well nourished, in no acute distress HEENT:  Normocephalic and atramatic Neck:  No JVD.  Lungs: Clear bilaterally to auscultation and percussion. Heart: HRRR . Normal S1 and S2 without gallops or murmurs.  Abdomen: Bowel sounds are positive, abdomen soft and non-tender  Msk:  Back normal, normal gait. Normal strength and tone for age. Extremities: No clubbing, cyanosis or edema.   Neuro: Alert and oriented X 3. Psych:  Good affect, responds appropriately   LABS: Basic Metabolic Panel: Recent Labs    07/03/21 0840 07/04/21 0919  NA 139  --   K 3.8 3.9  CL 105  --   CO2 22  --   GLUCOSE 108*  --   BUN 14  --   CREATININE 0.68  --   CALCIUM 9.3  --   MG 2.0  --    Liver Function Tests: No results for input(s): AST, ALT, ALKPHOS, BILITOT, PROT, ALBUMIN in the last 72 hours. No results for input(s): LIPASE, AMYLASE in the last 72 hours. CBC: Recent Labs    07/03/21 0840  WBC 8.7  HGB 14.8  HCT 43.5  MCV 93.3  PLT 267   Cardiac Enzymes: No results for input(s): CKTOTAL, CKMB, CKMBINDEX, TROPONINI in the last 72 hours. BNP: Invalid input(s): POCBNP D-Dimer: No results for input(s): DDIMER in the last 72 hours. Hemoglobin A1C: No results for input(s): HGBA1C in the last 72 hours. Fasting Lipid Panel: No results for input(s): CHOL, HDL, LDLCALC, TRIG, CHOLHDL, LDLDIRECT in the last 72 hours. Thyroid Function Tests: Recent Labs    07/03/21 1155  TSH 5.135*   Anemia Panel: No results for input(s): VITAMINB12,  FOLATE, FERRITIN, TIBC, IRON, RETICCTPCT in the last 72 hours.  DG Chest 2 View  Result Date: 07/03/2021 CLINICAL DATA:  Chest pain and palpitations. EXAM: CHEST - 2 VIEW COMPARISON:  CTA chest and chest x-ray dated June 11, 2021. FINDINGS: Stable cardiomediastinal silhouette with borderline cardiomegaly. Normal pulmonary vascularity. No focal consolidation, pleural effusion, or pneumothorax. No acute osseous abnormality. IMPRESSION: No active cardiopulmonary disease. Electronically Signed   By: Titus Dubin M.D.   On: 07/03/2021 09:31   CT Angio Chest PE W and/or Wo Contrast  Result Date: 07/03/2021 CLINICAL DATA:  Left neck and shoulder pain, anxiety, high prob suspicion PE EXAM: CT ANGIOGRAPHY CHEST WITH CONTRAST TECHNIQUE: Multidetector CT imaging of the chest was performed using the standard protocol during bolus administration of intravenous contrast. Multiplanar CT image reconstructions and MIPs were obtained to evaluate the vascular anatomy. CONTRAST:  131m OMNIPAQUE IOHEXOL 350 MG/ML SOLN COMPARISON:  06/11/2021 and previous FINDINGS: Cardiovascular: Heart size upper limits normal. No pericardial effusion. The RV is nondilated. Satisfactory opacification of pulmonary arteries noted, and there is no evidence of pulmonary emboli. Adequate contrast opacification of the thoracic aorta with no evidence of dissection, aneurysm, or stenosis. There is classic 3-vessel brachiocephalic arch  anatomy without proximal stenosis. No significant atheromatous change. Mediastinum/Nodes: No mass or adenopathy. Lungs/Pleura: No pleural effusion. No pneumothorax. Pulmonary hypoinflation without confluent airspace disease or nodule. Upper Abdomen: Surgical staples along the greater curvature of the stomach. No acute findings. Musculoskeletal: Upper thoracic levoscoliosis without underlying vertebral anomaly. No fracture or worrisome bone lesion. Review of the MIP images confirms the above findings. IMPRESSION: 1.  Negative.  No acute PE or thoracic aortic dissection. Electronically Signed   By: Lucrezia Europe M.D.   On: 07/03/2021 13:50     Echo LVEF 65 to 70% x 2 D echocardiogram 06/12/2021  TELEMETRY: Sinus rhythm at 75 bpm without PVCs:  ASSESSMENT AND PLAN:  Principal Problem:   Heart palpitations Active Problems:   Morbid obesity with BMI of 50.0-59.9, adult (HCC)   Hypertension   GERD (gastroesophageal reflux disease)   Buerger's disease (HCC)   Decreased functional mobility    Palpitations, 3 week history, currently wearing an event monitor per outpatient cardiologist. PVCs have been noted on telemetry. 2D echocardiogram reveals normal left ventricular function. No electrolyte disturbances. Normal coronary anatomy per recent cardiac catheterization earlier this month in setting of NSTEMI, questionable embolic event, less likely vasospasm or myocarditis, on low-dose aspirin, and amlodipine Neck and arm pain, with positional component, with negative chest CTA Buerger's disease, with smoking 11 years ago   Recommendations: Continue current medications Continue low-dose metoprolol tartrate 12.5 mg twice daily Continue amlodipine underlying history of Buerger's disease Defer further cardiac diagnostics at this time Follow-up with cardiologist at [facility] to review 30-day monitor results Likely discharge home in a.m.     Isaias Cowman, MD, PhD, [facility] 07/04/2021 12:05 PM

## 2021-07-04 NOTE — Plan of Care (Signed)
Possible Midodrine side effects:  After 1230 1st time dose - Patients BP is slowly trending down - 108/61.  States she's feeling odd - "bursts of sensations sporadically tingling different parts of her body. It almost feels like the chills." Cardio notified.  After 1700 dose: Patient became diaphoretic, light headed and nauseous with dry heaves.  Vitals stable and no temp found. Patient stated, "I just dont feel right."  Zofran given.  Dr/Cardio Please address pros and cons with patient in AM and if we should stop this med.

## 2021-07-05 DIAGNOSIS — I10 Essential (primary) hypertension: Secondary | ICD-10-CM

## 2021-07-05 DIAGNOSIS — I731 Thromboangiitis obliterans [Buerger's disease]: Secondary | ICD-10-CM | POA: Diagnosis not present

## 2021-07-05 DIAGNOSIS — I472 Ventricular tachycardia: Secondary | ICD-10-CM | POA: Diagnosis not present

## 2021-07-05 DIAGNOSIS — R002 Palpitations: Secondary | ICD-10-CM | POA: Diagnosis not present

## 2021-07-05 DIAGNOSIS — Z6841 Body Mass Index (BMI) 40.0 and over, adult: Secondary | ICD-10-CM

## 2021-07-05 DIAGNOSIS — I4729 Other ventricular tachycardia: Secondary | ICD-10-CM

## 2021-07-05 DIAGNOSIS — E876 Hypokalemia: Secondary | ICD-10-CM

## 2021-07-05 LAB — GASTROINTESTINAL PANEL BY PCR, STOOL (REPLACES STOOL CULTURE)

## 2021-07-05 LAB — C DIFFICILE QUICK SCREEN W PCR REFLEX
C Diff antigen: NEGATIVE
C Diff interpretation: NOT DETECTED
C Diff toxin: NEGATIVE

## 2021-07-05 MED ORDER — POTASSIUM CHLORIDE 20 MEQ PO PACK
20.0000 meq | PACK | Freq: Every day | ORAL | Status: DC
  Administered 2021-07-05: 20 meq via ORAL
  Filled 2021-07-05: qty 1

## 2021-07-05 MED ORDER — ASPIRIN 81 MG PO TBEC: 81.0000 mg | DELAYED_RELEASE_TABLET | Freq: Two times a day (BID) | ORAL | Status: DC

## 2021-07-05 MED ORDER — POTASSIUM CHLORIDE 20 MEQ PO PACK: 20.0000 meq | PACK | Freq: Every day | ORAL | Status: DC

## 2021-07-05 MED ORDER — POTASSIUM CHLORIDE CRYS ER 10 MEQ PO TBCR: 10.0000 meq | EXTENDED_RELEASE_TABLET | Freq: Every day | ORAL | 0 refills | Status: DC

## 2021-07-05 NOTE — Discharge Summary (Signed)
[facility] NAME: [name]    MR#:  [id number]  DATE OF BIRTH:  [birth year]  DATE OF ADMISSION:  07/03/2021 ADMITTING PHYSICIAN: Collier Bullock, MD  DATE OF DISCHARGE: 07/05/2021  PRIMARY CARE PHYSICIAN: Lavera Guise, MD    ADMISSION DIAGNOSIS:  Heart palpitations [R00.2] Nonsustained ventricular tachycardia ([place]) [I47.2]  DISCHARGE DIAGNOSIS:  Principal Problem:   Heart palpitations Active Problems:   Morbid obesity with BMI of 50.0-59.9, adult (HCC)   Hypertension   GERD (gastroesophageal reflux disease)   Buerger's disease ([place])   Decreased functional mobility   SECONDARY DIAGNOSIS:   Past Medical History:  Diagnosis Date   Berger's disease 2011   Buerger's disease ([place])    Family history of adverse reaction to anesthesia    sister and daughter difficult to wake up   Folic acid deficiency    GERD (gastroesophageal reflux disease)    Hypertension    Hypothyroidism    IDA (iron deficiency anemia)    Osteoarthritis    Thrombocytosis     HOSPITAL COURSE:   Heart palpitations, nonsustained ventricular tachycardia, multiple PVCs.  Patient was tried on a beta-blocker but unable to tolerate with hypotension and not feeling well.  Beta-blocker discontinued.  Previous cardiac catheterization and echocardiogram normal.  Follow-up with outpatient cardiologist to read her monitor. Hypokalemia replace potassium orally.  Recommend checking potassium as outpatient Essential hypertension and Buerger's disease on amlodipine and aspirin Morbid obesity with a BMI of 55.55 Chronic diarrhea.  Stool studies negative Chronic hypoxia wears oxygen as needed at home.  CT scanning of the chest negative for pulmonary embolism.  Recommend checking PFTs as outpatient.  Patient has a pulse ox at home. Sleep apnea on CPAP at night.  Patient has nasal prongs may benefit from a facemask. Hypothyroidism unspecified on  levothyroxine  DISCHARGE CONDITIONS:   Satisfactory  CONSULTS OBTAINED:  Treatment Team:  Isaias Cowman, MD  DRUG ALLERGIES:   Allergies  Allergen Reactions   Yeast-Related Products    Tape Rash    Some tapes cause a rash, possible the paper tape.    DISCHARGE MEDICATIONS:   Allergies as of 07/05/2021       Reactions   Yeast-related Products    Tape Rash   Some tapes cause a rash, possible the paper tape.        Medication List     STOP taking these medications    cilostazol 50 MG tablet Commonly known as: PLETAL   colchicine 0.6 MG tablet       TAKE these medications    amLODipine 10 MG tablet Commonly known as: NORVASC TAKE ONE TABLET BY MOUTH EVERY NIGHT AT BEDTIME   aspirin 81 MG EC tablet Commonly known as: Aspir-Low Take 1 tablet (81 mg total) by mouth 2 (two) times daily. Swallow whole. What changed:  when to take this Another medication with the same name was removed. Continue taking this medication, and follow the directions you see here.   clotrimazole-betamethasone cream Commonly known as: LOTRISONE Apply 1 application topically 2 (two) times daily.   Cranberry 1000 MG Caps Take 1 tablet by mouth daily.   famotidine 20 MG tablet Commonly known as: PEPCID TAKE ONE TABLET BY MOUTH TWICE A DAY   Flinstones Gummies Omega-3 DHA Chew Chew 2 tablets by mouth daily.   folic acid 1 MG tablet Commonly known as: FOLVITE Take 1 tablet (1 mg total) by mouth daily.   levonorgestrel 20 MCG/24HR  IUD Commonly known as: MIRENA by Intrauterine route.   levothyroxine 150 MCG tablet Commonly known as: SYNTHROID Take 1 tablet (150 mcg total) by mouth daily before breakfast.   pantoprazole 40 MG tablet Commonly known as: PROTONIX Take 1 tablet (40 mg total) by mouth 2 (two) times daily.   potassium chloride 10 MEQ tablet Commonly known as: KLOR-CON Take 1 tablet (10 mEq total) by mouth daily. Start taking on: July 06, 2021    PROBIOTIC ADVANCED PO Take 1 capsule by mouth daily.   Vitamin D (Ergocalciferol) 1.25 MG (50000 UNIT) Caps capsule Commonly known as: DRISDOL TAKE 1 CAPSULE ONCE A WEEK         DISCHARGE INSTRUCTIONS:  Follow-up PMD 5 days Follow-up your cardiologist  If you experience worsening of your admission symptoms, develop shortness of breath, life threatening emergency, suicidal or homicidal thoughts you must seek medical attention immediately by calling 911 or calling your MD immediately  if symptoms less severe.  You Must read complete instructions/literature along with all the possible adverse reactions/side effects for all the Medicines you take and that have been prescribed to you. Take any new Medicines after you have completely understood and accept all the possible adverse reactions/side effects.   Please note  You were cared for by a hospitalist during your hospital stay. If you have any questions about your discharge medications or the care you received while you were in the hospital after you are discharged, you can call the unit and asked to speak with the hospitalist on call if the hospitalist that took care of you is not available. Once you are discharged, your primary care physician will handle any further medical issues. Please note that NO REFILLS for any discharge medications will be authorized once you are discharged, as it is imperative that you return to your primary care physician (or establish a relationship with a primary care physician if you do not have one) for your aftercare needs so that they can reassess your need for medications and monitor your lab values.    Today   CHIEF COMPLAINT:   Chief Complaint  Patient presents with   Chest Pain    HISTORY OF PRESENT ILLNESS:  [name]  is a 55 y.o. female came in with chest pain and palpitation   VITAL SIGNS:  Blood pressure (!) 152/90, pulse 88, temperature 98.4 F (36.9 C), temperature source Oral,  resp. rate 20, height '5\' 7"'$  (1.702 m), weight (!) 160.9 kg, SpO2 98 %.  I/O:   Intake/Output Summary (Last 24 hours) at 07/05/2021 1752 Last data filed at 07/04/2021 1942 Gross per 24 hour  Intake 250 ml  Output --  Net 250 ml    PHYSICAL EXAMINATION:  GENERAL:  55 y.o.-year-old patient lying in the bed with no acute distress.  EYES: Pupils equal, round, reactive to light and accommodation. No scleral icterus. HEENT: Head atraumatic, normocephalic. Oropharynx and nasopharynx clear.  LUNGS: Normal breath sounds bilaterally, no wheezing, rales,rhonchi or crepitation. No use of accessory muscles of respiration.  CARDIOVASCULAR: S1, S2 normal. No murmurs, rubs, or gallops.  ABDOMEN: Soft, non-tender, non-distended.  EXTREMITIES: Trace pedal edema.  NEUROLOGIC: Cranial nerves II through XII are intact. PSYCHIATRIC: The patient is alert and oriented x 3.  SKIN: No obvious rash, lesion, or ulcer.   DATA REVIEW:   CBC Recent Labs  Lab 07/03/21 0840  WBC 8.7  HGB 14.8  HCT 43.5  PLT 267    Chemistries  Recent Labs  Lab  07/03/21 0840 07/04/21 0919  NA 139  --   K 3.8 3.9  CL 105  --   CO2 22  --   GLUCOSE 108*  --   BUN 14  --   CREATININE 0.68  --   CALCIUM 9.3  --   MG 2.0  --      Microbiology Results  Results for orders placed or performed during the hospital encounter of 07/03/21  SARS CORONAVIRUS 2 (TAT 6-24 HRS) Nasopharyngeal Nasopharyngeal Swab     Status: None   Collection Time: 07/04/21  6:45 AM   Specimen: Nasopharyngeal Swab  Result Value Ref Range Status   SARS Coronavirus 2 NEGATIVE NEGATIVE Final    Comment: (NOTE) SARS-CoV-2 target nucleic acids are NOT DETECTED.  The SARS-CoV-2 RNA is generally detectable in upper and lower respiratory specimens during the acute phase of infection. Negative results do not preclude SARS-CoV-2 infection, do not rule out co-infections with other pathogens, and should not be used as the sole basis for treatment or  other patient management decisions. Negative results must be combined with clinical observations, patient history, and epidemiological information. The expected result is Negative.  Fact Sheet for Patients: SugarRoll.be  Fact Sheet for Healthcare Providers: https://www.woods-mathews.com/  This test is not yet approved or cleared by the [place] FDA and  has been authorized for detection and/or diagnosis of SARS-CoV-2 by FDA under an Emergency Use Authorization (EUA). This EUA will remain  in effect (meaning this test can be used) for the duration of the COVID-19 declaration under Se ction 564(b)(1) of the Act, 21 U.S.C. section 360bbb-3(b)(1), unless the authorization is terminated or revoked sooner.  Performed at [facility], [place] [address], [place], [place] [address]   C Difficile Quick Screen w PCR reflex     Status: None   Collection Time: 07/05/21 10:02 AM   Specimen: STOOL  Result Value Ref Range Status   C Diff antigen NEGATIVE NEGATIVE Final   C Diff toxin NEGATIVE NEGATIVE Final   C Diff interpretation No C. difficile detected.  Final    Comment: Performed at [facility], [address] [place], [place] [address]  Gastrointestinal Panel by PCR , Stool     Status: None   Collection Time: 07/05/21 10:02 AM   Specimen: STOOL  Result Value Ref Range Status   Campylobacter species NOT DETECTED NOT DETECTED Final   Plesimonas shigelloides NOT DETECTED NOT DETECTED Final   Salmonella species NOT DETECTED NOT DETECTED Final   Yersinia enterocolitica NOT DETECTED NOT DETECTED Final   Vibrio species NOT DETECTED NOT DETECTED Final   Vibrio cholerae NOT DETECTED NOT DETECTED Final   Enteroaggregative E coli (EAEC) NOT DETECTED NOT DETECTED Final   Enteropathogenic E coli (EPEC) NOT DETECTED NOT DETECTED Final   Enterotoxigenic E coli (ETEC) NOT DETECTED NOT DETECTED Final   Shiga like toxin producing E coli  (STEC) NOT DETECTED NOT DETECTED Final   Shigella/Enteroinvasive E coli (EIEC) NOT DETECTED NOT DETECTED Final   Cryptosporidium NOT DETECTED NOT DETECTED Final   Cyclospora cayetanensis NOT DETECTED NOT DETECTED Final   Entamoeba histolytica NOT DETECTED NOT DETECTED Final   Giardia lamblia NOT DETECTED NOT DETECTED Final   Adenovirus F40/41 NOT DETECTED NOT DETECTED Final   Astrovirus NOT DETECTED NOT DETECTED Final   Norovirus GI/GII NOT DETECTED NOT DETECTED Final   Rotavirus A NOT DETECTED NOT DETECTED Final   Sapovirus (I, II, IV, and V) NOT DETECTED NOT DETECTED Final  Comment: Performed at [facility], [address] [place], [place] [address]      Management plans discussed with the patient, family and they are in agreement.  CODE STATUS:     Code Status Orders  (From admission, onward)           Start     Ordered   07/03/21 1405  Full code  Continuous        07/03/21 1406           Code Status History     Date Active Date Inactive Code Status Order ID Comments User Context   06/11/2021 2224 06/13/2021 2239 Full Code [id number]  Elwyn Reach, MD ED       TOTAL TIME TAKING CARE OF THIS PATIENT: 36 minutes.    Loletha Grayer M.D on 07/05/2021 at 5:52 PM   [facility]  CC: Primary care physician; Lavera Guise, MD

## 2021-07-05 NOTE — Discharge Instructions (Addendum)
Call your oxygen company about a face mask for your cpap machine instead of the nasal prongs  Rec outpatient Pulmonary function tests  Rec repeat potasium level as outpatient

## 2021-07-05 NOTE — Progress Notes (Signed)
[facility]  SUBJECTIVE: Patient laying in bed, denies chest pain or shortness of breath   Vitals:   07/04/21 1930 07/04/21 2022 07/05/21 0359 07/05/21 0751  BP: (!) 105/57 106/61 (!) 108/57 (!) 115/58  Pulse: 62 64 (!) 57 61  Resp: '15 17 17 18  '$ Temp:  98 F (36.7 C) 97.7 F (36.5 C) 99.1 F (37.3 C)  TempSrc:  Oral Oral Oral  SpO2: 99% 98% 100% 100%  Weight:   (!) 160.9 kg   Height:         Intake/Output Summary (Last 24 hours) at 07/05/2021 1041 Last data filed at 07/04/2021 1942 Gross per 24 hour  Intake 250 ml  Output --  Net 250 ml      PHYSICAL EXAM  General: Well developed, well nourished, in no acute distress HEENT:  Normocephalic and atramatic Neck:  No JVD.  Lungs: Clear bilaterally to auscultation and percussion. Heart: HRRR . Normal S1 and S2 without gallops or murmurs.  Abdomen: Bowel sounds are positive, abdomen soft and non-tender  Msk:  Back normal, normal gait. Normal strength and tone for age. Extremities: No clubbing, cyanosis or edema.   Neuro: Alert and oriented X 3. Psych:  Good affect, responds appropriately   LABS: Basic Metabolic Panel: Recent Labs    07/03/21 0840 07/04/21 0919  NA 139  --   K 3.8 3.9  CL 105  --   CO2 22  --   GLUCOSE 108*  --   BUN 14  --   CREATININE 0.68  --   CALCIUM 9.3  --   MG 2.0  --    Liver Function Tests: No results for input(s): AST, ALT, ALKPHOS, BILITOT, PROT, ALBUMIN in the last 72 hours. No results for input(s): LIPASE, AMYLASE in the last 72 hours. CBC: Recent Labs    07/03/21 0840  WBC 8.7  HGB 14.8  HCT 43.5  MCV 93.3  PLT 267   Cardiac Enzymes: No results for input(s): CKTOTAL, CKMB, CKMBINDEX, TROPONINI in the last 72 hours. BNP: Invalid input(s): POCBNP D-Dimer: No results for input(s): DDIMER in the last 72 hours. Hemoglobin A1C: No results for input(s): HGBA1C in the last 72 hours. Fasting Lipid Panel: No results for input(s): CHOL, HDL, LDLCALC, TRIG, CHOLHDL,  LDLDIRECT in the last 72 hours. Thyroid Function Tests: Recent Labs    07/03/21 1155  TSH 5.135*   Anemia Panel: No results for input(s): VITAMINB12, FOLATE, FERRITIN, TIBC, IRON, RETICCTPCT in the last 72 hours.  CT Angio Chest PE W and/or Wo Contrast  Result Date: 07/03/2021 CLINICAL DATA:  Left neck and shoulder pain, anxiety, high prob suspicion PE EXAM: CT ANGIOGRAPHY CHEST WITH CONTRAST TECHNIQUE: Multidetector CT imaging of the chest was performed using the standard protocol during bolus administration of intravenous contrast. Multiplanar CT image reconstructions and MIPs were obtained to evaluate the vascular anatomy. CONTRAST:  16m OMNIPAQUE IOHEXOL 350 MG/ML SOLN COMPARISON:  06/11/2021 and previous FINDINGS: Cardiovascular: Heart size upper limits normal. No pericardial effusion. The RV is nondilated. Satisfactory opacification of pulmonary arteries noted, and there is no evidence of pulmonary emboli. Adequate contrast opacification of the thoracic aorta with no evidence of dissection, aneurysm, or stenosis. There is classic 3-vessel brachiocephalic arch anatomy without proximal stenosis. No significant atheromatous change. Mediastinum/Nodes: No mass or adenopathy. Lungs/Pleura: No pleural effusion. No pneumothorax. Pulmonary hypoinflation without confluent airspace disease or nodule. Upper Abdomen: Surgical staples along the greater curvature of the stomach. No acute findings. Musculoskeletal: Upper thoracic levoscoliosis without  underlying vertebral anomaly. No fracture or worrisome bone lesion. Review of the MIP images confirms the above findings. IMPRESSION: 1. Negative.  No acute PE or thoracic aortic dissection. Electronically Signed   By: Lucrezia Europe M.D.   On: 07/03/2021 13:50     Echo LVEF 65 to 70% x 2 D echocardiogram 06/12/2021  TELEMETRY: Sinus rhythm at 70 bpm:  ASSESSMENT AND PLAN:  Principal Problem:   Heart palpitations Active Problems:   Morbid obesity with BMI of  50.0-59.9, adult (HCC)   Hypertension   GERD (gastroesophageal reflux disease)   Buerger's disease (HCC)   Decreased functional mobility    Palpitations, 3 week history, currently wearing an event monitor per outpatient cardiologist. PVCs have been noted on telemetry. 2D echocardiogram reveals normal left ventricular function. No electrolyte disturbances.  Unable to tolerate metoprolol tartrate. Normal coronary anatomy per recent cardiac catheterization earlier this month in setting of NSTEMI, questionable embolic event, less likely vasospasm or myocarditis, on low-dose aspirin, and amlodipine Neck and arm pain, with positional component, with negative chest CTA Buerger's disease, with smoking 11 years ago, on amlodipine   Recommendations: Continue current medications Continue metoprolol to tartrate Continue amlodipine underlying history of Buerger's disease Defer further cardiac diagnostics at this time Follow-up with cardiologist at [facility] to review 30-day monitor results Likely discharge home today   Isaias Cowman, MD, PhD, [facility] 07/05/2021 10:41 AM

## 2021-07-05 NOTE — Plan of Care (Signed)
  Problem: Pain Managment: Goal: General experience of comfort will improve Outcome: Progressing   

## 2021-07-07 ENCOUNTER — Encounter: Payer: Self-pay | Admitting: Internal Medicine

## 2021-07-09 ENCOUNTER — Ambulatory Visit: Payer: Managed Care, Other (non HMO) | Admitting: Internal Medicine

## 2021-07-09 ENCOUNTER — Encounter: Payer: Self-pay | Admitting: Internal Medicine

## 2021-07-09 ENCOUNTER — Other Ambulatory Visit: Payer: Self-pay

## 2021-07-09 DIAGNOSIS — Z23 Encounter for immunization: Secondary | ICD-10-CM

## 2021-07-09 DIAGNOSIS — I472 Ventricular tachycardia: Secondary | ICD-10-CM

## 2021-07-09 DIAGNOSIS — I4729 Other ventricular tachycardia: Secondary | ICD-10-CM

## 2021-07-09 DIAGNOSIS — R778 Other specified abnormalities of plasma proteins: Secondary | ICD-10-CM | POA: Diagnosis not present

## 2021-07-09 DIAGNOSIS — E038 Other specified hypothyroidism: Secondary | ICD-10-CM | POA: Diagnosis not present

## 2021-07-09 NOTE — Progress Notes (Signed)
[facility] [name], [name] [id number]  Internal MEDICINE  Office Visit Note  Patient Name: [name]  [id number]  [id number]  Date of Service: 07/19/2021  Chief Complaint  Patient presents with   Hospitalization Follow-up    Shaky, dizzy, confused, memory loss, speech issues    HPI Pt is here for follow up. Has multiple problems with complicated medical history  Hospitalized with c/p, elevated troponin level, Has been having palpitations.BP was found to be low, she was taken off of some of the meds like pletal and Norvasc, she was found be in non sustained ventricular tachycardia  Wheel chair bound due to morbid obesity  recent history including recent NSTEMI despite normal coronaries here with palpitations and intermittent tachycardia.  The patient states that over the last year, she has had essentially ongoing, intermittent chest pain and palpitations.  She was actually just hospitalized for NSTEMI with significant troponin elevation, but had normal coronaries.  She is currently seeing a cardiologist at [facility].  He suspects it could be a component of vasospasm versus underlying vascular disease Tells me that her thyroid levels might be off in the hospital  Current Medication: Outpatient Encounter Medications as of 07/09/2021  Medication Sig   aspirin (ASPIR-LOW) 81 MG EC tablet Take 1 tablet (81 mg total) by mouth 2 (two) times daily. Swallow whole.   Cranberry 1000 MG CAPS Take 1 tablet by mouth daily.   famotidine (PEPCID) 20 MG tablet TAKE ONE TABLET BY MOUTH TWICE A DAY   folic acid (FOLVITE) 1 MG tablet Take 1 tablet (1 mg total) by mouth daily.   levonorgestrel (MIRENA) 20 MCG/24HR IUD by Intrauterine route.   levothyroxine (SYNTHROID) 150 MCG tablet Take 1 tablet (150 mcg total) by mouth daily before breakfast.   pantoprazole (PROTONIX) 40 MG tablet Take 1 tablet (40 mg total) by mouth 2 (two) times daily.   Pediatric Multiple Vit-C-FA (FLINSTONES  GUMMIES OMEGA-3 DHA) CHEW Chew 2 tablets by mouth daily.   potassium chloride (KLOR-CON) 10 MEQ tablet Take 1 tablet (10 mEq total) by mouth daily.   Probiotic Product (PROBIOTIC ADVANCED PO) Take 1 capsule by mouth daily.   Vitamin D, Ergocalciferol, (DRISDOL) 1.25 MG (50000 UNIT) CAPS capsule TAKE 1 CAPSULE ONCE A WEEK   [DISCONTINUED] amLODipine (NORVASC) 10 MG tablet TAKE ONE TABLET BY MOUTH EVERY NIGHT AT BEDTIME (Patient not taking: Reported on 07/09/2021)   [DISCONTINUED] clotrimazole-betamethasone (LOTRISONE) cream Apply 1 application topically 2 (two) times daily. (Patient not taking: Reported on 07/09/2021)   No facility-administered encounter medications on file as of 07/09/2021.    Surgical History: Past Surgical History:  Procedure Laterality Date   ADENOIDECTOMY     Small child   APPENDECTOMY     Age 38   COLON SURGERY  1980   ileostomy and internal pouch age 17   CORONARY/GRAFT ACUTE MI REVASCULARIZATION N/A 06/12/2021   Procedure: Coronary/Graft Acute MI Revascularization;  Surgeon: Isaias Cowman, MD;  Location: [place] [facility];  Service: Cardiovascular;  Laterality: N/A;   DILATATION & CURETTAGE/HYSTEROSCOPY WITH MYOSURE N/A 05/02/2015   Procedure: DILATATION & CURETTAGE/HYSTEROSCOPY WITH MYOSURE;  Surgeon: Malachy Mood, MD;  Location: [facility];  Service: Gynecology;  Laterality: N/A;   LAPAROSCOPIC GASTRIC SLEEVE RESECTION  07/06/2016   LEFT HEART CATH AND CORONARY ANGIOGRAPHY N/A 06/12/2021   Procedure: LEFT HEART CATH AND CORONARY ANGIOGRAPHY;  Surgeon: Isaias Cowman, MD;  Location: [place] [facility];  Service: Cardiovascular;  Laterality: N/A;  Medical History: Past Medical History:  Diagnosis Date   Berger's disease 2011   Buerger's disease ([place])    Family history of adverse reaction to anesthesia    sister and daughter difficult to wake up   Folic acid deficiency    GERD (gastroesophageal reflux disease)    Hypertension     Hypothyroidism    IDA (iron deficiency anemia)    Osteoarthritis    PVC's (premature ventricular contractions)    Thrombocytosis     Family History: Family History  Problem Relation Age of Onset   Hypertension Mother    Congestive Heart Failure Father    Hypertension Father    Stroke Sister    Skin cancer Maternal Aunt        Basal cell   Skin cancer Maternal Uncle        Basal cell   Congestive Heart Failure Maternal Grandmother    Colon cancer Maternal Grandfather 85   Skin cancer Cousin        Basal Cell    Social History   Socioeconomic History   Marital status: Married    Spouse name: Not on file   Number of children: Not on file   Years of education: Not on file   Highest education level: Not on file  Occupational History   Not on file  Tobacco Use   Smoking status: Former    Types: Cigarettes    Quit date: 04/28/2010    Years since quitting: 11.2   Smokeless tobacco: Never  Substance and Sexual Activity   Alcohol use: No    Alcohol/week: 0.0 standard drinks   Drug use: No   Sexual activity: Yes    Birth control/protection: None  Other Topics Concern   Not on file  Social History Narrative   Not on file   Social Determinants of Health   Financial Resource Strain: Not on file  Food Insecurity: Not on file  Transportation Needs: Not on file  Physical Activity: Not on file  Stress: Not on file  Social Connections: Not on file  Intimate Partner Violence: Not on file      Review of Systems  Constitutional:  Negative for fatigue and fever.  HENT:  Negative for congestion, mouth sores and postnasal drip.   Respiratory:  Negative for cough.   Cardiovascular:  Positive for palpitations. Negative for chest pain.  Genitourinary:  Negative for flank pain.  Psychiatric/Behavioral: Negative.     Vital Signs: BP 138/86   Pulse 71   Temp 98.4 F (36.9 C)   Resp 16   Ht 5' 7.5" (1.715 m)   Wt (!) 341 lb (154.7 kg)   SpO2 98%   BMI 52.62 kg/m     Physical Exam Constitutional:      Appearance: Normal appearance.  HENT:     Head: Normocephalic and atraumatic.     Nose: Nose normal.     Mouth/Throat:     Mouth: Mucous membranes are moist.     Pharynx: No posterior oropharyngeal erythema.  Eyes:     Extraocular Movements: Extraocular movements intact.     Pupils: Pupils are equal, round, and reactive to light.  Cardiovascular:     Rate and Rhythm: Regular rhythm.     Pulses: Normal pulses.     Heart sounds: Normal heart sounds.  Pulmonary:     Effort: Pulmonary effort is normal.     Breath sounds: Normal breath sounds.  Neurological:     General: No focal deficit  present.     Mental Status: She is alert.  Psychiatric:        Mood and Affect: Mood normal.        Behavior: Behavior normal.     Assessment/Plan: 1. Non-sustained ventricular tachycardia ([place]) Followed by cardiology, has wearing a monitor   2. Need for shingles vaccine - Varicella-zoster vaccine IM (Shingrix)  3. Elevated troponin I level Followed by cardiology, might be due to vasospasm.   4. Other specified hypothyroidism - TSH + free T4  5. Need for vaccination against Streptococcus pneumoniae using pneumococcal conjugate vaccine 13 - Pneumococcal conjugate vaccine 13-valent IM   General Counseling: Rihanna verbalizes understanding of the findings of todays visit and agrees with plan of treatment. I have discussed any further diagnostic evaluation that may be needed or ordered today. We also reviewed her medications today. she has been encouraged to call the office with any questions or concerns that should arise related to todays visit.    Orders Placed This Encounter  Procedures   Pneumococcal conjugate vaccine 13-valent IM   Varicella-zoster vaccine IM (Shingrix)   TSH + free T4    No orders of the defined types were placed in this encounter.   Total time spent:45 Minutes Time spent includes review of chart, medications, test  results, and follow up plan with the patient.   [place] Controlled Substance Database was reviewed by me.   Dr Lavera Guise Internal medicine

## 2021-07-17 ENCOUNTER — Telehealth: Payer: Self-pay

## 2021-07-17 NOTE — Telephone Encounter (Signed)
Faxed prescription for bariatric blood pressure machine to [facility] ATTN: JASON at [phone]

## 2021-07-20 ENCOUNTER — Encounter: Payer: Self-pay | Admitting: Internal Medicine

## 2021-07-27 ENCOUNTER — Other Ambulatory Visit: Payer: Self-pay | Admitting: Internal Medicine

## 2021-08-04 ENCOUNTER — Other Ambulatory Visit
Admission: RE | Admit: 2021-08-04 | Discharge: 2021-08-04 | Disposition: A | Discharge status: Home / Self Care | Payer: 59 | Attending: Internal Medicine | Admitting: Internal Medicine

## 2021-08-04 ENCOUNTER — Encounter: Payer: Self-pay | Admitting: Internal Medicine

## 2021-08-04 DIAGNOSIS — E038 Other specified hypothyroidism: Secondary | ICD-10-CM | POA: Diagnosis not present

## 2021-08-04 LAB — T4, FREE: Free T4: 1.12 ng/dL (ref 0.61–1.12)

## 2021-08-04 LAB — TSH: TSH: 18.307 u[IU]/mL — ABNORMAL HIGH (ref 0.350–4.500)

## 2021-08-06 ENCOUNTER — Encounter: Payer: Self-pay | Admitting: Internal Medicine

## 2021-08-10 ENCOUNTER — Telehealth: Payer: Self-pay | Admitting: Internal Medicine

## 2021-08-10 NOTE — Telephone Encounter (Signed)
On 8/29- I called pt re: her concerns-given patient's recent elevation of troponin/cardiac cath no evidence of CAD.  Patient complaining of pain fingertips/random [seen rheumatology]. Will discuss with patient's primary cardiologist Dr. Christa See at [facility]; will also discuss with Dr.Dew.   H/T-please reach out to Alvira Philips, patient's cardiologist at [facility] my number-to call back.  Thanks GB

## 2021-08-11 ENCOUNTER — Telehealth: Payer: Self-pay | Admitting: *Deleted

## 2021-08-11 NOTE — Telephone Encounter (Signed)
Msg left for cardiology team

## 2021-08-11 NOTE — Telephone Encounter (Signed)
Left vm for Dr. Christa See admin assistant line [phone]) to call Dr. B directly.

## 2021-08-13 ENCOUNTER — Encounter: Payer: 59 | Admitting: Internal Medicine

## 2021-08-13 NOTE — Telephone Encounter (Signed)
Left 2nd msg for cardiology at [facility] to call Dr. Rogue Bussing

## 2021-08-14 ENCOUNTER — Telehealth: Payer: Self-pay | Admitting: Internal Medicine

## 2021-08-14 NOTE — Telephone Encounter (Signed)
Spoke to patient that we have reached out to her heart doctor to discuss anticoagulation.  Waiting a callback.  Recommend she also reach out to cardiologist office.

## 2021-08-18 ENCOUNTER — Telehealth: Payer: Self-pay | Admitting: *Deleted

## 2021-08-18 ENCOUNTER — Other Ambulatory Visit: Payer: Self-pay | Admitting: Internal Medicine

## 2021-08-18 DIAGNOSIS — K219 Gastro-esophageal reflux disease without esophagitis: Secondary | ICD-10-CM

## 2021-08-18 NOTE — Telephone Encounter (Signed)
per patient- cardiologist 'never rcvd any msg from our office. Our office has left several msgs previously. Call attempted for the 3rd time. Detailed vm left on ([id number] option 3- requesting call back directly to Dr. Aletha Halim cell #.

## 2021-08-20 ENCOUNTER — Other Ambulatory Visit: Payer: Self-pay | Admitting: Internal Medicine

## 2021-08-20 ENCOUNTER — Encounter: Payer: Self-pay | Admitting: Internal Medicine

## 2021-08-20 MED ORDER — CVS ASPIRIN LOW STRENGTH 81 MG PO TBEC: 162.0000 mg | DELAYED_RELEASE_TABLET | Freq: Every day | ORAL | 1 refills | Status: DC

## 2021-08-20 NOTE — Telephone Encounter (Signed)
Can u check

## 2021-08-20 NOTE — Progress Notes (Signed)
Please call pt and find out which dose of synthroid is she taking

## 2021-08-25 ENCOUNTER — Other Ambulatory Visit: Payer: Self-pay

## 2021-08-25 MED ORDER — THYROID 30 MG PO TABS: ORAL_TABLET | ORAL | 1 refills | Status: DC

## 2021-08-26 ENCOUNTER — Encounter: Payer: Self-pay | Admitting: *Deleted

## 2021-08-26 ENCOUNTER — Encounter: Payer: Self-pay | Admitting: Internal Medicine

## 2021-08-26 NOTE — Progress Notes (Signed)
I spoke to patient's cardiologist at [facility] who feels that patient does not have any confirm history of arrhythmia that could explain embolic source. Hence I would also recommend avoiding anticoagulation. I left a message for the patient with me about recommendations an increase to call us if any questions or concerns.

## 2021-09-07 ENCOUNTER — Encounter: Payer: Self-pay | Admitting: Internal Medicine

## 2021-09-07 ENCOUNTER — Other Ambulatory Visit: Payer: Self-pay

## 2021-09-07 DIAGNOSIS — K269 Duodenal ulcer, unspecified as acute or chronic, without hemorrhage or perforation: Secondary | ICD-10-CM

## 2021-09-07 MED ORDER — PANTOPRAZOLE SODIUM 40 MG PO TBEC: 40.0000 mg | DELAYED_RELEASE_TABLET | Freq: Two times a day (BID) | ORAL | 3 refills | Status: DC

## 2021-09-17 ENCOUNTER — Encounter: Payer: 59 | Admitting: Internal Medicine

## 2021-09-17 ENCOUNTER — Telehealth: Payer: Self-pay

## 2021-09-17 NOTE — Telephone Encounter (Signed)
Left vm to confirm 09/21/21 appointment-[name]

## 2021-09-21 ENCOUNTER — Encounter: Payer: Self-pay | Admitting: Internal Medicine

## 2021-09-21 ENCOUNTER — Ambulatory Visit (INDEPENDENT_AMBULATORY_CARE_PROVIDER_SITE_OTHER): Payer: Managed Care, Other (non HMO) | Admitting: Internal Medicine

## 2021-09-21 ENCOUNTER — Other Ambulatory Visit: Payer: Self-pay

## 2021-09-21 VITALS — BP 128/75 | HR 75 | Temp 98.3°F | Resp 16 | Ht 67.5 in | Wt 352.0 lb

## 2021-09-21 DIAGNOSIS — Z1231 Encounter for screening mammogram for malignant neoplasm of breast: Secondary | ICD-10-CM

## 2021-09-21 DIAGNOSIS — K6389 Other specified diseases of intestine: Secondary | ICD-10-CM | POA: Diagnosis not present

## 2021-09-21 DIAGNOSIS — Z9989 Dependence on other enabling machines and devices: Secondary | ICD-10-CM

## 2021-09-21 DIAGNOSIS — I10 Essential (primary) hypertension: Secondary | ICD-10-CM

## 2021-09-21 DIAGNOSIS — M25562 Pain in left knee: Secondary | ICD-10-CM

## 2021-09-21 DIAGNOSIS — I744 Embolism and thrombosis of arteries of extremities, unspecified: Secondary | ICD-10-CM

## 2021-09-21 DIAGNOSIS — E038 Other specified hypothyroidism: Secondary | ICD-10-CM

## 2021-09-21 DIAGNOSIS — R300 Dysuria: Secondary | ICD-10-CM

## 2021-09-21 DIAGNOSIS — G8929 Other chronic pain: Secondary | ICD-10-CM

## 2021-09-21 DIAGNOSIS — Z0001 Encounter for general adult medical examination with abnormal findings: Secondary | ICD-10-CM | POA: Diagnosis not present

## 2021-09-21 DIAGNOSIS — G4733 Obstructive sleep apnea (adult) (pediatric): Secondary | ICD-10-CM

## 2021-09-21 DIAGNOSIS — I4729 Other ventricular tachycardia: Secondary | ICD-10-CM

## 2021-09-21 DIAGNOSIS — M25561 Pain in right knee: Secondary | ICD-10-CM

## 2021-09-21 NOTE — Progress Notes (Signed)
[facility] [place], [place] [address]  Internal MEDICINE  Office Visit Note  Patient Name: [name]  [id number]  [id number]  Date of Service: 09/23/2021  Chief Complaint  Patient presents with   Annual Exam   Hypertension     HPI Pt is here for routine health maintenance examination, patient has had multiple concerns about having a feeling of chest pressure or nervousness, she is not sure if is related to her heart failure she is just being anxious Patient is also having feeling of abdominal distention comfortable to make sure travels from 1 side to the other and patient has to push her stomach to get relief.  [name] is 55 y.o. year old female with a history of Buerger's disease diagnosed 2011, ulcerative colitis s/p total colectomy age 56, htn, tobacco use quit 2011, hypothyroidism and obesity.patient has been evaluated by cardiology for possible pulmonary hypertension . She is followed by [facility] rheumatologist Dr. Harlow Asa. 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 For her Raynaud's/ Buerger's she is on ASA, amlodipine, cilostazol 100mg  bid. There is hx of small intestinal bacterial overgrowth and recent Rx for candidiasis (inguinal area). She has OSA and has CPAP. She continues struggle with her weight, due to her history of surgery she is unable to tolerate high-protein and high-fiber diet. Patient has had elevated TSH over the last few LAB testing, she did not take her Armour Thyroid as prescribed because she did not feel palpable. She is also on the Mirena however I have advised her against it due to her history of Buerger's disease increased thromboembolic risk factors Current Medication: Outpatient Encounter Medications as of 09/21/2021  Medication Sig   amLODipine (NORVASC) 10 MG  tablet Take 10 mg by mouth daily.   Cranberry 1000 MG CAPS Take 1 tablet by mouth daily.   [employer] ASPIRIN LOW STRENGTH 81 MG EC tablet Take 2 tablets (162 mg total) by mouth daily. Swallow whole.   Cyanocobalamin (VITAMIN B-12 SL) Place 1,000 mcg under the tongue.   famotidine (PEPCID) 20 MG tablet TAKE 1 TABLET BY MOUTH TWICE A DAY   folic acid (FOLVITE) 1 MG tablet Take 1 tablet (1 mg total) by mouth daily.   levonorgestrel (MIRENA) 20 MCG/24HR IUD by Intrauterine route.   levothyroxine (SYNTHROID) 150 MCG tablet TAKE 1 TABLET BY MOUTH EVERY DAY BEFORE BREAKFAST   pantoprazole (PROTONIX) 40 MG tablet Take 1 tablet (40 mg total) by mouth 2 (two) times daily.   Pediatric Multiple Vitamins (MULTIVITAMIN CHILDRENS PO) Take by mouth. Flinstones complete generic version   potassium chloride (KLOR-CON) 10 MEQ tablet Take 1 tablet (10 mEq total) by mouth daily. (Patient taking differently: Take 10 mEq by mouth daily. Takes every other day)   Probiotic Product (PROBIOTIC ADVANCED PO) Take 1 capsule by mouth daily.   Vitamin D, Ergocalciferol, (DRISDOL) 1.25 MG (50000 UNIT) CAPS capsule TAKE 1 CAPSULE ONCE A WEEK   thyroid (ARMOUR) 30 MG tablet Take 1 tablet (30 mg) daily along with the levothyroxine (Patient not taking: Reported on 09/21/2021)   [DISCONTINUED] Pediatric Multiple Vit-C-FA (FLINSTONES GUMMIES OMEGA-3 DHA) CHEW Chew 2 tablets by mouth daily. (Patient not taking: Reported on 09/21/2021)   No facility-administered encounter medications on file as of 09/21/2021.    Surgical History: Past Surgical History:  Procedure Laterality Date   ADENOIDECTOMY  Small child   APPENDECTOMY     Age 75   COLON SURGERY  1980   ileostomy and internal pouch age 64   CORONARY/GRAFT ACUTE MI REVASCULARIZATION N/A 06/12/2021   Procedure: Coronary/Graft Acute MI Revascularization;  Surgeon: Isaias Cowman, MD;  Location: [place] [facility];  Service: Cardiovascular;  Laterality: N/A;   DILATATION &  CURETTAGE/HYSTEROSCOPY WITH MYOSURE N/A 05/02/2015   Procedure: DILATATION & CURETTAGE/HYSTEROSCOPY WITH MYOSURE;  Surgeon: Malachy Mood, MD;  Location: [facility] OR[facility];  Service: Gynecology;  Laterality: N/A;   LAPAROSCOPIC GASTRIC SLEEVE RESECTION  07/06/2016   LEFT HEART CATH AND CORONARY ANGIOGRAPHY N/A 06/12/2021   Procedure: LEFT HEART CATH AND CORONARY ANGIOGRAPHY;  Surgeon: Isaias Cowman, MD;  Location: [place] [facility];  Service: Cardiovascular;  Laterality: N/A;    Medical History: Past Medical History:  Diagnosis Date   Berger's disease 2011   Buerger's disease ([place])    Family history of adverse reaction to anesthesia    sister and daughter difficult to wake up   Folic acid deficiency    GERD (gastroesophageal reflux disease)    Hypertension    Hypothyroidism    IDA (iron deficiency anemia)    Osteoarthritis    PVC's (premature ventricular contractions)    Thrombocytosis     Family History: Family History  Problem Relation Age of Onset   Clotting disorder Mother    Hypertension Mother    Congestive Heart Failure Father    Hypertension Father    Stroke Sister    Rheum arthritis Sister    Congestive Heart Failure Maternal Grandmother    Colon cancer Maternal Grandfather 85   Skin cancer Maternal Aunt        Basal cell   Skin cancer Maternal Uncle        Basal cell   Skin cancer Cousin        Basal Cell    Social History: Social History   Socioeconomic History   Marital status: Married    Spouse name: Not on file   Number of children: Not on file   Years of education: Not on file   Highest education level: Not on file  Occupational History   Not on file  Tobacco Use   Smoking status: Former    Types: Cigarettes    Quit date: 04/28/2010    Years since quitting: 11.4   Smokeless tobacco: Never  Substance and Sexual Activity   Alcohol use: No    Alcohol/week: 0.0 standard drinks   Drug use: No   Sexual activity: Yes    Birth  control/protection: None  Other Topics Concern   Not on file  Social History Narrative   Not on file   Social Determinants of Health   Financial Resource Strain: Not on file  Food Insecurity: Not on file  Transportation Needs: Not on file  Physical Activity: Not on file  Stress: Not on file  Social Connections: Not on file      Review of Systems  Constitutional:  Negative for chills, fatigue and unexpected weight change.  HENT:  Negative for congestion, postnasal drip, rhinorrhea, sneezing and sore throat.   Eyes:  Negative for redness.  Respiratory:  Negative for cough, chest tightness and shortness of breath.   Cardiovascular:  Negative for chest pain and palpitations.  Gastrointestinal:  Negative for abdominal pain, constipation, diarrhea, nausea and vomiting.  Genitourinary:  Negative for dysuria and frequency.  Musculoskeletal:  Negative for arthralgias, back pain, joint swelling and neck pain.  Skin:  Negative for rash.  Neurological: Negative.  Negative for tremors and numbness.  Hematological:  Negative for adenopathy. Does not bruise/bleed easily.  Psychiatric/Behavioral:  Negative for behavioral problems (Depression), sleep disturbance and suicidal ideas. The patient is nervous/anxious.     Vital Signs: BP 128/75   Pulse 75   Temp 98.3 F (36.8 C)   Resp 16   Ht 5' 7.5" (1.715 m)   Wt (!) 352 lb (159.7 kg)   SpO2 97%   BMI 54.32 kg/m    Physical Exam Constitutional:      Appearance: She is obese.  HENT:     Head: Normocephalic and atraumatic.     Right Ear: Tympanic membrane normal.     Left Ear: Tympanic membrane normal.  Eyes:     Extraocular Movements: Extraocular movements intact.     Pupils: Pupils are equal, round, and reactive to light.  Cardiovascular:     Rate and Rhythm: Normal rate and regular rhythm.  Pulmonary:     Effort: Pulmonary effort is normal.     Breath sounds: Normal breath sounds.  Skin:    General: Skin is warm.   Neurological:     Mental Status: She is alert.     LABS: Recent Results (from the past 2160 hour(s))  Basic metabolic panel     Status: Abnormal   Collection Time: 07/03/21  8:40 AM  Result Value Ref Range   Sodium 139 135 - 145 mmol/L   Potassium 3.8 3.5 - 5.1 mmol/L   Chloride 105 98 - 111 mmol/L   CO2 22 22 - 32 mmol/L   Glucose, Bld 108 (H) 70 - 99 mg/dL    Comment: Glucose reference range applies only to samples taken after fasting for at least 8 hours.   BUN 14 6 - 20 mg/dL   Creatinine, Ser 0.68 0.44 - 1.00 mg/dL   Calcium 9.3 8.9 - 10.3 mg/dL   GFR, Estimated >60 >60 mL/min    Comment: (NOTE) Calculated using the CKD-EPI Creatinine Equation (2021)    Anion gap 12 5 - 15    Comment: Performed at [facility], [address] [place], [place] [address]  CBC     Status: None   Collection Time: 07/03/21  8:40 AM  Result Value Ref Range   WBC 8.7 4.0 - 10.5 K/uL   RBC 4.66 3.87 - 5.11 MIL/uL   Hemoglobin 14.8 12.0 - 15.0 g/dL   HCT 43.5 36.0 - 46.0 %   MCV 93.3 80.0 - 100.0 fL   MCH 31.8 26.0 - 34.0 pg   MCHC 34.0 30.0 - 36.0 g/dL   RDW 14.5 11.5 - 15.5 %   Platelets 267 150 - 400 K/uL   nRBC 0.0 0.0 - 0.2 %    Comment: Performed at [facility], [place], [place] [address]  Troponin I (High Sensitivity)     Status: None   Collection Time: 07/03/21  8:40 AM  Result Value Ref Range   Troponin I (High Sensitivity) 6 <18 ng/L    Comment: (NOTE) Elevated high sensitivity troponin I (hsTnI) values and significant  changes across serial measurements may suggest ACS but many other  chronic and acute conditions are known to elevate hsTnI results.  Refer to the "Links" section for chest pain algorithms and additional  guidance. Performed at [facility], [address] [place], [place] [address]   Magnesium     Status: None   Collection Time:  07/03/21  8:40 AM  Result Value Ref Range   Magnesium 2.0 1.7 - 2.4 mg/dL     Comment: Performed at [facility], [place], [place] [address]  Troponin I (High Sensitivity)     Status: None   Collection Time: 07/03/21 11:55 AM  Result Value Ref Range   Troponin I (High Sensitivity) 7 <18 ng/L    Comment: (NOTE) Elevated high sensitivity troponin I (hsTnI) values and significant  changes across serial measurements may suggest ACS but many other  chronic and acute conditions are known to elevate hsTnI results.  Refer to the "Links" section for chest pain algorithms and additional  guidance. Performed at [facility], [address] [place], [place] [address]   TSH     Status: Abnormal   Collection Time: 07/03/21 11:55 AM  Result Value Ref Range   TSH 5.135 (H) 0.350 - 4.500 uIU/mL    Comment: Performed by a 3rd Generation assay with a functional sensitivity of <=0.01 uIU/mL. Performed at [facility], [address] [place], [place] [address]   POC urine preg, ED     Status: Normal   Collection Time: 07/03/21 12:37 PM  Result Value Ref Range   Preg Test, Ur Negative Negative  SARS CORONAVIRUS 2 (TAT 6-24 HRS) Nasopharyngeal Nasopharyngeal Swab     Status: None   Collection Time: 07/04/21  6:45 AM   Specimen: Nasopharyngeal Swab  Result Value Ref Range   SARS Coronavirus 2 NEGATIVE NEGATIVE    Comment: (NOTE) SARS-CoV-2 target nucleic acids are NOT DETECTED.  The SARS-CoV-2 RNA is generally detectable in upper and lower respiratory specimens during the acute phase of infection. Negative results do not preclude SARS-CoV-2 infection, do not rule out co-infections with other pathogens, and should not be used as the sole basis for treatment or other patient management decisions. Negative results must be combined with clinical observations, patient history, and epidemiological information. The expected result is Negative.  Fact Sheet for Patients: SugarRoll.be  Fact Sheet for  Healthcare Providers: https://www.woods-mathews.com/  This test is not yet approved or cleared by the [place] FDA and  has been authorized for detection and/or diagnosis of SARS-CoV-2 by FDA under an Emergency Use Authorization (EUA). This EUA will remain  in effect (meaning this test can be used) for the duration of the COVID-19 declaration under Se ction 564(b)(1) of the Act, 21 U.S.C. section 360bbb-3(b)(1), unless the authorization is terminated or revoked sooner.  Performed at [facility], [place] [address] [place], [place] [address]   Potassium     Status: None   Collection Time: 07/04/21  9:19 AM  Result Value Ref Range   Potassium 3.9 3.5 - 5.1 mmol/L    Comment: Performed at [facility] [place], [place] [address]  C Difficile Quick Screen w PCR reflex     Status: None   Collection Time: 07/05/21 10:02 AM   Specimen: STOOL  Result Value Ref Range   C Diff antigen NEGATIVE NEGATIVE   C Diff toxin NEGATIVE NEGATIVE   C Diff interpretation No C. difficile detected.     Comment: Performed at [facility], [address] [place], [place] [address]  Gastrointestinal Panel by PCR , Stool     Status: None   Collection Time: 07/05/21 10:02 AM   Specimen: STOOL  Result Value Ref Range   Campylobacter species NOT DETECTED NOT DETECTED   Plesimonas shigelloides NOT DETECTED NOT DETECTED  Salmonella species NOT DETECTED NOT DETECTED   Yersinia enterocolitica NOT DETECTED NOT DETECTED   Vibrio species NOT DETECTED NOT DETECTED   Vibrio cholerae NOT DETECTED NOT DETECTED   Enteroaggregative E coli (EAEC) NOT DETECTED NOT DETECTED   Enteropathogenic E coli (EPEC) NOT DETECTED NOT DETECTED   Enterotoxigenic E coli (ETEC) NOT DETECTED NOT DETECTED   Shiga like toxin producing E coli (STEC) NOT DETECTED NOT DETECTED   Shigella/Enteroinvasive E coli (EIEC) NOT DETECTED NOT DETECTED   Cryptosporidium NOT DETECTED NOT DETECTED    Cyclospora cayetanensis NOT DETECTED NOT DETECTED   Entamoeba histolytica NOT DETECTED NOT DETECTED   Giardia lamblia NOT DETECTED NOT DETECTED   Adenovirus F40/41 NOT DETECTED NOT DETECTED   Astrovirus NOT DETECTED NOT DETECTED   Norovirus GI/GII NOT DETECTED NOT DETECTED   Rotavirus A NOT DETECTED NOT DETECTED   Sapovirus (I, II, IV, and V) NOT DETECTED NOT DETECTED    Comment: Performed at [facility], [address] [place], [place] [address]  TSH     Status: Abnormal   Collection Time: 08/04/21 10:33 AM  Result Value Ref Range   TSH 18.307 (H) 0.350 - 4.500 uIU/mL    Comment: Performed by a 3rd Generation assay with a functional sensitivity of <=0.01 uIU/mL. Performed at [facility], [address] [place], [place] [address]   T4, free     Status: None   Collection Time: 08/04/21 10:33 AM  Result Value Ref Range   Free T4 1.12 0.61 - 1.12 ng/dL    Comment: (NOTE) Biotin ingestion may interfere with free T4 tests. If the results are inconsistent with the TSH level, previous test results, or the clinical presentation, then consider biotin interference. If needed, order repeat testing after stopping biotin. Performed at [facility], [address] [place], [place] [address]   UA/M w/rflx Culture, Routine     Status: Abnormal (Preliminary result)   Collection Time: 09/21/21  4:45 PM   Specimen: Urine   Urine  Result Value Ref Range   Specific Gravity, UA 1.019 1.005 - 1.030   pH, UA 5.0 5.0 - 7.5   Color, UA Yellow Yellow   Appearance Ur Turbid (A) Clear   Leukocytes,UA 1+ (A) Negative   Protein,UA Negative Negative/Trace   Glucose, UA Negative Negative   Ketones, UA Negative Negative   RBC, UA Negative Negative   Bilirubin, UA Negative Negative   Urobilinogen, Ur 0.2 0.2 - 1.0 mg/dL   Nitrite, UA Negative Negative   Microscopic Examination See below:     Comment: Microscopic was indicated and was performed.   Urinalysis Reflex  Comment     Comment: This specimen has reflexed to a Urine Culture.  Microscopic Examination     Status: Abnormal   Collection Time: 09/21/21  4:45 PM   Urine  Result Value Ref Range   WBC, UA 6-10 (A) 0 - 5 /hpf   RBC None seen 0 - 2 /hpf   Epithelial Cells (non renal) 0-10 0 - 10 /hpf   Casts None seen None seen /lpf   Bacteria, UA None seen None seen/Few  Urine Culture, Reflex     Status: None (Preliminary result)   Collection Time: 09/21/21  4:45 PM   Urine  Result Value Ref Range   Urine Culture, Routine WILL FOLLOW        Assessment/Plan: 1. Encounter for general adult medical examination with abnormal findings We will update her preventive health maintenance  2. Small  intestinal bacterial overgrowth (SIBO) Patient is being followed by GI - CBC with Differential/Platelet - Lipid Panel With LDL/HDL Ratio - TSH - T4, free - Comprehensive metabolic panel - Fe+TIBC+Fer - B12 and Folate Panel - Vitamin D (25 hydroxy) - Vitamin B1 - Amylase - PT and PTT - Troponin I (High Sensitivity); Future  3. Encounter for screening mammogram for malignant neoplasm of breast - MM DIGITAL SCREENING BILATERAL; Future - PT and PTT - Troponin I (High Sensitivity); Future  3. Hypothyroidism  patient is instructed to continue taking her levothyroxine and Armour for better bioavailability  5. Essential hypertension, benign Continue Norvasc as before - CBC with Differential/Platelet - Lipid Panel With LDL/HDL Ratio - TSH - T4, free - Comprehensive metabolic panel - Fe+TIBC+Fer - B12 and Folate Panel - Vitamin D (25 hydroxy) - Vitamin B1 - Amylase - PT and PTT - Troponin I (High Sensitivity); Future  6. Chronic pain of both knees Functional limitation due to obesity syndrome Patient has been instructed in the past due to Go for Water therapy if possible  7. Morbid obesity ([place]) Patient is still gastric sleeve multiple abdominal surgeries transitory ulcerative colitis  plan we will get benefit from seeing a metabolic obesity center at [facility] or [facility]  8. OSA on CPAP Continue CPAP as before, she needs to have overnight oximetry checked - Pulse oximetry, overnight; Future  9. Non-sustained ventricular tachycardia Patient had extremely elevated troponin levels she will like to have these rechecked - PT and PTT - Troponin I (High Sensitivity); Future  10. Dysuria  - UA/M w/rflx Culture, Routine    General Counseling: Sandy Salaam understanding of the findings of todays visit and agrees with plan of treatment. I have discussed any further diagnostic evaluation that may be needed or ordered today. We also reviewed her medications today. she has been encouraged to call the office with any questions or concerns that should arise related to todays visit.    Counseling:  [place] Controlled Substance Database was reviewed by me.  Orders Placed This Encounter  Procedures   Microscopic Examination   Urine Culture, Reflex   MM DIGITAL SCREENING BILATERAL   CBC with Differential/Platelet   Lipid Panel With LDL/HDL Ratio   TSH   T4, free   Comprehensive metabolic panel   Fe+TIBC+Fer   B12 and Folate Panel   Vitamin D (25 hydroxy)   Vitamin B1   Amylase   PT and PTT   UA/M w/rflx Culture, Routine   Pulse oximetry, overnight     Total time spent:30 Minutes  Time spent includes review of chart, medications, test results, and follow up plan with the patient.     Lavera Guise, MD  Internal Medicine

## 2021-09-22 ENCOUNTER — Encounter: Payer: Self-pay | Admitting: Internal Medicine

## 2021-09-22 ENCOUNTER — Telehealth: Payer: Self-pay

## 2021-09-22 NOTE — Telephone Encounter (Signed)
Yeah, I saw it earlier this morning.

## 2021-09-22 NOTE — Telephone Encounter (Signed)
Lmom to call us back 

## 2021-09-22 NOTE — Telephone Encounter (Signed)
Please read

## 2021-09-23 ENCOUNTER — Telehealth: Payer: Self-pay

## 2021-09-23 NOTE — Telephone Encounter (Signed)
Sent community message to [employer] with [facility] to set pt up for Overnight pulse oximetry

## 2021-09-27 ENCOUNTER — Encounter: Payer: Self-pay | Admitting: Internal Medicine

## 2021-09-29 LAB — MICROSCOPIC EXAMINATION
Bacteria, UA: NONE SEEN
Casts: NONE SEEN /lpf
RBC, Urine: NONE SEEN /hpf (ref 0–2)

## 2021-09-29 LAB — UA/M W/RFLX CULTURE, ROUTINE
Bilirubin, UA: NEGATIVE
Glucose, UA: NEGATIVE
Ketones, UA: NEGATIVE
Nitrite, UA: NEGATIVE
Protein,UA: NEGATIVE
RBC, UA: NEGATIVE
Specific Gravity, UA: 1.019 (ref 1.005–1.030)
Urobilinogen, Ur: 0.2 mg/dL (ref 0.2–1.0)
pH, UA: 5 (ref 5.0–7.5)

## 2021-09-29 LAB — URINE CULTURE, REFLEX

## 2021-09-30 ENCOUNTER — Other Ambulatory Visit: Payer: Self-pay

## 2021-09-30 ENCOUNTER — Encounter: Payer: Self-pay | Admitting: Internal Medicine

## 2021-09-30 DIAGNOSIS — K6389 Other specified diseases of intestine: Secondary | ICD-10-CM

## 2021-09-30 DIAGNOSIS — R300 Dysuria: Secondary | ICD-10-CM

## 2021-09-30 DIAGNOSIS — I10 Essential (primary) hypertension: Secondary | ICD-10-CM

## 2021-09-30 DIAGNOSIS — E038 Other specified hypothyroidism: Secondary | ICD-10-CM

## 2021-09-30 DIAGNOSIS — Z0001 Encounter for general adult medical examination with abnormal findings: Secondary | ICD-10-CM

## 2021-09-30 DIAGNOSIS — I4729 Other ventricular tachycardia: Secondary | ICD-10-CM

## 2021-09-30 DIAGNOSIS — Z1231 Encounter for screening mammogram for malignant neoplasm of breast: Secondary | ICD-10-CM

## 2021-09-30 DIAGNOSIS — G4733 Obstructive sleep apnea (adult) (pediatric): Secondary | ICD-10-CM

## 2021-09-30 DIAGNOSIS — G8929 Other chronic pain: Secondary | ICD-10-CM

## 2021-09-30 DIAGNOSIS — M25562 Pain in left knee: Secondary | ICD-10-CM

## 2021-10-05 ENCOUNTER — Other Ambulatory Visit: Payer: Self-pay

## 2021-10-05 ENCOUNTER — Other Ambulatory Visit: Payer: Self-pay | Admitting: Internal Medicine

## 2021-10-05 MED ORDER — CIPROFLOXACIN HCL 500 MG PO TABS: 500.0000 mg | ORAL_TABLET | Freq: Two times a day (BID) | ORAL | 0 refills | Status: DC

## 2021-10-05 MED ORDER — CIPROFLOXACIN HCL 500 MG PO TABS: 500.0000 mg | ORAL_TABLET | Freq: Two times a day (BID) | ORAL | 0 refills | Status: AC

## 2021-10-09 ENCOUNTER — Other Ambulatory Visit: Payer: Self-pay | Admitting: Internal Medicine

## 2021-10-09 DIAGNOSIS — I10 Essential (primary) hypertension: Secondary | ICD-10-CM

## 2021-10-09 DIAGNOSIS — I4729 Other ventricular tachycardia: Secondary | ICD-10-CM

## 2021-10-09 DIAGNOSIS — G8929 Other chronic pain: Secondary | ICD-10-CM

## 2021-10-09 DIAGNOSIS — Z1231 Encounter for screening mammogram for malignant neoplasm of breast: Secondary | ICD-10-CM

## 2021-10-09 DIAGNOSIS — G4733 Obstructive sleep apnea (adult) (pediatric): Secondary | ICD-10-CM

## 2021-10-09 DIAGNOSIS — K6389 Other specified diseases of intestine: Secondary | ICD-10-CM

## 2021-10-09 DIAGNOSIS — E038 Other specified hypothyroidism: Secondary | ICD-10-CM

## 2021-10-09 DIAGNOSIS — R300 Dysuria: Secondary | ICD-10-CM

## 2021-10-09 DIAGNOSIS — Z0001 Encounter for general adult medical examination with abnormal findings: Secondary | ICD-10-CM

## 2021-10-09 DIAGNOSIS — M25561 Pain in right knee: Secondary | ICD-10-CM

## 2021-10-13 ENCOUNTER — Encounter: Payer: Self-pay | Admitting: Internal Medicine

## 2021-10-15 ENCOUNTER — Other Ambulatory Visit
Admission: RE | Admit: 2021-10-15 | Discharge: 2021-10-15 | Disposition: A | Discharge status: Home / Self Care | Payer: 59 | Attending: Internal Medicine | Admitting: Internal Medicine

## 2021-10-15 ENCOUNTER — Other Ambulatory Visit: Payer: Self-pay

## 2021-10-15 ENCOUNTER — Encounter: Payer: Self-pay | Admitting: Internal Medicine

## 2021-10-15 DIAGNOSIS — R778 Other specified abnormalities of plasma proteins: Secondary | ICD-10-CM

## 2021-10-15 LAB — CBC WITH DIFFERENTIAL/PLATELET
Abs Immature Granulocytes: 0.04 10*3/uL (ref 0.00–0.07)
Basophils Absolute: 0 10*3/uL (ref 0.0–0.1)
Basophils Relative: 0 %
Eosinophils Absolute: 0.3 10*3/uL (ref 0.0–0.5)
Eosinophils Relative: 3 %
HCT: 46.2 % — ABNORMAL HIGH (ref 36.0–46.0)
Hemoglobin: 15.4 g/dL — ABNORMAL HIGH (ref 12.0–15.0)
Immature Granulocytes: 0 %
Lymphocytes Relative: 32 %
Lymphs Abs: 3.2 10*3/uL (ref 0.7–4.0)
MCH: 31 pg (ref 26.0–34.0)
MCHC: 33.3 g/dL (ref 30.0–36.0)
MCV: 93.1 fL (ref 80.0–100.0)
Monocytes Absolute: 0.7 10*3/uL (ref 0.1–1.0)
Monocytes Relative: 7 %
Neutro Abs: 5.7 10*3/uL (ref 1.7–7.7)
Neutrophils Relative %: 58 %
Platelets: 283 10*3/uL (ref 150–400)
RBC: 4.96 MIL/uL (ref 3.87–5.11)
RDW: 15.1 % (ref 11.5–15.5)
WBC: 9.8 10*3/uL (ref 4.0–10.5)
nRBC: 0 % (ref 0.0–0.2)

## 2021-10-15 LAB — COMPREHENSIVE METABOLIC PANEL
ALT: 21 U/L (ref 0–44)
AST: 20 U/L (ref 15–41)
Albumin: 3.8 g/dL (ref 3.5–5.0)
Alkaline Phosphatase: 81 U/L (ref 38–126)
Anion gap: 10 (ref 5–15)
BUN: 9 mg/dL (ref 6–20)
CO2: 25 mmol/L (ref 22–32)
Calcium: 8.7 mg/dL — ABNORMAL LOW (ref 8.9–10.3)
Chloride: 103 mmol/L (ref 98–111)
Creatinine, Ser: 0.66 mg/dL (ref 0.44–1.00)
GFR, Estimated: >60 mL/min (ref >60)
Glucose, Bld: 96 mg/dL (ref 70–99)
Potassium: 3.7 mmol/L (ref 3.5–5.1)
Sodium: 138 mmol/L (ref 135–145)
Total Bilirubin: 0.9 mg/dL (ref 0.3–1.2)
Total Protein: 7.5 g/dL (ref 6.5–8.1)

## 2021-10-15 LAB — LIPID PANEL
Cholesterol: 131 mg/dL (ref 0–200)
HDL: 48 mg/dL (ref >40)
LDL Cholesterol: 69 mg/dL (ref 0–99)
Total CHOL/HDL Ratio: 2.7 RATIO
Triglycerides: 69 mg/dL (ref <150)
VLDL: 14 mg/dL (ref 0–40)

## 2021-10-15 LAB — APTT: aPTT: 25 seconds (ref 24–36)

## 2021-10-15 LAB — TSH: TSH: 24.895 u[IU]/mL — ABNORMAL HIGH (ref 0.350–4.500)

## 2021-10-15 LAB — PROTIME-INR
INR: 1.1 (ref 0.8–1.2)
Prothrombin Time: 13.9 seconds (ref 11.4–15.2)

## 2021-10-15 LAB — T4, FREE: Free T4: 1.06 ng/dL (ref 0.61–1.12)

## 2021-10-15 LAB — FOLATE: Folate: 36 ng/mL (ref >5.9)

## 2021-10-15 LAB — TROPONIN I (HIGH SENSITIVITY): Troponin I (High Sensitivity): 4 ng/L (ref <18)

## 2021-10-15 LAB — IRON AND TIBC
Iron: 108 ug/dL (ref 28–170)
Saturation Ratios: 27 % (ref 10.4–31.8)
TIBC: 402 ug/dL (ref 250–450)
UIBC: 294 ug/dL

## 2021-10-15 LAB — VITAMIN B12: Vitamin B-12: 344 pg/mL (ref 180–914)

## 2021-10-15 LAB — AMYLASE: Amylase: 629 U/L — ABNORMAL HIGH (ref 28–100)

## 2021-10-15 LAB — MAGNESIUM: Magnesium: 1.8 mg/dL (ref 1.7–2.4)

## 2021-10-15 LAB — VITAMIN D 25 HYDROXY (VIT D DEFICIENCY, FRACTURES): Vit D, 25-Hydroxy: 57.48 ng/mL (ref 30–100)

## 2021-10-16 ENCOUNTER — Telehealth: Payer: Self-pay | Admitting: Obstetrics and Gynecology

## 2021-10-16 ENCOUNTER — Encounter: Payer: Self-pay | Admitting: Internal Medicine

## 2021-10-16 NOTE — Telephone Encounter (Signed)
Pt is scheduled for Mirena removal and reinsertion on 12/7 with [facility].

## 2021-10-20 ENCOUNTER — Telehealth: Payer: Self-pay

## 2021-10-20 ENCOUNTER — Encounter: Payer: Self-pay | Admitting: Internal Medicine

## 2021-10-20 LAB — VITAMIN B1: Vitamin B1 (Thiamine): 123.9 nmol/L (ref 66.5–200.0)

## 2021-10-20 NOTE — Telephone Encounter (Signed)
Noted. Will order to arrive by apt date/time. 

## 2021-10-20 NOTE — Telephone Encounter (Signed)
Completed insurance physicians results form and faxed to [phone]

## 2021-10-21 ENCOUNTER — Telehealth: Payer: Self-pay

## 2021-10-21 NOTE — Telephone Encounter (Signed)
Spoke to pt and she advised she has never had a ABG done.  Stated that when she was having her labs done her daughter Lovena Le advised her blood was very dark looking and she stated that she asked lab tech and he told her to let her dr know.  I spoke to [facility] and informed her of what pt's concern was and she is ordering a ABG lab to be done.  I faxed the order to the [facility] hospital lab for the ABG lab

## 2021-10-22 ENCOUNTER — Other Ambulatory Visit: Payer: Self-pay | Admitting: Internal Medicine

## 2021-10-22 DIAGNOSIS — K269 Duodenal ulcer, unspecified as acute or chronic, without hemorrhage or perforation: Secondary | ICD-10-CM

## 2021-10-23 ENCOUNTER — Telehealth: Payer: Self-pay

## 2021-10-23 NOTE — Telephone Encounter (Signed)
Pt called c/o not able to sleep due to her oxygen dropping to 85.  Pt advised she has her Cpap on with oxygen and she had to turn her oxygen up to 4 liters.  Pt advised that she is sleeping setting up in her bed (adjustable bed).  I spoke to [facility] and she advised with pt's sx's that she should go to the ED.  Sent pt a mychart message advising to go to ED

## 2021-10-26 ENCOUNTER — Other Ambulatory Visit: Payer: Self-pay

## 2021-10-26 ENCOUNTER — Encounter: Payer: Self-pay | Admitting: Internal Medicine

## 2021-10-26 ENCOUNTER — Other Ambulatory Visit: Payer: Self-pay | Admitting: Internal Medicine

## 2021-10-26 ENCOUNTER — Ambulatory Visit: Payer: 59 | Attending: Internal Medicine

## 2021-10-26 ENCOUNTER — Telehealth: Payer: Self-pay

## 2021-10-26 DIAGNOSIS — R0902 Hypoxemia: Secondary | ICD-10-CM

## 2021-10-26 DIAGNOSIS — I731 Thromboangiitis obliterans [Buerger's disease]: Secondary | ICD-10-CM

## 2021-10-26 LAB — BLOOD GAS, ARTERIAL
Acid-Base Excess: 0.9 mmol/L (ref 0.0–2.0)
Bicarbonate: 25.3 mmol/L (ref 20.0–28.0)
FIO2: 0.21
O2 Saturation: 95.8 %
Patient temperature: 37
pCO2 arterial: 39 mmHg (ref 32.0–48.0)
pH, Arterial: 7.42 (ref 7.350–7.450)
pO2, Arterial: 79 mmHg — ABNORMAL LOW (ref 83.0–108.0)

## 2021-10-26 MED ORDER — PULSE OXIMETER MISC: 3 refills | Status: DC

## 2021-10-26 NOTE — Telephone Encounter (Signed)
Pt called requesting the lab order for her blood gas be placed in epic.  I had recently faxed the order to [name] but pt requested that we put in epic die to them not receiving it.    I also informed pt that I emailed her the copy of her physicians results form and I also faxed it again to [phone]

## 2021-11-09 ENCOUNTER — Other Ambulatory Visit: Payer: Self-pay

## 2021-11-09 ENCOUNTER — Encounter: Payer: Self-pay | Admitting: Internal Medicine

## 2021-11-09 MED ORDER — LEVOTHYROXINE SODIUM 200 MCG PO TABS: 200.0000 ug | ORAL_TABLET | Freq: Every day | ORAL | 3 refills | Status: DC

## 2021-11-09 NOTE — Telephone Encounter (Signed)
Please send a new rx for 200 mcg for pt

## 2021-11-11 ENCOUNTER — Encounter: Payer: Self-pay | Admitting: Obstetrics and Gynecology

## 2021-11-13 NOTE — Telephone Encounter (Addendum)
Not due for exchange d/t 6 year indication. Mirena not received.

## 2021-11-13 NOTE — Telephone Encounter (Signed)
Yeah good for 8 years now also she is under the impression we still have in house ultrasound which we don't

## 2021-11-17 NOTE — Telephone Encounter (Signed)
Mirena reserved for this patient.

## 2021-11-18 ENCOUNTER — Encounter: Payer: Self-pay | Admitting: Obstetrics and Gynecology

## 2021-11-18 ENCOUNTER — Ambulatory Visit (INDEPENDENT_AMBULATORY_CARE_PROVIDER_SITE_OTHER): Payer: 59 | Admitting: Obstetrics and Gynecology

## 2021-11-18 ENCOUNTER — Ambulatory Visit: Payer: 59 | Admitting: Obstetrics and Gynecology

## 2021-11-18 ENCOUNTER — Other Ambulatory Visit: Payer: Self-pay

## 2021-11-18 VITALS — BP 120/72 | HR 82

## 2021-11-18 DIAGNOSIS — Z30431 Encounter for routine checking of intrauterine contraceptive device: Secondary | ICD-10-CM

## 2021-11-18 DIAGNOSIS — R300 Dysuria: Secondary | ICD-10-CM | POA: Diagnosis not present

## 2021-11-18 DIAGNOSIS — Z01419 Encounter for gynecological examination (general) (routine) without abnormal findings: Secondary | ICD-10-CM | POA: Diagnosis not present

## 2021-11-18 DIAGNOSIS — Z1231 Encounter for screening mammogram for malignant neoplasm of breast: Secondary | ICD-10-CM | POA: Diagnosis not present

## 2021-11-18 DIAGNOSIS — N8501 Benign endometrial hyperplasia: Secondary | ICD-10-CM | POA: Diagnosis not present

## 2021-11-18 LAB — POCT URINALYSIS DIPSTICK
Bilirubin, UA: NEGATIVE
Glucose, UA: NEGATIVE
Ketones, UA: NEGATIVE
Nitrite, UA: NEGATIVE
Protein, UA: NEGATIVE
Spec Grav, UA: 1.01 (ref 1.010–1.025)
Urobilinogen, UA: 0.2 E.U./dL
pH, UA: 6.5 (ref 5.0–8.0)

## 2021-11-18 MED ORDER — UROGESIC-BLUE 81.6 MG PO TABS: 1.0000 | ORAL_TABLET | Freq: Four times a day (QID) | ORAL | 3 refills | Status: AC

## 2021-11-18 NOTE — Addendum Note (Signed)
Addended by: Tamala Julian R on: 11/18/2021 02:40 PM   Modules accepted: Orders

## 2021-11-18 NOTE — Patient Instructions (Signed)
[facility] [address] [place] [address]  [facility]  [address] [place] [address]  Phone: [phone]  

## 2021-11-18 NOTE — Progress Notes (Signed)
Gynecology Annual Exam  PCP: Lavera Guise, MD  Chief Complaint:  Chief Complaint  Patient presents with   Gynecologic Exam    Annual - Procedure RM     History of Present Illness:Patient is a 55 y.o. [name] presents for annual exam. The patient has no complaints today.   LMP: No LMP recorded. (Menstrual status: IUD).  The patient is sexually active. She denies dyspareunia.  The patient does perform self breast exams.  There is no notable family history of breast or ovarian cancer in her family.  The patient wears seatbelts: yes.   The patient has regular exercise: not asked.    The patient denies current symptoms of depression.     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   Obstructive sleep apnea 08/13/2020   Chest pain 08/13/2020   Hypothyroidism due to Hashimoto's thyroiditis 08/13/2020   Gastroesophageal reflux disease 07/21/2020   Elevated amylase 04/06/2020   Generalized abdominal pain 03/23/2020   S/P total colectomy 03/23/2020   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    Hypothyroid 05/11/2013   Essential hypertension 05/11/2013    Overview:  On medications     Past Surgical History:  Past Surgical History:  Procedure Laterality Date   ADENOIDECTOMY     Small child   APPENDECTOMY     Age 49   COLON SURGERY  1980   ileostomy and internal pouch age 32   CORONARY/GRAFT ACUTE MI REVASCULARIZATION N/A 06/12/2021   Procedure: Coronary/Graft Acute MI Revascularization;  Surgeon: Isaias Cowman, MD;  Location: [facility];  Service: Cardiovascular;  Laterality: N/A;   DILATATION & CURETTAGE/HYSTEROSCOPY WITH MYOSURE N/A 05/02/2015   Procedure: DILATATION & CURETTAGE/HYSTEROSCOPY WITH MYOSURE;  Surgeon: Malachy Mood, MD;  Location: [facility];  Service: Gynecology;  Laterality: N/A;   LAPAROSCOPIC GASTRIC SLEEVE RESECTION  07/06/2016   LEFT HEART CATH AND CORONARY ANGIOGRAPHY N/A 06/12/2021   Procedure: LEFT HEART CATH AND CORONARY ANGIOGRAPHY;  Surgeon: Isaias Cowman, MD;  Location: [facility];  Service: Cardiovascular;  Laterality: N/A;    Gynecologic History:  No LMP recorded. (Menstrual status: IUD). Last Pap: Results were: 05/18/2020 NIL and HR HPV negative  Last mammogram: 05/08/2020 Results were: BI-RAD I  Obstetric History: [id number]  Family History:  Family History  Problem Relation Age of Onset   Clotting disorder Mother    Hypertension Mother    Congestive Heart Failure Father    Hypertension Father    Stroke Sister  Rheum arthritis Sister    Congestive Heart Failure Maternal Grandmother    Colon cancer Maternal Grandfather 16   Skin cancer Maternal Aunt        Basal cell   Skin cancer Maternal Uncle        Basal cell   Skin cancer Cousin        Basal Cell    Social History:  Social History   Socioeconomic History   Marital status: Married    Spouse name: Not on file   Number of  children: Not on file   Years of education: Not on file   Highest education level: Not on file  Occupational History   Not on file  Tobacco Use   Smoking status: Former    Types: Cigarettes    Quit date: 04/28/2010    Years since quitting: 11.5   Smokeless tobacco: Never  Substance and Sexual Activity   Alcohol use: No    Alcohol/week: 0.0 standard drinks   Drug use: No   Sexual activity: Yes    Birth control/protection: None, I.U.D.  Other Topics Concern   Not on file  Social History Narrative   Not on file   Social Determinants of Health   Financial Resource Strain: Not on file  Food Insecurity: Not on file  Transportation Needs: Not on file  Physical Activity: Not on file  Stress: Not on file  Social Connections: Not on file  Intimate Partner Violence: Not on file    Allergies:  Allergies  Allergen Reactions   Yeast-Related Products    Tape Rash    Some tapes cause a rash, possible the paper tape.    Medications: Prior to Admission medications   Medication Sig Start Date End Date Taking? Authorizing Provider  levothyroxine (SYNTHROID) 200 MCG tablet Take 1 tablet (200 mcg total) by mouth daily. 11/09/21   Lavera Guise, MD  amLODipine (NORVASC) 10 MG tablet TAKE 1 TABLET BY MOUTH EVERYDAY AT BEDTIME 10/11/21   Lavera Guise, MD  Cranberry 1000 MG CAPS Take 1 tablet by mouth daily.    [provider]  [facility] ASPIRIN LOW STRENGTH 81 MG EC tablet Take 2 tablets (162 mg total) by mouth daily. Swallow whole. 08/20/21   Lavera Guise, MD  famotidine (PEPCID) 20 MG tablet TAKE 1 TABLET BY MOUTH TWICE A DAY 08/18/21   Lavera Guise, MD  folic acid (FOLVITE) 1 MG tablet Take 1 tablet (1 mg total) by mouth daily. 05/14/21   Lavera Guise, MD  levonorgestrel [facility]) 20 MCG/24HR IUD by Intrauterine route.    [provider]  Misc. Devices (PULSE OXIMETER) MISC Use as directed on finger to check oxygen level daily  DX: I73.1 10/26/21   Lavera Guise, MD   pantoprazole (PROTONIX) 40 MG tablet TAKE 1 TABLET BY MOUTH TWICE A DAY 10/22/21   Lavera Guise, MD  Pediatric Multiple Vitamins (MULTIVITAMIN CHILDRENS PO) Take by mouth. Flinstones complete generic version    [provider]  potassium chloride (KLOR-CON) 10 MEQ tablet Take 1 tablet (10 mEq total) by mouth daily. Patient taking differently: Take 10 mEq by mouth daily. Takes every other day 07/06/21   Loletha Grayer, MD  Probiotic Product (PROBIOTIC ADVANCED PO) Take 1 capsule by mouth daily.    [provider]  Vitamin D, Ergocalciferol, (DRISDOL) 1.25 MG (50000 UNIT) CAPS capsule TAKE 1 CAPSULE ONCE A WEEK 05/14/21   Lavera Guise, MD    Physical Exam Vitals:  Blood pressure 120/72, pulse 82.  General: NAD HEENT: normocephalic, anicteric Pulmonary: No increased work of breathing, CTAB Cardiovascular: RRR, distal pulses 2+ Abdomen: NABS, soft, non-tender, non-distended.  Umbilicus without lesions.  No hepatomegaly, splenomegaly or masses palpable. No evidence of hernia  Genitourinary:  External: Normal external female genitalia.  Normal urethral meatus, normal Bartholin's and Skene's glands.    Vagina: Normal vaginal mucosa, no evidence of prolapse.    Cervix: Grossly normal in appearance, no bleeding  Uterus: Non-enlarged, mobile, normal contour.  No CMT  Adnexa: ovaries non-enlarged, no adnexal masses  Rectal: deferred  Lymphatic: no evidence of inguinal lymphadenopathy Extremities: no edema, erythema, or tenderness Neurologic: Grossly intact Psychiatric: mood appropriate, affect full  Female chaperone present for pelvic and breast  portions of the physical exam     Assessment: 55 y.o. G2P2002 routine annual exam  Plan: Problem List Items Addressed This Visit   None Visit Diagnoses     Endometrial hyperplasia without atypia, simple    -  Primary   Relevant Orders   Follicle stimulating hormone   Estradiol   Breast cancer screening by mammogram        Relevant Orders   MM 3D SCREEN BREAST BILATERAL   Encounter for gynecological examination without abnormal finding       IUD check up       Relevant Orders   Follicle stimulating hormone   Estradiol   Dysuria       Relevant Orders   Urine Culture       1) Mammogram - recommend yearly screening mammogram.  Mammogram Was ordered today  2) STI screening  was notoffered and therefore not obtained  3) ASCCP guidelines and rational discussed.  Patient opts for every 3 years screening interval  4) Osteoporosis  - per USPTF routine screening DEXA at age 65  5) Routine healthcare maintenance including cholesterol, diabetes screening discussed managed by PCP  6) Simple hyperplasia without atypia 2016 - has been managed with Mirena IUD since and doing well follow up biopsies shows reverals of hyperplasia changes.  Will check FSH/estradiol level again.  We discussed discontinuation of IUD after 8 years with interval 6 months ultrasound to evaluate endometrial thickness vs replacement for benefit of endometrial protection  7) No follow-ups on file.    Malachy Mood, MD Mosetta Pigeon, [facility] 11/18/2021, 1:41 PM

## 2021-11-18 NOTE — Telephone Encounter (Signed)
11/18/21-Mirena not rcvd Simple hyperplasia without atypia 2016 - has been managed with Mirena IUD since and doing well follow up biopsies shows reverals of hyperplasia changes.  Will check FSH/estradiol level again.  We discussed discontinuation of IUD after 8 years with interval 6 months ultrasound to evaluate endometrial thickness vs replacement for benefit of endometrial protection

## 2021-11-19 ENCOUNTER — Encounter: Payer: Self-pay | Admitting: Obstetrics and Gynecology

## 2021-11-19 LAB — ESTRADIOL: Estradiol: 29.1 pg/mL

## 2021-11-19 LAB — FOLLICLE STIMULATING HORMONE: FSH: 47 m[IU]/mL

## 2021-11-21 LAB — URINE CULTURE

## 2021-11-25 ENCOUNTER — Encounter: Payer: Self-pay | Admitting: Internal Medicine

## 2021-11-25 ENCOUNTER — Encounter: Payer: Self-pay | Admitting: Obstetrics and Gynecology

## 2021-11-30 ENCOUNTER — Telehealth: Payer: Self-pay

## 2021-11-30 ENCOUNTER — Other Ambulatory Visit: Payer: Self-pay | Admitting: Internal Medicine

## 2021-11-30 ENCOUNTER — Encounter: Payer: Self-pay | Admitting: Physician Assistant

## 2021-11-30 DIAGNOSIS — K219 Gastro-esophageal reflux disease without esophagitis: Secondary | ICD-10-CM

## 2021-11-30 NOTE — Telephone Encounter (Signed)
Pt advised  faxed [facility] for continent stoma catheter

## 2021-12-02 ENCOUNTER — Encounter: Payer: Self-pay | Admitting: Internal Medicine

## 2021-12-03 ENCOUNTER — Telehealth: Payer: Self-pay

## 2021-12-03 ENCOUNTER — Encounter: Payer: Self-pay | Admitting: Internal Medicine

## 2021-12-03 NOTE — Telephone Encounter (Signed)
Disability parking placard signed by provider and placed in mail for patient to pick up.

## 2021-12-14 ENCOUNTER — Encounter: Payer: Self-pay | Admitting: Physician Assistant

## 2021-12-18 ENCOUNTER — Encounter: Payer: Self-pay | Admitting: Internal Medicine

## 2021-12-24 ENCOUNTER — Encounter: Payer: Self-pay | Admitting: Internal Medicine

## 2021-12-29 NOTE — Telephone Encounter (Signed)
It needs to be reviwed and discussed by ordering physician

## 2022-01-03 ENCOUNTER — Encounter: Payer: Self-pay | Admitting: Emergency Medicine

## 2022-01-03 ENCOUNTER — Other Ambulatory Visit: Payer: Self-pay

## 2022-01-03 DIAGNOSIS — E039 Hypothyroidism, unspecified: Secondary | ICD-10-CM | POA: Insufficient documentation

## 2022-01-03 DIAGNOSIS — R195 Other fecal abnormalities: Secondary | ICD-10-CM | POA: Diagnosis present

## 2022-01-03 DIAGNOSIS — I10 Essential (primary) hypertension: Secondary | ICD-10-CM | POA: Insufficient documentation

## 2022-01-03 LAB — COMPREHENSIVE METABOLIC PANEL
ALT: 28 U/L (ref 0–44)
AST: 28 U/L (ref 15–41)
Albumin: 4.5 g/dL (ref 3.5–5.0)
Alkaline Phosphatase: 96 U/L (ref 38–126)
Anion gap: 12 (ref 5–15)
BUN: 21 mg/dL — ABNORMAL HIGH (ref 6–20)
CO2: 23 mmol/L (ref 22–32)
Calcium: 9.4 mg/dL (ref 8.9–10.3)
Chloride: 102 mmol/L (ref 98–111)
Creatinine, Ser: 0.85 mg/dL (ref 0.44–1.00)
GFR, Estimated: >60 mL/min (ref >60)
Glucose, Bld: 104 mg/dL — ABNORMAL HIGH (ref 70–99)
Potassium: 3.7 mmol/L (ref 3.5–5.1)
Sodium: 137 mmol/L (ref 135–145)
Total Bilirubin: 0.8 mg/dL (ref 0.3–1.2)
Total Protein: 9 g/dL — ABNORMAL HIGH (ref 6.5–8.1)

## 2022-01-03 LAB — CBC WITH DIFFERENTIAL/PLATELET
Abs Immature Granulocytes: 0.09 10*3/uL — ABNORMAL HIGH (ref 0.00–0.07)
Basophils Absolute: 0 10*3/uL (ref 0.0–0.1)
Basophils Relative: 0 %
Eosinophils Absolute: 0 10*3/uL (ref 0.0–0.5)
Eosinophils Relative: 0 %
HCT: 48.3 % — ABNORMAL HIGH (ref 36.0–46.0)
Hemoglobin: 15.9 g/dL — ABNORMAL HIGH (ref 12.0–15.0)
Immature Granulocytes: 1 %
Lymphocytes Relative: 15 %
Lymphs Abs: 1.9 10*3/uL (ref 0.7–4.0)
MCH: 30.8 pg (ref 26.0–34.0)
MCHC: 32.9 g/dL (ref 30.0–36.0)
MCV: 93.4 fL (ref 80.0–100.0)
Monocytes Absolute: 0.5 10*3/uL (ref 0.1–1.0)
Monocytes Relative: 4 %
Neutro Abs: 10.2 10*3/uL — ABNORMAL HIGH (ref 1.7–7.7)
Neutrophils Relative %: 80 %
Platelets: 316 10*3/uL (ref 150–400)
RBC: 5.17 MIL/uL — ABNORMAL HIGH (ref 3.87–5.11)
RDW: 14.4 % (ref 11.5–15.5)
WBC: 12.8 10*3/uL — ABNORMAL HIGH (ref 4.0–10.5)
nRBC: 0 % (ref 0.0–0.2)

## 2022-01-03 LAB — PHOSPHORUS: Phosphorus: 2.6 mg/dL (ref 2.5–4.6)

## 2022-01-03 NOTE — ED Triage Notes (Signed)
Pt reports she was started on 3 medications (Ivermectin 12mg  caps 3 capsules daily; prednisone 5mg   tapered; Nattokinase 100mg ; 1 cap daily) x 3 days ago after having palpitations and other cardiac symptoms in response to COVID vaccine in 2021; pt reports she was started on furosemide 20mg  1 tab daily on 12/26/2021); pt reports she has an internal BCIR pouch and has had watery diarrhea, tachycardia and smoke in her pouch starting today

## 2022-01-04 ENCOUNTER — Emergency Department
Admission: EM | Admit: 2022-01-04 | Discharge: 2022-01-04 | Disposition: A | Discharge status: Home / Self Care | Payer: Commercial Managed Care - HMO | Attending: Emergency Medicine | Admitting: Emergency Medicine

## 2022-01-04 DIAGNOSIS — R195 Other fecal abnormalities: Secondary | ICD-10-CM

## 2022-01-04 NOTE — ED Provider Notes (Signed)
[facility] Provider Note    Event Date/Time   First MD Initiated Contact with Patient 01/04/22 0020     (approximate)   History   Medication Reaction   HPI  [name] is a 56 y.o. female with a history of BCIR pouch who presents for evaluation of watery diarrhea, tachycardia, and smoking her pouch.  Patient reports that she saw a new doctor earlier this week that started her on ivermectin, Nattokinase and prednisone after learning from the patient that she had had several complications from the COVID-vaccine since 2021.  She reports that today she was emptying her pouch when she noticed a large amount of smoke coming out of it.  She cool did she was concerned about a phosphate intoxication.  She denies taking any phosphate supplementation.  She denies ever having any similar symptoms.  She does report having diarrhea most of the day today but she also has recently changed her diet to a keto diet and that is not atypical for her.  She denies any abdominal pain.  She reports that when this episode happened she noticed that her heart rate was going up.  She denies any chest pain or shortness of breath, dizziness or lightheadedness.  She has not emptied her pouch since.     Past Medical History:  Diagnosis Date   Berger's disease 2011   Buerger's disease ([place])    Family history of adverse reaction to anesthesia    sister and daughter difficult to wake up   Folic acid deficiency    GERD (gastroesophageal reflux disease)    Hypertension    Hypothyroidism    IDA (iron deficiency anemia)    Osteoarthritis    PVC's (premature ventricular contractions)    Thrombocytosis     Past Surgical History:  Procedure Laterality Date   ADENOIDECTOMY     Small child   APPENDECTOMY     Age 86   COLON SURGERY  1980   ileostomy and internal pouch age 60   CORONARY/GRAFT ACUTE MI REVASCULARIZATION N/A 06/12/2021   Procedure: Coronary/Graft Acute MI Revascularization;   Surgeon: Isaias Cowman, MD;  Location: [facility];  Service: Cardiovascular;  Laterality: N/A;   DILATATION & CURETTAGE/HYSTEROSCOPY WITH MYOSURE N/A 05/02/2015   Procedure: DILATATION & CURETTAGE/HYSTEROSCOPY WITH MYOSURE;  Surgeon: Malachy Mood, MD;  Location: [facility];  Service: Gynecology;  Laterality: N/A;   LAPAROSCOPIC GASTRIC SLEEVE RESECTION  07/06/2016   LEFT HEART CATH AND CORONARY ANGIOGRAPHY N/A 06/12/2021   Procedure: LEFT HEART CATH AND CORONARY ANGIOGRAPHY;  Surgeon: Isaias Cowman, MD;  Location: [facility];  Service: Cardiovascular;  Laterality: N/A;     Physical Exam   Triage Vital Signs: ED Triage Vitals  Enc Vitals Group     BP 01/03/22 2213 139/76     Pulse Rate 01/03/22 2213 88     Resp 01/03/22 2213 17     Temp 01/03/22 2213 98.1 F (36.7 C)     Temp Source 01/03/22 2213 Oral     SpO2 01/03/22 2213 100 %     Weight 01/03/22 2217 (!) 341 lb 8 oz (154.9 kg)     Height 01/03/22 2217 5' 7.5" (1.715 m)     Head Circumference --      Peak Flow --      Pain Score 01/03/22 2217 0     Pain Loc --      Pain Edu? --      Excl. in  GC? --     Most recent vital signs: Vitals:   01/04/22 0024 01/04/22 0030  BP: (!) 129/55 (!) 111/59  Pulse: 89 90  Resp: 19 19  Temp:    SpO2: 100% 100%     Constitutional: Alert and oriented. Well appearing and in no apparent distress. HEENT:      Head: Normocephalic and atraumatic.         Eyes: Conjunctivae are normal. Sclera is non-icteric.       Mouth/Throat: Mucous membranes are moist.       Neck: Supple with no signs of meningismus. Cardiovascular: Regular rate and rhythm. No murmurs, gallops, or rubs. 2+ symmetrical distal pulses are present in all extremities.  Respiratory: Normal respiratory effort. Lungs are clear to auscultation bilaterally.  Gastrointestinal: Soft, non tender, and non distended with positive bowel sounds. No rebound or guarding.  Patient empty her pouch in front of  me and had soft stool with no signs of smoke or blood Genitourinary: No CVA tenderness. Musculoskeletal:  No edema, cyanosis, or erythema of extremities. Neurologic: Normal speech and language. Face is symmetric. Moving all extremities. No gross focal neurologic deficits are appreciated. Skin: Skin is warm, dry and intact. No rash noted. Psychiatric: Mood and affect are normal. Speech and behavior are normal.  ED Results / Procedures / Treatments   Labs (all labs ordered are listed, but only abnormal results are displayed) Labs Reviewed  CBC WITH DIFFERENTIAL/PLATELET - Abnormal; Notable for the following components:      Result Value   WBC 12.8 (*)    RBC 5.17 (*)    Hemoglobin 15.9 (*)    HCT 48.3 (*)    Neutro Abs 10.2 (*)    Abs Immature Granulocytes 0.09 (*)    All other components within normal limits  COMPREHENSIVE METABOLIC PANEL - Abnormal; Notable for the following components:   Glucose, Bld 104 (*)    BUN 21 (*)    Total Protein 9.0 (*)    All other components within normal limits  PHOSPHORUS     EKG  ED ECG REPORT I, Rudene Re, the attending physician, personally viewed and interpreted this ECG.  Sinus rhythm with a rate of 82, normal intervals, normal axis, no ST elevations or depressions.  Normal EKG peer   RADIOLOGY none   PROCEDURES:  Critical Care performed: No  Procedures    IMPRESSION / MDM / ASSESSMENT AND PLAN / ED COURSE  I reviewed the triage vital signs and the nursing notes.  56 y.o. female with a history of BCIR pouch who presents for evaluation of watery diarrhea, tachycardia, and smoking her pouch.  Patient is well-appearing in no distress with normal vital signs.  Abdomen soft and nontender.  Patient did empty her pouch here in the ER in front of me with some soft stool and no signs of smoke or blood or any other abnormalities  Ddx: Unclear if this could be a side effect of any of these new medications that she was started  on.     Plan: CBC, CMP, phosphate level, EKG   MEDICATIONS GIVEN IN ED: Medications - No data to display   ED COURSE: Patient slightly elevated white count of 12.8 which is nonspecific.  Normal hemoglobin.  Normal phosphorus, calcium, potassium, LFTs, creatinine.  Patient empty her pouch in front of me in the ED with full resolution of the smoke.  Her EKG was done and shows no signs of any irregularities.  Unclear if these  could be side effects of these new medications that she was started on by her doctor.  Some of them do not even have information in any of the reliable medical websites for consultation.  Admission was considered but with full resolve symptoms and normal labs I felt the patient was safe for discharge home.  Recommended that she calls her doctor in the morning to discuss possible side effects and stopping these medications.  Discussed my standard return precautions.   Consults: None   EMR reviewed including last visit with her cardiologist from 2 weeks ago for which she was seen for dyspnea on exertion and hypertension       FINAL CLINICAL IMPRESSION(S) / ED DIAGNOSES   Final diagnoses:  Abnormal findings in stool     Rx / DC Orders   ED Discharge Orders     None        Note:  This document was prepared using Dragon voice recognition software and may include unintentional dictation errors.   Alfred Levins, [place], MD 01/04/22 [phone]

## 2022-01-04 NOTE — ED Notes (Signed)
Pt denying any chest pain or SOB at this time. Pt denies pain as well.

## 2022-01-06 ENCOUNTER — Ambulatory Visit
Admission: RE | Admit: 2022-01-06 | Discharge: 2022-01-06 | Disposition: A | Discharge status: Home / Self Care | Payer: Commercial Managed Care - HMO | Source: Ambulatory Visit | Attending: Obstetrics and Gynecology | Admitting: Obstetrics and Gynecology

## 2022-01-06 ENCOUNTER — Other Ambulatory Visit: Payer: Self-pay

## 2022-01-06 DIAGNOSIS — Z1231 Encounter for screening mammogram for malignant neoplasm of breast: Secondary | ICD-10-CM | POA: Diagnosis present

## 2022-02-02 ENCOUNTER — Other Ambulatory Visit: Payer: Self-pay | Admitting: Internal Medicine

## 2022-02-02 DIAGNOSIS — E559 Vitamin D deficiency, unspecified: Secondary | ICD-10-CM

## 2022-03-08 IMAGING — CR DG CHEST 2V
1 series · 2 of 2 positions shown · non-contrast
Comparison: CTA chest and chest x-ray dated June 11, 2021.

CLINICAL DATA: Chest pain and palpitations.

EXAM:
CHEST - 2 VIEW

[Series 1: dg chest 2 view · 0.14mm/px · 2 of 2 slices shown]
[im 1/2]
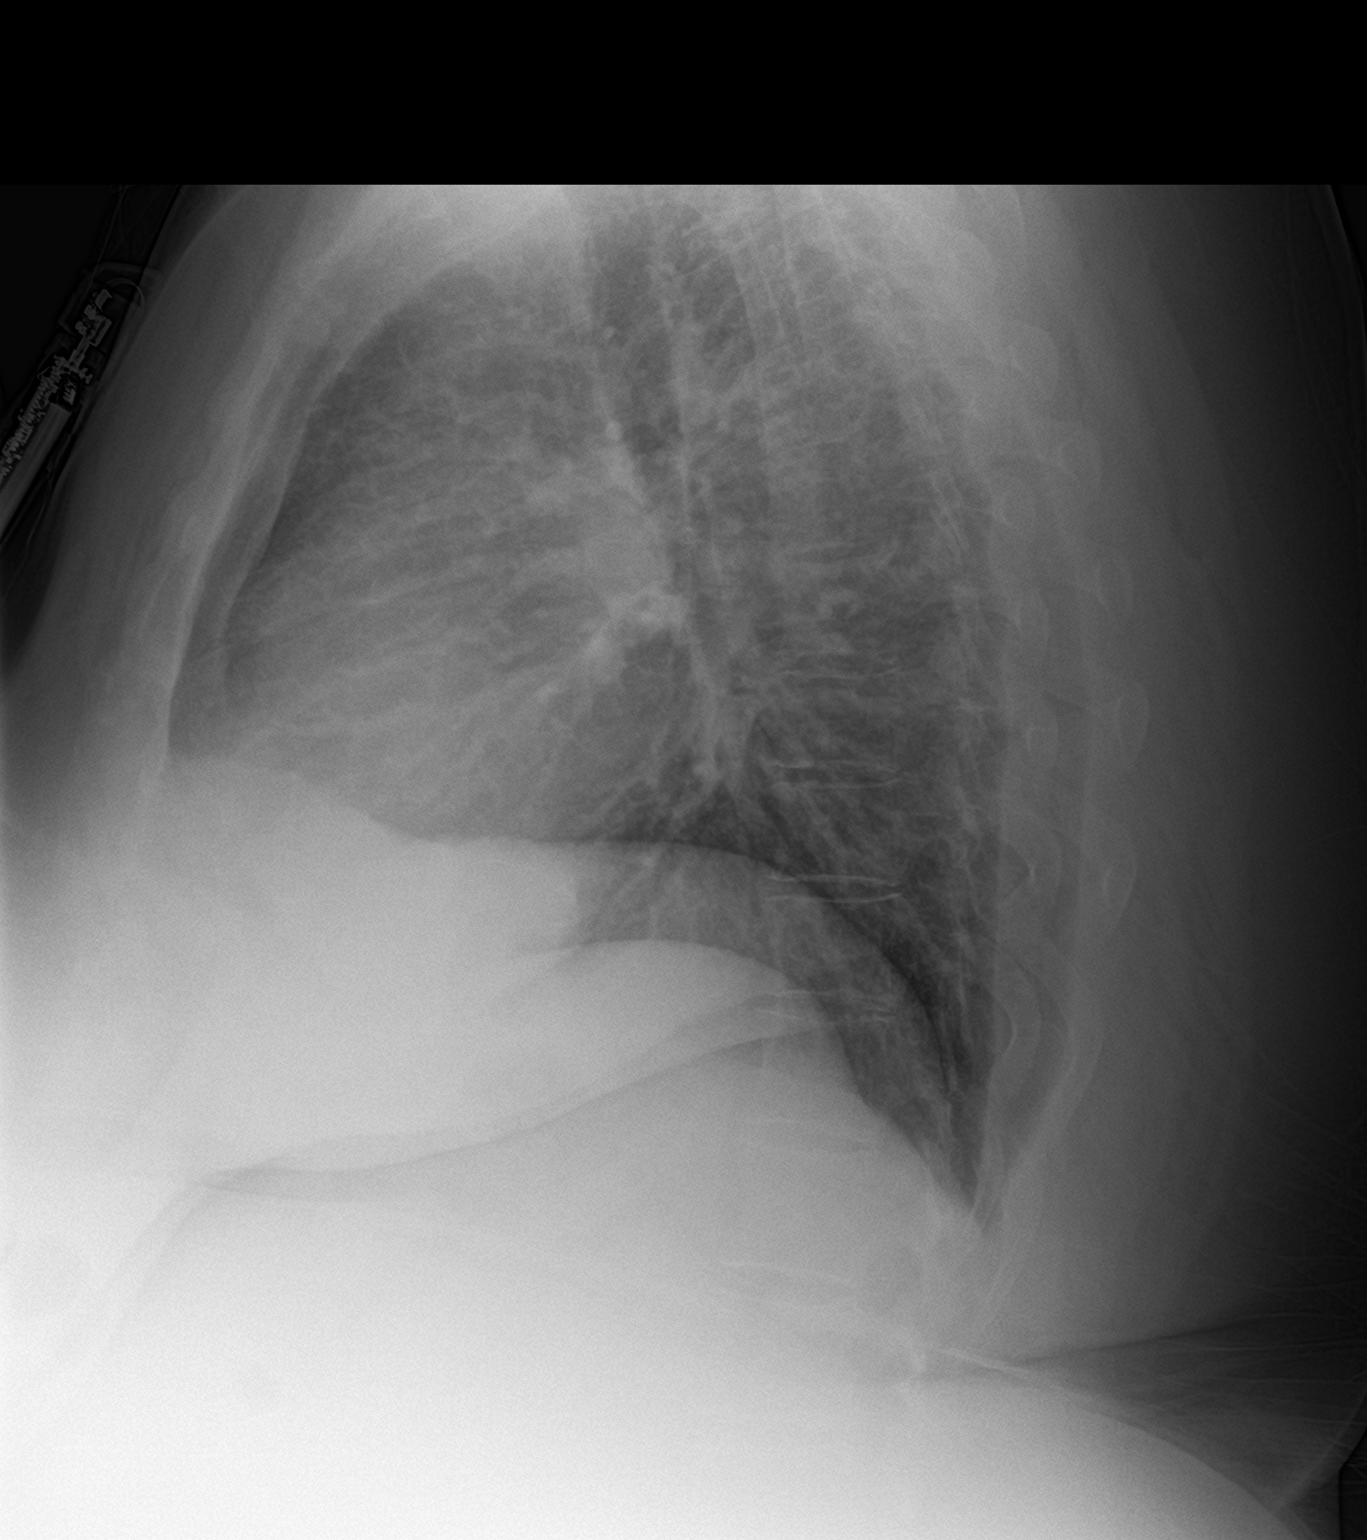
[im 2/2]
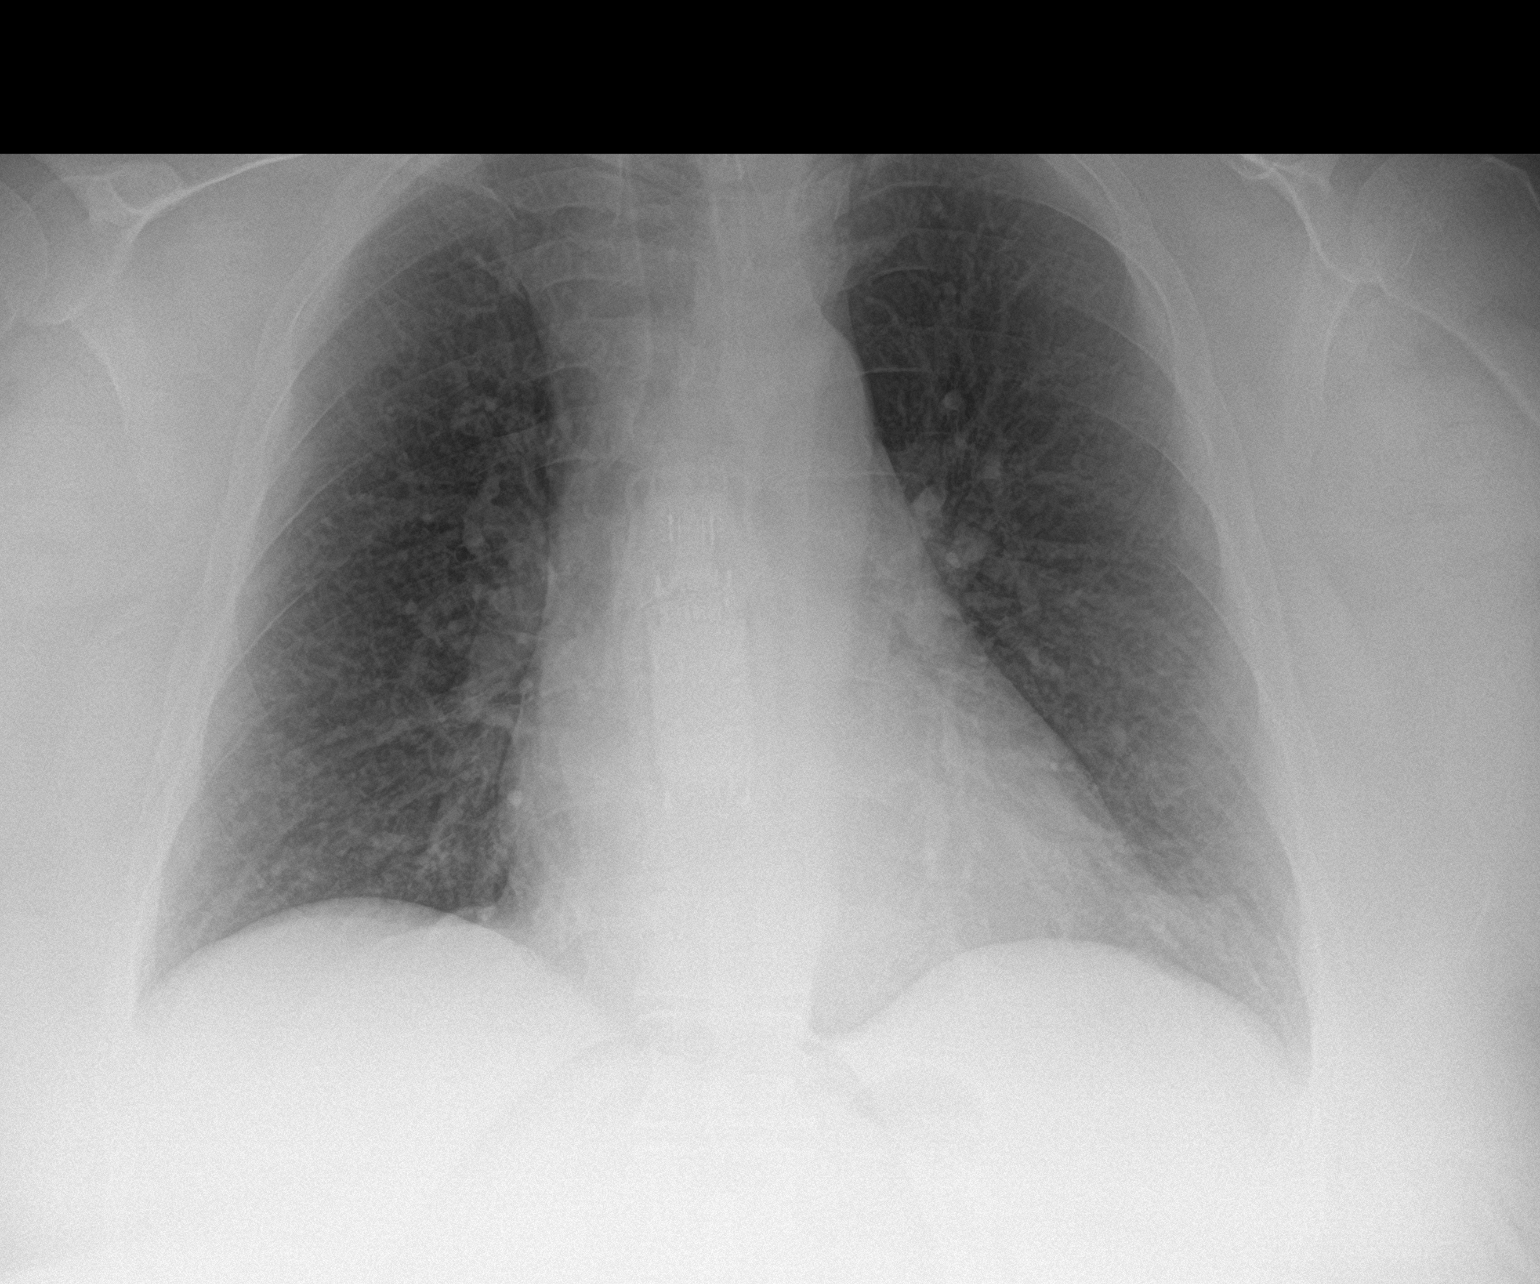

[2 of 2 positions shown; findings below may reference images not displayed]

FINDINGS: Stable cardiomediastinal silhouette with borderline cardiomegaly.
Normal pulmonary vascularity. No focal consolidation, pleural
effusion, or pneumothorax. No acute osseous abnormality.
IMPRESSION: No active cardiopulmonary disease.

## 2022-03-10 ENCOUNTER — Encounter: Payer: Self-pay | Admitting: Internal Medicine

## 2022-03-16 NOTE — Telephone Encounter (Signed)
Spoke with pt that as per dfk pt need keep upcoming  appt with lauren  and next follow up we can make with DFK  ?

## 2022-03-22 ENCOUNTER — Ambulatory Visit: Payer: Managed Care, Other (non HMO) | Admitting: Physician Assistant

## 2022-04-04 ENCOUNTER — Other Ambulatory Visit: Payer: Self-pay | Admitting: Internal Medicine

## 2022-04-04 DIAGNOSIS — Z1231 Encounter for screening mammogram for malignant neoplasm of breast: Secondary | ICD-10-CM

## 2022-04-04 DIAGNOSIS — R300 Dysuria: Secondary | ICD-10-CM

## 2022-04-04 DIAGNOSIS — E038 Other specified hypothyroidism: Secondary | ICD-10-CM

## 2022-04-04 DIAGNOSIS — G8929 Other chronic pain: Secondary | ICD-10-CM

## 2022-04-04 DIAGNOSIS — I4729 Other ventricular tachycardia: Secondary | ICD-10-CM

## 2022-04-04 DIAGNOSIS — I10 Essential (primary) hypertension: Secondary | ICD-10-CM

## 2022-04-04 DIAGNOSIS — Z0001 Encounter for general adult medical examination with abnormal findings: Secondary | ICD-10-CM

## 2022-04-04 DIAGNOSIS — K6389 Other specified diseases of intestine: Secondary | ICD-10-CM

## 2022-04-04 DIAGNOSIS — G4733 Obstructive sleep apnea (adult) (pediatric): Secondary | ICD-10-CM

## 2022-04-05 ENCOUNTER — Encounter: Payer: Self-pay | Admitting: Physician Assistant

## 2022-04-05 ENCOUNTER — Ambulatory Visit: Payer: Managed Care, Other (non HMO) | Admitting: Physician Assistant

## 2022-04-05 DIAGNOSIS — I10 Essential (primary) hypertension: Secondary | ICD-10-CM | POA: Diagnosis not present

## 2022-04-05 DIAGNOSIS — E782 Mixed hyperlipidemia: Secondary | ICD-10-CM | POA: Diagnosis not present

## 2022-04-05 DIAGNOSIS — K638219 Small intestinal bacterial overgrowth, unspecified: Secondary | ICD-10-CM

## 2022-04-05 DIAGNOSIS — K6389 Other specified diseases of intestine: Secondary | ICD-10-CM | POA: Diagnosis not present

## 2022-04-05 DIAGNOSIS — E039 Hypothyroidism, unspecified: Secondary | ICD-10-CM | POA: Diagnosis not present

## 2022-04-05 DIAGNOSIS — M25612 Stiffness of left shoulder, not elsewhere classified: Secondary | ICD-10-CM

## 2022-04-05 DIAGNOSIS — Z6841 Body Mass Index (BMI) 40.0 and over, adult: Secondary | ICD-10-CM

## 2022-04-05 DIAGNOSIS — R5383 Other fatigue: Secondary | ICD-10-CM

## 2022-04-05 DIAGNOSIS — H9193 Unspecified hearing loss, bilateral: Secondary | ICD-10-CM

## 2022-04-05 NOTE — Progress Notes (Signed)
[facility] ?[address] ?[place], [place] [address] ? ?Internal MEDICINE  ?Office Visit Note ? ?Patient Name: [name] ? [id number]  ?[id number] ? ?Date of Service: 04/06/2022 ? ?Chief Complaint  ?Patient presents with  ? Follow-up  ? Gastroesophageal Reflux  ? Hypertension  ? Quality Metric Gaps  ?  Shingles Vaccine  ? ? ?HPI ?Pt is here for routine follow up and has a few concerns today ?-pt reports that 3 days after covid vaccine is when she started having heart concerns. Since then she has started seeing Dr. Jimmye Norman and he is following long term effects of covid and the vaccines. Since then he has changed he meds. Started on ivermectin a few months, tried tapering off, but heart fluttering started back so went back on ivermectin and is improving again. Also has her on nattokinase. ?-She reports she stopped amlodipine at start of march because she was tired and found bp was low. BP has continued to be in low 397Q systolic off this medication. ?-Cardiology has her on lasix as needed ?-thyroid medication was lowered back to 122mg after previously being raised to 209m when outside labs were done and found to be elevated. She would like to have updated labs today ?-some issues sleeping, has tried melatonin which works if used occasionally but not if regularly. Always uses cpap. Discussed active thinking techniques as she really does not want ot take medications and she thinks this will help. ?-needs hearing test and requests referral ?-left shoulder pain and reduced ROM, limiting her exercise now. Reports this has been a problem for a long time and may be getting worse now. Will send to ortho ?-Also notes she has discoloration to her nails and her rheumatologist gave her a topical medication to use ?-has been losing weight, has lost 51lbs since last visit ?-certain foods will trigger SIBO still. Finding now broccoli and cauliflower causes problems, starts on left side and moves to right. Has appt  with new GI doctor and will be seeing Dr. WoAllen Norrisn June. She has a hx of ulcerative colitis s/p total colectomy age 56? ?Current Medication: ?Outpatient Encounter Medications as of 04/05/2022  ?Medication Sig  ? Cranberry 1000 MG CAPS Take 1 tablet by mouth daily.  ? CVS ASPIRIN LOW DOSE 81 MG EC tablet TAKE 2 TABLETS BY MOUTH DAILY  ? famotidine (PEPCID) 20 MG tablet TAKE ONE TABLET BY MOUTH TWICE A DAY  ? folic acid (FOLVITE) 1 MG tablet Take 1 tablet (1 mg total) by mouth daily.  ? ivermectin (STROMECTOL) 3 MG TABS tablet Take 12 mg by mouth once.  ? levonorgestrel (MIRENA) 20 MCG/24HR IUD by Intrauterine route.  ? levothyroxine (SYNTHROID) 200 MCG tablet Take 1 tablet (200 mcg total) by mouth daily. (Patient taking differently: Take 150 mcg by mouth daily.)  ? Misc. Devices (PULSE OXIMETER) MISC Use as directed on finger to check oxygen level daily  DX: I73.1  ? Nattokinase 100 MG CAPS Take 100 mg by mouth. Once daily  ? Omega-3 Fatty Acids (FISH OIL) 1360 MG CAPS Take 1,400 mg by mouth.  ? Pediatric Multiple Vitamins (MULTIVITAMIN CHILDRENS PO) Take by mouth. Flinstones complete generic version  ? potassium chloride (KLOR-CON) 10 MEQ tablet Take 1 tablet (10 mEq total) by mouth daily. (Patient taking differently: Take 10 mEq by mouth daily. Takes every other day)  ? Probiotic Product (PROBIOTIC ADVANCED PO) Take 1 capsule by mouth daily.  ? Vitamin D, Ergocalciferol, (DRISDOL) 1.25 MG (50000 UNIT)  CAPS capsule TAKE 1 CAPSULE BY MOUTH ONCE WEEKLY  ? [DISCONTINUED] amLODipine (NORVASC) 10 MG tablet TAKE 1 TABLET BY MOUTH EVERYDAY AT BEDTIME  ? [DISCONTINUED] pantoprazole (PROTONIX) 40 MG tablet TAKE 1 TABLET BY MOUTH TWICE A DAY  ? ?No facility-administered encounter medications on file as of 04/05/2022.  ? ? ?Surgical History: ?Past Surgical History:  ?Procedure Laterality Date  ? ADENOIDECTOMY    ? Small child  ? APPENDECTOMY    ? Age 10  ? COLON SURGERY  1980  ? ileostomy and internal pouch age 23  ?  CORONARY/GRAFT ACUTE MI REVASCULARIZATION N/A 06/12/2021  ? Procedure: Coronary/Graft Acute MI Revascularization;  Surgeon: Isaias Cowman, MD;  Location: [facility];  Service: Cardiovascular;  Laterality: N/A;  ? DILATATION & CURETTAGE/HYSTEROSCOPY WITH MYOSURE N/A 05/02/2015  ? Procedure: [place];  Surgeon: Malachy Mood, MD;  Location: [facility];  Service: Gynecology;  Laterality: N/A;  ? LAPAROSCOPIC GASTRIC SLEEVE RESECTION  07/06/2016  ? LEFT HEART CATH AND CORONARY ANGIOGRAPHY N/A 06/12/2021  ? Procedure: LEFT HEART CATH AND CORONARY ANGIOGRAPHY;  Surgeon: Isaias Cowman, MD;  Location: [facility];  Service: Cardiovascular;  Laterality: N/A;  ? ? ?Medical History: ?Past Medical History:  ?Diagnosis Date  ? Berger's disease 2011  ? 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 Family history of adverse reaction to anesthesia   ? sister and daughter difficult to wake up  ? Folic acid deficiency   ? GERD (gastroesophageal reflux disease)   ? Hypertension   ? Hypothyroidism   ? IDA (iron deficiency anemia)   ? Osteoarthritis   ? PVC's (premature ventricular contractions)   ? Thrombocytosis   ? ? ?Family History: ?Family History  ?Problem Relation Age of Onset  ? Clotting disorder Mother   ? Hypertension Mother   ? Congestive Heart Failure Father   ? Hypertension Father   ? Stroke Sister   ? Rheum arthritis Sister   ? Congestive Heart Failure Maternal Grandmother   ? Colon cancer Maternal Grandfather 51  ? Skin cancer Maternal Aunt   ?     Basal cell  ? Skin cancer Maternal Uncle   ?     Basal cell  ? Skin cancer Cousin   ?     Basal Cell  ? ? ?Social History  ? ?Socioeconomic History  ? Marital status: Married  ?  Spouse name: Not on file  ? Number of children: Not on file  ? Years of education: Not on file  ? Highest education level: Not on file  ?Occupational History  ? Not on file  ?Tobacco Use  ? Smoking status: Former  ?  Types: Cigarettes  ?  Quit date:  04/28/2010  ?  Years since quitting: 11.9  ? Smokeless tobacco: Never  ?Substance and Sexual Activity  ? Alcohol use: No  ?  Alcohol/week: 0.0 standard drinks  ? Drug use: No  ? Sexual activity: Yes  ?  Birth control/protection: None, I.U.D.  ?Other Topics Concern  ? Not on file  ?Social History Narrative  ? Not on file  ? ?Social Determinants of Health  ? ?Financial Resource Strain: Not on file  ?Food Insecurity: Not on file  ?Transportation Needs: Not on file  ?Physical Activity: Not on file  ?Stress: Not on file  ?Social Connections: Not on file  ?Intimate Partner Violence: Not on file  ? ? ? ? ?Review of Systems  ?Constitutional:  Negative for chills, fatigue and unexpected  weight change.  ?HENT:  Negative for congestion, postnasal drip, rhinorrhea, sneezing and sore throat.   ?Eyes:  Negative for redness.  ?Respiratory:  Negative for cough, chest tightness and shortness of breath.   ?Cardiovascular:  Negative for chest pain and palpitations.  ?Gastrointestinal:  Negative for abdominal pain, constipation, diarrhea, nausea and vomiting.  ?Genitourinary:  Negative for dysuria and frequency.  ?Musculoskeletal:  Positive for arthralgias. Negative for back pain, joint swelling and neck pain.  ?Skin:  Negative for rash.  ?Neurological: Negative.  Negative for tremors and numbness.  ?Hematological:  Negative for adenopathy. Does not bruise/bleed easily.  ?Psychiatric/Behavioral:  Positive for sleep disturbance. Negative for behavioral problems (Depression) and suicidal ideas. The patient is nervous/anxious.   ? ?Vital Signs: ?BP 126/78   Pulse 78   Temp 97.6 ?F (36.4 ?C)   Resp 16   Ht 5' 7.5" (1.715 m)   Wt (!) 301 lb (136.5 kg)   SpO2 98%   BMI 46.45 kg/m?  ? ? ?Physical Exam ?Vitals and nursing note reviewed.  ?Constitutional:   ?   Appearance: She is obese.  ?HENT:  ?   Head: Normocephalic and atraumatic.  ?   Right Ear: Tympanic membrane normal.  ?   Left Ear: Tympanic membrane normal.  ?Eyes:  ?    Extraocular Movements: Extraocular movements intact.  ?   Pupils: Pupils are equal, round, and reactive to light.  ?Cardiovascular:  ?   Rate and Rhythm: Normal rate and regular rhythm.  ?Pulmonary:  ?   Effort: Pulmonary effort

## 2022-04-06 ENCOUNTER — Telehealth: Payer: Self-pay

## 2022-04-06 ENCOUNTER — Other Ambulatory Visit
Admission: RE | Admit: 2022-04-06 | Discharge: 2022-04-06 | Disposition: A | Discharge status: Home / Self Care | Payer: Commercial Managed Care - HMO | Attending: Physician Assistant | Admitting: Physician Assistant

## 2022-04-06 DIAGNOSIS — K6389 Other specified diseases of intestine: Secondary | ICD-10-CM | POA: Diagnosis not present

## 2022-04-06 DIAGNOSIS — E782 Mixed hyperlipidemia: Secondary | ICD-10-CM | POA: Insufficient documentation

## 2022-04-06 DIAGNOSIS — E039 Hypothyroidism, unspecified: Secondary | ICD-10-CM | POA: Diagnosis not present

## 2022-04-06 DIAGNOSIS — R5383 Other fatigue: Secondary | ICD-10-CM | POA: Diagnosis present

## 2022-04-06 LAB — LIPID PANEL
Cholesterol: 126 mg/dL (ref 0–200)
HDL: 36 mg/dL — ABNORMAL LOW (ref >40)
LDL Cholesterol: 79 mg/dL (ref 0–99)
Total CHOL/HDL Ratio: 3.5 RATIO
Triglycerides: 55 mg/dL (ref <150)
VLDL: 11 mg/dL (ref 0–40)

## 2022-04-06 LAB — CBC WITH DIFFERENTIAL/PLATELET
Abs Immature Granulocytes: 0.02 10*3/uL (ref 0.00–0.07)
Basophils Absolute: 0 10*3/uL (ref 0.0–0.1)
Basophils Relative: 0 %
Eosinophils Absolute: 0.1 10*3/uL (ref 0.0–0.5)
Eosinophils Relative: 2 %
HCT: 46.3 % — ABNORMAL HIGH (ref 36.0–46.0)
Hemoglobin: 15.1 g/dL — ABNORMAL HIGH (ref 12.0–15.0)
Immature Granulocytes: 0 %
Lymphocytes Relative: 38 %
Lymphs Abs: 2.8 10*3/uL (ref 0.7–4.0)
MCH: 29.8 pg (ref 26.0–34.0)
MCHC: 32.6 g/dL (ref 30.0–36.0)
MCV: 91.3 fL (ref 80.0–100.0)
Monocytes Absolute: 0.7 10*3/uL (ref 0.1–1.0)
Monocytes Relative: 10 %
Neutro Abs: 3.6 10*3/uL (ref 1.7–7.7)
Neutrophils Relative %: 50 %
Platelets: 219 10*3/uL (ref 150–400)
RBC: 5.07 MIL/uL (ref 3.87–5.11)
RDW: 14.8 % (ref 11.5–15.5)
WBC: 7.3 10*3/uL (ref 4.0–10.5)
nRBC: 0 % (ref 0.0–0.2)

## 2022-04-06 LAB — COMPREHENSIVE METABOLIC PANEL
ALT: 24 U/L (ref 0–44)
AST: 23 U/L (ref 15–41)
Albumin: 3.6 g/dL (ref 3.5–5.0)
Alkaline Phosphatase: 85 U/L (ref 38–126)
Anion gap: 11 (ref 5–15)
BUN: 13 mg/dL (ref 6–20)
CO2: 23 mmol/L (ref 22–32)
Calcium: 9.1 mg/dL (ref 8.9–10.3)
Chloride: 101 mmol/L (ref 98–111)
Creatinine, Ser: 0.58 mg/dL (ref 0.44–1.00)
GFR, Estimated: >60 mL/min (ref >60)
Glucose, Bld: 97 mg/dL (ref 70–99)
Potassium: 3.4 mmol/L — ABNORMAL LOW (ref 3.5–5.1)
Sodium: 135 mmol/L (ref 135–145)
Total Bilirubin: 1 mg/dL (ref 0.3–1.2)
Total Protein: 7.6 g/dL (ref 6.5–8.1)

## 2022-04-06 LAB — IRON AND TIBC
Iron: 62 ug/dL (ref 28–170)
Saturation Ratios: 18 % (ref 10.4–31.8)
TIBC: 342 ug/dL (ref 250–450)
UIBC: 280 ug/dL

## 2022-04-06 LAB — T4, FREE: Free T4: 1.94 ng/dL — ABNORMAL HIGH (ref 0.61–1.12)

## 2022-04-06 LAB — MAGNESIUM: Magnesium: 1.8 mg/dL (ref 1.7–2.4)

## 2022-04-06 LAB — CORTISOL: Cortisol, Plasma: 9.7 ug/dL

## 2022-04-06 LAB — TSH: TSH: 0.057 u[IU]/mL — ABNORMAL LOW (ref 0.350–4.500)

## 2022-04-06 LAB — AMYLASE: Amylase: 745 U/L — ABNORMAL HIGH (ref 28–100)

## 2022-04-06 NOTE — Telephone Encounter (Signed)
Awaiting 04/05/22 office notes for ortho referral-[name] ?

## 2022-04-06 NOTE — Telephone Encounter (Signed)
Awaiting 04/05/22 office notes for audiology referral-[name] ?

## 2022-04-07 ENCOUNTER — Encounter: Payer: Self-pay | Admitting: Physician Assistant

## 2022-04-08 NOTE — Telephone Encounter (Signed)
Referral sent via [facility] to [facility]-[name] ?

## 2022-04-08 NOTE — Telephone Encounter (Signed)
Ortho referral sent via [facility] to [facility] ?

## 2022-04-12 ENCOUNTER — Other Ambulatory Visit: Payer: Self-pay | Admitting: Physician Assistant

## 2022-04-12 DIAGNOSIS — E876 Hypokalemia: Secondary | ICD-10-CM

## 2022-04-12 MED ORDER — POTASSIUM CHLORIDE CRYS ER 10 MEQ PO TBCR: 10.0000 meq | EXTENDED_RELEASE_TABLET | Freq: Every day | ORAL | 2 refills | Status: DC

## 2022-04-13 NOTE — Telephone Encounter (Signed)
Per [name] w/ [facility], since patient has balance and declined to set up payments, referral was declined-[name] ?

## 2022-04-21 ENCOUNTER — Encounter: Payer: Self-pay | Admitting: Physician Assistant

## 2022-04-22 NOTE — Telephone Encounter (Signed)
Spoke with pt that she can keep clean and if she not feeling better we can send podiatrist  ?

## 2022-04-26 ENCOUNTER — Encounter: Payer: Self-pay | Admitting: Physician Assistant

## 2022-04-26 ENCOUNTER — Other Ambulatory Visit: Payer: Self-pay | Admitting: Physician Assistant

## 2022-04-26 DIAGNOSIS — M79676 Pain in unspecified toe(s): Secondary | ICD-10-CM

## 2022-04-27 ENCOUNTER — Telehealth: Payer: Self-pay

## 2022-04-27 NOTE — Telephone Encounter (Signed)
Faxed  wheelchair to [facility]  ?

## 2022-05-04 ENCOUNTER — Ambulatory Visit: Payer: Commercial Managed Care - HMO | Admitting: Podiatry

## 2022-05-06 ENCOUNTER — Encounter: Payer: Self-pay | Admitting: Nurse Practitioner

## 2022-05-13 ENCOUNTER — Ambulatory Visit: Payer: Commercial Managed Care - HMO | Admitting: Podiatry

## 2022-05-13 ENCOUNTER — Encounter: Payer: Self-pay | Admitting: Podiatry

## 2022-05-13 DIAGNOSIS — L603 Nail dystrophy: Secondary | ICD-10-CM | POA: Diagnosis not present

## 2022-05-13 DIAGNOSIS — Z79899 Other long term (current) drug therapy: Secondary | ICD-10-CM

## 2022-05-13 DIAGNOSIS — B351 Tinea unguium: Secondary | ICD-10-CM

## 2022-05-13 MED ORDER — TERBINAFINE HCL 250 MG PO TABS: 250.0000 mg | ORAL_TABLET | Freq: Every day | ORAL | 2 refills | Status: AC

## 2022-05-13 NOTE — Progress Notes (Signed)
Subjective:  Patient ID: [name], female    DOB: 1966-01-27,  MRN: [id number]  Chief Complaint  Patient presents with   Nail Problem    Bilateral hallux nail  Left hallux nail discolored and right hallux nail bleeds at the nail bed    56 y.o. female presents with the above complaint.  Patient presents with complaint of painful right hallux with loosening of the nail.  Patient is painful to touch is progressive gotten worse there is some bleeding associated with it.  She is tried over-the-counter Penlac which has not helped.  She also has a complaint of thickened elongated dystrophic toenails x2 bilateral hallux.  Pain on palpation.  She wanted to get it evaluated and discuss Lamisil therapy to help clear out the fungal infection.  She denies any other acute issues pain scale is 5 out of 10 is aching nature to the right hallux.   Review of Systems: Negative except as noted in the HPI. Denies N/V/F/Ch.  Past Medical History:  Diagnosis Date   Berger's disease 2011   Buerger's disease ([place])    Family history of adverse reaction to anesthesia    sister and daughter difficult to wake up   Folic acid deficiency    GERD (gastroesophageal reflux disease)    Hypertension    Hypothyroidism    IDA (iron deficiency anemia)    Osteoarthritis    PVC's (premature ventricular contractions)    Thrombocytosis     Current Outpatient Medications:    terbinafine (LAMISIL) 250 MG tablet, Take 1 tablet (250 mg total) by mouth daily., Disp: 30 tablet, Rfl: 2   Cranberry 1000 MG CAPS, Take 1 tablet by mouth daily., Disp: , Rfl:    CVS ASPIRIN LOW DOSE 81 MG EC tablet, TAKE 2 TABLETS BY MOUTH DAILY, Disp: 180 tablet, Rfl: 1   famotidine (PEPCID) 20 MG tablet, TAKE ONE TABLET BY MOUTH TWICE A DAY, Disp: 180 tablet, Rfl: 2   folic acid (FOLVITE) 1 MG tablet, Take 1 tablet (1 mg total) by mouth daily., Disp: 90 tablet, Rfl: 3   ivermectin (STROMECTOL) 3 MG TABS tablet, Take 12 mg by mouth once., Disp:  , Rfl:    levonorgestrel (MIRENA) 20 MCG/24HR IUD, by Intrauterine route., Disp: , Rfl:    levothyroxine (SYNTHROID) 200 MCG tablet, Take 1 tablet (200 mcg total) by mouth daily. (Patient taking differently: Take 150 mcg by mouth daily.), Disp: 90 tablet, Rfl: 3   Misc. Devices (PULSE OXIMETER) MISC, Use as directed on finger to check oxygen level daily  DX: I73.1, Disp: 1 each, Rfl: 3   Nattokinase 100 MG CAPS, Take 100 mg by mouth. Once daily, Disp: , Rfl:    Omega-3 Fatty Acids (FISH OIL) 1360 MG CAPS, Take 1,400 mg by mouth., Disp: , Rfl:    Pediatric Multiple Vitamins (MULTIVITAMIN CHILDRENS PO), Take by mouth. Flinstones complete generic version, Disp: , Rfl:    potassium chloride (KLOR-CON M) 10 MEQ tablet, Take 1 tablet (10 mEq total) by mouth daily. Except for 1 day per week take 2 tablets., Disp: 35 tablet, Rfl: 2   Probiotic Product (PROBIOTIC ADVANCED PO), Take 1 capsule by mouth daily., Disp: , Rfl:    Vitamin D, Ergocalciferol, (DRISDOL) 1.25 MG (50000 UNIT) CAPS capsule, TAKE 1 CAPSULE BY MOUTH ONCE WEEKLY, Disp: 12 capsule, Rfl: 2  Social History   Tobacco Use  Smoking Status Former   Types: Cigarettes   Quit date: 04/28/2010   Years since quitting: 12.0  Smokeless Tobacco Never    Allergies  Allergen Reactions   Yeast-Related Products    Tape Rash    Some tapes cause a rash, possible the paper tape.   Objective:  There were no vitals filed for this visit. There is no height or weight on file to calculate BMI. Constitutional Well developed. Well nourished.  Vascular Dorsalis pedis pulses palpable bilaterally. Posterior tibial pulses palpable bilaterally. Capillary refill normal to all digits.  No cyanosis or clubbing noted. Pedal hair growth normal.  Neurologic Normal speech. Oriented to person, place, and time. Epicritic sensation to light touch grossly present bilaterally.  Dermatologic Pain on palpation of the entire/total nail on 1st digit of the right.   Thickened elongated dystrophic toenail to bilateral hallux noted consistent with fungus No other open wounds. No skin lesions.  Orthopedic: Normal joint ROM without pain or crepitus bilaterally. No visible deformities. No bony tenderness.   Radiographs: None Assessment:   1. Onychomycosis due to dermatophyte   2. Long-term use of high-risk medication   3. Nail dystrophy    Plan:  Patient was evaluated and treated and all questions answered.  Nail contusion/dystrophy hallux, right -Patient elects to proceed with minor surgery to remove entire toenail today. Consent reviewed and signed by patient. -Entire/total nail excised. See procedure note. -Educated on post-procedure care including soaking. Written instructions provided and reviewed. -Patient to follow up in 2 weeks for nail check.  Onychomycosis bilateral hallux -Educated the patient on the etiology of onychomycosis and various treatment options associated with improving the fungal load.  I explained to the patient that there is 3 treatment options available to treat the onychomycosis including topical, p.o., laser treatment.  Patient elected to undergo p.o. options with Lamisil/terbinafine therapy.  In order for me to start the medication therapy, I explained to the patient the importance of evaluating the liver and obtaining the liver function test.  Once the liver function test comes back normal I will start him on 84-monthcourse of Lamisil therapy.  Patient understood all risk and would like to proceed with Lamisil therapy.  I have asked the patient to immediately stop the Lamisil therapy if she has any reactions to it and call the office or go to the emergency room right away.  Patient states understanding   Procedure: Excision of entire/total nail  Location: Right 1st toe digit Anesthesia: Lidocaine 1% plain; 1.5 mL and Marcaine 0.5% plain; 1.5 mL, digital block. Skin Prep: Betadine. Dressing: Silvadene; telfa; dry, sterile,  compression dressing. Technique: Following skin prep, the toe was exsanguinated and a tourniquet was secured at the base of the toe. The affected nail border was freed and excised. The tourniquet was then removed and sterile dressing applied. Disposition: Patient tolerated procedure well. Patient to return in 2 weeks for follow-up.   No follow-ups on file.

## 2022-05-13 NOTE — Patient Instructions (Signed)

## 2022-05-26 ENCOUNTER — Encounter: Payer: Self-pay | Admitting: Internal Medicine

## 2022-06-02 ENCOUNTER — Ambulatory Visit: Payer: Managed Care, Other (non HMO) | Admitting: Gastroenterology

## 2022-06-10 ENCOUNTER — Other Ambulatory Visit
Admission: RE | Admit: 2022-06-10 | Discharge: 2022-06-10 | Disposition: A | Discharge status: Home / Self Care | Payer: Commercial Managed Care - HMO | Attending: Emergency Medicine | Admitting: Emergency Medicine

## 2022-06-10 DIAGNOSIS — R0600 Dyspnea, unspecified: Secondary | ICD-10-CM | POA: Diagnosis not present

## 2022-06-10 DIAGNOSIS — R001 Bradycardia, unspecified: Secondary | ICD-10-CM | POA: Insufficient documentation

## 2022-06-10 DIAGNOSIS — E039 Hypothyroidism, unspecified: Secondary | ICD-10-CM | POA: Insufficient documentation

## 2022-06-10 LAB — TROPONIN I (HIGH SENSITIVITY): Troponin I (High Sensitivity): 4 ng/L (ref <18)

## 2022-06-10 LAB — LIPID PANEL
Cholesterol: 126 mg/dL (ref 0–200)
HDL: 46 mg/dL (ref >40)
LDL Cholesterol: 73 mg/dL (ref 0–99)
Total CHOL/HDL Ratio: 2.7 RATIO
Triglycerides: 35 mg/dL (ref <150)
VLDL: 7 mg/dL (ref 0–40)

## 2022-06-10 LAB — D-DIMER, QUANTITATIVE: D-Dimer, Quant: 0.36 ug/mL-FEU (ref 0.00–0.50)

## 2022-06-10 LAB — CBC
HCT: 49.1 % — ABNORMAL HIGH (ref 36.0–46.0)
Hemoglobin: 15.9 g/dL — ABNORMAL HIGH (ref 12.0–15.0)
MCH: 30.5 pg (ref 26.0–34.0)
MCHC: 32.4 g/dL (ref 30.0–36.0)
MCV: 94.1 fL (ref 80.0–100.0)
Platelets: 237 10*3/uL (ref 150–400)
RBC: 5.22 MIL/uL — ABNORMAL HIGH (ref 3.87–5.11)
RDW: 15.4 % (ref 11.5–15.5)
WBC: 8 10*3/uL (ref 4.0–10.5)
nRBC: 0 % (ref 0.0–0.2)

## 2022-06-10 LAB — COMPREHENSIVE METABOLIC PANEL
ALT: 21 U/L (ref 0–44)
AST: 22 U/L (ref 15–41)
Albumin: 3.9 g/dL (ref 3.5–5.0)
Alkaline Phosphatase: 83 U/L (ref 38–126)
Anion gap: 11 (ref 5–15)
BUN: 12 mg/dL (ref 6–20)
CO2: 19 mmol/L — ABNORMAL LOW (ref 22–32)
Calcium: 9.4 mg/dL (ref 8.9–10.3)
Chloride: 109 mmol/L (ref 98–111)
Creatinine, Ser: 0.65 mg/dL (ref 0.44–1.00)
GFR, Estimated: >60 mL/min (ref >60)
Glucose, Bld: 92 mg/dL (ref 70–99)
Potassium: 3.9 mmol/L (ref 3.5–5.1)
Sodium: 139 mmol/L (ref 135–145)
Total Bilirubin: 0.9 mg/dL (ref 0.3–1.2)
Total Protein: 8 g/dL (ref 6.5–8.1)

## 2022-06-10 LAB — PROTIME-INR
INR: 1.1 (ref 0.8–1.2)
Prothrombin Time: 14 seconds (ref 11.4–15.2)

## 2022-06-10 LAB — TSH: TSH: 2.284 u[IU]/mL (ref 0.350–4.500)

## 2022-06-10 LAB — APTT: aPTT: 25 seconds (ref 24–36)

## 2022-06-10 LAB — T4, FREE: Free T4: 1.56 ng/dL — ABNORMAL HIGH (ref 0.61–1.12)

## 2022-06-17 ENCOUNTER — Encounter: Payer: Self-pay | Admitting: Internal Medicine

## 2022-06-28 ENCOUNTER — Encounter: Payer: Self-pay | Admitting: Physician Assistant

## 2022-06-28 ENCOUNTER — Telehealth: Payer: Self-pay

## 2022-06-28 NOTE — Telephone Encounter (Signed)
Faxed [facility] for raised toilet seat

## 2022-06-30 ENCOUNTER — Encounter: Payer: Self-pay | Admitting: Physician Assistant

## 2022-07-22 ENCOUNTER — Encounter: Payer: Self-pay | Admitting: Internal Medicine

## 2022-07-25 ENCOUNTER — Other Ambulatory Visit: Payer: Self-pay | Admitting: Internal Medicine

## 2022-07-25 DIAGNOSIS — E538 Deficiency of other specified B group vitamins: Secondary | ICD-10-CM

## 2022-08-20 ENCOUNTER — Telehealth: Payer: Self-pay | Admitting: Internal Medicine

## 2022-08-20 NOTE — Telephone Encounter (Signed)
Lvm to move 09/27/22 appointment-[name]

## 2022-09-07 ENCOUNTER — Encounter: Payer: Managed Care, Other (non HMO) | Admitting: Internal Medicine

## 2022-09-16 ENCOUNTER — Ambulatory Visit: Payer: Commercial Managed Care - HMO | Admitting: Podiatry

## 2022-09-16 DIAGNOSIS — B351 Tinea unguium: Secondary | ICD-10-CM

## 2022-09-16 DIAGNOSIS — L603 Nail dystrophy: Secondary | ICD-10-CM

## 2022-09-16 DIAGNOSIS — Z79899 Other long term (current) drug therapy: Secondary | ICD-10-CM

## 2022-09-16 NOTE — Progress Notes (Signed)
Subjective:  Patient ID: [name], female    DOB: 01-Mar-1966,  MRN: [id number]  Chief Complaint  Patient presents with   Nail Problem    56 y.o. female presents with the above complaint.  Presents for follow-up of bilateral hallux onychomycosis.  She states a fungus is improving some.  He was cleared before.  She would like to discuss another round.  She denies any other acute complaints.  She is doing her primary care physician we will do another blood work.   Review of Systems: Negative except as noted in the HPI. Denies N/V/F/Ch.  Past Medical History:  Diagnosis Date   Berger's disease 2011   Buerger's disease ([place])    Family history of adverse reaction to anesthesia    sister and daughter difficult to wake up   Folic acid deficiency    GERD (gastroesophageal reflux disease)    Hypertension    Hypothyroidism    IDA (iron deficiency anemia)    Osteoarthritis    PVC's (premature ventricular contractions)    Thrombocytosis     Current Outpatient Medications:    Cranberry 1000 MG CAPS, Take 1 tablet by mouth daily., Disp: , Rfl:    CVS ASPIRIN LOW DOSE 81 MG EC tablet, TAKE 2 TABLETS BY MOUTH DAILY, Disp: 180 tablet, Rfl: 1   famotidine (PEPCID) 20 MG tablet, TAKE ONE TABLET BY MOUTH TWICE A DAY, Disp: 180 tablet, Rfl: 2   folic acid (FOLVITE) 1 MG tablet, TAKE 1 TABLET BY MOUTH EVERY DAY, Disp: 90 tablet, Rfl: 3   ivermectin (STROMECTOL) 3 MG TABS tablet, Take 12 mg by mouth once., Disp: , Rfl:    levonorgestrel (MIRENA) 20 MCG/24HR IUD, by Intrauterine route., Disp: , Rfl:    levothyroxine (SYNTHROID) 200 MCG tablet, Take 1 tablet (200 mcg total) by mouth daily. (Patient taking differently: Take 150 mcg by mouth daily.), Disp: 90 tablet, Rfl: 3   Misc. Devices (PULSE OXIMETER) MISC, Use as directed on finger to check oxygen level daily  DX: I73.1, Disp: 1 each, Rfl: 3   Nattokinase 100 MG CAPS, Take 100 mg by mouth. Once daily, Disp: , Rfl:    Omega-3 Fatty Acids (FISH  OIL) 1360 MG CAPS, Take 1,400 mg by mouth., Disp: , Rfl:    Pediatric Multiple Vitamins (MULTIVITAMIN CHILDRENS PO), Take by mouth. Flinstones complete generic version, Disp: , Rfl:    potassium chloride (KLOR-CON M) 10 MEQ tablet, Take 1 tablet (10 mEq total) by mouth daily. Except for 1 day per week take 2 tablets., Disp: 35 tablet, Rfl: 2   Probiotic Product (PROBIOTIC ADVANCED PO), Take 1 capsule by mouth daily., Disp: , Rfl:    Vitamin D, Ergocalciferol, (DRISDOL) 1.25 MG (50000 UNIT) CAPS capsule, TAKE 1 CAPSULE BY MOUTH ONCE WEEKLY, Disp: 12 capsule, Rfl: 2  Social History   Tobacco Use  Smoking Status Former   Types: Cigarettes   Quit date: 04/28/2010   Years since quitting: 12.3  Smokeless Tobacco Never    Allergies  Allergen Reactions   Yeast-Related Products    Tape Rash    Some tapes cause a rash, possible the paper tape.   Objective:  There were no vitals filed for this visit. There is no height or weight on file to calculate BMI. Constitutional Well developed. Well nourished.  Vascular Dorsalis pedis pulses palpable bilaterally. Posterior tibial pulses palpable bilaterally. Capillary refill normal to all digits.  No cyanosis or clubbing noted. Pedal hair growth normal.  Neurologic Normal speech.  Oriented to person, place, and time. Epicritic sensation to light touch grossly present bilaterally.  Dermatologic Pain on palpation of the entire/total nail on 1st digit of the right.  Thickened elongated dystrophic toenail to bilateral hallux noted consistent with fungus No other open wounds. No skin lesions.  Orthopedic: Normal joint ROM without pain or crepitus bilaterally. No visible deformities. No bony tenderness.   Radiographs: None Assessment:   1. Onychomycosis due to dermatophyte   2. Long-term use of high-risk medication   3. Nail dystrophy     Plan:  Patient was evaluated and treated and all questions answered.  Nail contusion/dystrophy hallux,  right -Currently healed doing well.  Onychomycosis bilateral hallux~improving -Educated the patient on the etiology of onychomycosis and various treatment options associated with improving the fungal load.  I explained to the patient that there is 3 treatment options available to treat the onychomycosis including topical, p.o., laser treatment.  Patient elected to undergo p.o. options with second round Lamisil/terbinafine therapy.  In order for me to do an other round the medication therapy, I explained to the patient the importance of evaluating the liver and obtaining the liver function test.  Once the liver function test comes back normal I will start him on second round 15-monthcourse of Lamisil therapy.  Patient understood all risk and would like to proceed with Lamisil therapy.  I have asked the patient to immediately stop the Lamisil therapy if she has any reactions to it and call the office or go to the emergency room right away.  Patient states understanding    No follow-ups on file.

## 2022-09-18 ENCOUNTER — Other Ambulatory Visit: Payer: Self-pay | Admitting: Internal Medicine

## 2022-09-23 ENCOUNTER — Ambulatory Visit (INDEPENDENT_AMBULATORY_CARE_PROVIDER_SITE_OTHER): Payer: Self-pay | Admitting: Internal Medicine

## 2022-09-23 ENCOUNTER — Encounter: Payer: Self-pay | Admitting: Internal Medicine

## 2022-09-23 ENCOUNTER — Other Ambulatory Visit: Payer: Self-pay

## 2022-09-23 ENCOUNTER — Telehealth: Payer: Self-pay | Admitting: Internal Medicine

## 2022-09-23 DIAGNOSIS — G4733 Obstructive sleep apnea (adult) (pediatric): Secondary | ICD-10-CM

## 2022-09-23 DIAGNOSIS — Z9884 Bariatric surgery status: Secondary | ICD-10-CM

## 2022-09-23 DIAGNOSIS — R300 Dysuria: Secondary | ICD-10-CM

## 2022-09-23 DIAGNOSIS — Z0001 Encounter for general adult medical examination with abnormal findings: Secondary | ICD-10-CM

## 2022-09-23 DIAGNOSIS — M25561 Pain in right knee: Secondary | ICD-10-CM

## 2022-09-23 DIAGNOSIS — E039 Hypothyroidism, unspecified: Secondary | ICD-10-CM

## 2022-09-23 DIAGNOSIS — G8929 Other chronic pain: Secondary | ICD-10-CM

## 2022-09-23 DIAGNOSIS — M25562 Pain in left knee: Secondary | ICD-10-CM

## 2022-09-23 DIAGNOSIS — R262 Difficulty in walking, not elsewhere classified: Secondary | ICD-10-CM

## 2022-09-23 DIAGNOSIS — R5381 Other malaise: Secondary | ICD-10-CM

## 2022-09-23 DIAGNOSIS — E662 Morbid (severe) obesity with alveolar hypoventilation: Secondary | ICD-10-CM

## 2022-09-23 DIAGNOSIS — Z6841 Body Mass Index (BMI) 40.0 and over, adult: Secondary | ICD-10-CM

## 2022-09-23 NOTE — Telephone Encounter (Signed)
Awaiting 09/23/22 office notes for Orthopedic referral-[name]

## 2022-09-23 NOTE — Progress Notes (Signed)
[facility] [place], [place] [address]  Internal MEDICINE  Office Visit Note  Patient Name: [name]  [id number]  [id number]  Date of Service: 09/29/2022  Chief Complaint  Patient presents with   Annual Exam   Gastroesophageal Reflux   Hypertension     HPI Pt is here for routine health maintenance examination She has done exteremly well with her wt loss, able to walk, she is close 100 lbs of wt loss Still has difficulty walking due to knee pain, has some ROM restriction as well due to prolonged sitting times H/O gastric bypass and has multiple nutritional deficiencies Recently Synthroid dose was decreased  She has follow ups with GI and cardiology   Pt is also on CPAP with O2, no recent downloads for compliance   Current Medication: Outpatient Encounter Medications as of 09/23/2022  Medication Sig   ASPIRIN LOW DOSE 81 MG tablet TAKE 2 TABLETS BY MOUTH DAILY   Cranberry 1000 MG CAPS Take 1 tablet by mouth daily.   famotidine (PEPCID) 20 MG tablet TAKE ONE TABLET BY MOUTH TWICE A DAY   folic acid (FOLVITE) 1 MG tablet TAKE 1 TABLET BY MOUTH EVERY DAY   ivermectin (STROMECTOL) 3 MG TABS tablet Take 12 mg by mouth once. Take 2 tablets twice daily.   levonorgestrel (MIRENA) 20 MCG/24HR IUD by Intrauterine route.   MAGNESIUM CHLORIDE PO Take 143 mg by mouth. Two tablets daily   Misc. Devices (PULSE OXIMETER) MISC Use as directed on finger to check oxygen level daily  DX: I73.1   Nattokinase 100 MG CAPS Take 100 mg by mouth. Once daily   Omega-3 Fatty Acids (FISH OIL) 1360 MG CAPS Take 1,400 mg by mouth.   potassium chloride (KLOR-CON M) 10 MEQ tablet Take 1 tablet (10 mEq total) by mouth daily. Except for 1 day per week take 2 tablets.   Probiotic Product (PROBIOTIC ADVANCED PO) Take 1 capsule by mouth daily.   [DISCONTINUED] levothyroxine (SYNTHROID) 200 MCG tablet Take 1 tablet (200 mcg total) by mouth daily. (Patient taking differently: Take  125 mcg by mouth daily.)   Pediatric Multiple Vitamins (MULTIVITAMIN CHILDRENS PO) Take by mouth. Flinstones complete generic version   Vitamin D, Ergocalciferol, (DRISDOL) 1.25 MG (50000 UNIT) CAPS capsule TAKE 1 CAPSULE BY MOUTH ONCE WEEKLY   No facility-administered encounter medications on file as of 09/23/2022.    Surgical History: Past Surgical History:  Procedure Laterality Date   ADENOIDECTOMY     Small child   APPENDECTOMY     Age 48   COLON SURGERY  1980   ileostomy and internal pouch age 31   CORONARY/GRAFT ACUTE MI REVASCULARIZATION N/A 06/12/2021   Procedure: Coronary/Graft Acute MI Revascularization;  Surgeon: Isaias Cowman, MD;  Location: [place] [facility];  Service: Cardiovascular;  Laterality: N/A;   DILATATION & CURETTAGE/HYSTEROSCOPY WITH MYOSURE N/A 05/02/2015   Procedure: DILATATION & CURETTAGE/HYSTEROSCOPY WITH MYOSURE;  Surgeon: Malachy Mood, MD;  Location: [facility];  Service: Gynecology;  Laterality: N/A;   LAPAROSCOPIC GASTRIC SLEEVE RESECTION  07/06/2016   LEFT HEART CATH AND CORONARY ANGIOGRAPHY N/A 06/12/2021   Procedure: LEFT HEART CATH AND CORONARY ANGIOGRAPHY;  Surgeon: Isaias Cowman, MD;  Location: [place] [facility];  Service: Cardiovascular;  Laterality: N/A;    Medical History: Past Medical History:  Diagnosis Date   Berger's disease 2011   Buerger's disease ([place])    Family history of adverse reaction to anesthesia    sister and daughter difficult to  wake up   Folic acid deficiency    GERD (gastroesophageal reflux disease)    Hypertension    Hypothyroidism    IDA (iron deficiency anemia)    Osteoarthritis    PVC's (premature ventricular contractions)    Thrombocytosis     Family History: Family History  Problem Relation Age of Onset   Clotting disorder Mother    Hypertension Mother    Congestive Heart Failure Father    Hypertension Father    Stroke Sister    Rheum arthritis Sister    Congestive Heart Failure  Maternal Grandmother    Colon cancer Maternal Grandfather 88   Skin cancer Maternal Aunt        Basal cell   Skin cancer Maternal Uncle        Basal cell   Skin cancer Cousin        Basal Cell    Social History: Social History   Socioeconomic History   Marital status: Married    Spouse name: Not on file   Number of children: Not on file   Years of education: Not on file   Highest education level: Not on file  Occupational History   Not on file  Tobacco Use   Smoking status: Former    Types: Cigarettes    Quit date: 04/28/2010    Years since quitting: 12.4   Smokeless tobacco: Never  Substance and Sexual Activity   Alcohol use: No    Alcohol/week: 0.0 standard drinks of alcohol   Drug use: No   Sexual activity: Yes    Birth control/protection: None, I.U.D.  Other Topics Concern   Not on file  Social History Narrative   Not on file   Social Determinants of Health   Financial Resource Strain: Not on file  Food Insecurity: Not on file  Transportation Needs: Not on file  Physical Activity: Not on file  Stress: Not on file  Social Connections: Not on file      Review of Systems  Constitutional:  Negative for chills, fatigue and unexpected weight change.  HENT:  Negative for congestion, postnasal drip, rhinorrhea, sneezing and sore throat.   Eyes:  Negative for redness.  Respiratory:  Negative for cough, chest tightness and shortness of breath.   Cardiovascular:  Negative for chest pain and palpitations.  Gastrointestinal:  Negative for abdominal pain, constipation, diarrhea, nausea and vomiting.  Genitourinary:  Negative for dysuria and frequency.  Musculoskeletal:  Positive for gait problem. Negative for arthralgias, back pain, joint swelling and neck pain.       Knee pain   Skin:  Negative for rash.  Neurological:  Negative for tremors and numbness.  Hematological:  Negative for adenopathy. Does not bruise/bleed easily.  Psychiatric/Behavioral:  Negative for  behavioral problems (Depression), sleep disturbance and suicidal ideas. The patient is not nervous/anxious.      Vital Signs: BP 129/75   Pulse 81   Temp 98.4 F (36.9 C)   Resp 16   Ht 5' 7.5" (1.715 m)   Wt 286 lb 9.6 oz (130 kg)   SpO2 99%   BMI 44.23 kg/m    Physical Exam Constitutional:      Appearance: Normal appearance.  HENT:     Head: Normocephalic and atraumatic.     Right Ear: Tympanic membrane normal.     Left Ear: Tympanic membrane normal.     Nose: Nose normal.     Mouth/Throat:     Mouth: Mucous membranes are moist.  Pharynx: No posterior oropharyngeal erythema.  Eyes:     Extraocular Movements: Extraocular movements intact.     Pupils: Pupils are equal, round, and reactive to light.  Cardiovascular:     Pulses: Normal pulses.     Heart sounds: Normal heart sounds.  Pulmonary:     Effort: Pulmonary effort is normal.     Breath sounds: Normal breath sounds.  Musculoskeletal:        General: Deformity present.     Comments: Decreased ROM ( knee)  abnormality of gait   Neurological:     General: No focal deficit present.     Mental Status: She is alert.  Psychiatric:        Mood and Affect: Mood normal.        Behavior: Behavior normal.          Assessment/Plan: 1. Encounter for general adult medical examination with abnormal findings All preventive HM is updated, might need BMD however we need to look into wt restrictions before scheduling this app   2. Hypothyroidism, unspecified type Recheck TSH and free T4, will adjust dosing accordingly   3. Ambulatory dysfunction Pt will get benefit from ortho input, RX for aqua therapy is given as well  Does have contracted ligaments posterior aspect of knee, might need PT/ surgical intervention   4. Physical deconditioning Will need PT /Aqua therapy  - Ambulatory referral to Orthopedic Surgery  5. Class 3 obesity with serious co-morbidities  Applauded her on her wt loss, continue with this  hard journey, emotional support given   6. OSA on CPAP Has not had a recent download, will look into getting one  - Pulse oximetry, overnight; Future  7. Chronic pain of both knees - Ambulatory referral to Orthopedic Surgery  8. Gastric bypass status for obesity Multiple nutritional deficiencies  - CBC with Differential/Platelet - Lipid Panel With LDL/HDL Ratio - TSH - T4, free - Comprehensive metabolic panel - Magnesium - Vitamin D (25 hydroxy) - Troponin I (High Sensitivity); Future - B12 and Folate Panel - Vitamin B6 - Fe+TIBC+Fer - Troponin I (High Sensitivity)   General Counseling: Sandy Salaam understanding of the findings of todays visit and agrees with plan of treatment. I have discussed any further diagnostic evaluation that may be needed or ordered today. We also reviewed her medications today. she has been encouraged to call the office with any questions or concerns that should arise related to todays visit.    Counseling:  [place] Controlled Substance Database was reviewed by me.  Orders Placed This Encounter  Procedures   Microscopic Examination   Urine Culture, Reflex   UA/M w/rflx Culture, Routine   CBC with Differential/Platelet   Lipid Panel With LDL/HDL Ratio   TSH   T4, free   Comprehensive metabolic panel   Magnesium   Vitamin D (25 hydroxy)   B12 and Folate Panel   Vitamin B6   Fe+TIBC+Fer   Amylase   Ambulatory referral to Orthopedic Surgery   Pulse oximetry, overnight    No orders of the defined types were placed in this encounter.   Total time spent:35 Minutes  Time spent includes review of chart, medications, test results, and follow up plan with the patient.     Lavera Guise, MD  Internal Medicine

## 2022-09-24 ENCOUNTER — Other Ambulatory Visit
Admission: RE | Admit: 2022-09-24 | Discharge: 2022-09-24 | Disposition: A | Discharge status: Home / Self Care | Payer: Commercial Managed Care - HMO | Attending: Internal Medicine | Admitting: Internal Medicine

## 2022-09-24 DIAGNOSIS — G8929 Other chronic pain: Secondary | ICD-10-CM | POA: Insufficient documentation

## 2022-09-24 DIAGNOSIS — Z6841 Body Mass Index (BMI) 40.0 and over, adult: Secondary | ICD-10-CM | POA: Diagnosis not present

## 2022-09-24 DIAGNOSIS — E662 Morbid (severe) obesity with alveolar hypoventilation: Secondary | ICD-10-CM | POA: Insufficient documentation

## 2022-09-24 DIAGNOSIS — Z9884 Bariatric surgery status: Secondary | ICD-10-CM | POA: Diagnosis not present

## 2022-09-24 DIAGNOSIS — M25561 Pain in right knee: Secondary | ICD-10-CM | POA: Diagnosis not present

## 2022-09-24 DIAGNOSIS — G4733 Obstructive sleep apnea (adult) (pediatric): Secondary | ICD-10-CM | POA: Diagnosis not present

## 2022-09-24 DIAGNOSIS — M25562 Pain in left knee: Secondary | ICD-10-CM | POA: Insufficient documentation

## 2022-09-24 DIAGNOSIS — Z0001 Encounter for general adult medical examination with abnormal findings: Secondary | ICD-10-CM | POA: Diagnosis present

## 2022-09-24 DIAGNOSIS — E039 Hypothyroidism, unspecified: Secondary | ICD-10-CM | POA: Insufficient documentation

## 2022-09-24 LAB — CBC WITH DIFFERENTIAL/PLATELET
Abs Immature Granulocytes: 0.03 10*3/uL (ref 0.00–0.07)
Basophils Absolute: 0 10*3/uL (ref 0.0–0.1)
Basophils Relative: 0 %
Eosinophils Absolute: 0.3 10*3/uL (ref 0.0–0.5)
Eosinophils Relative: 3 %
HCT: 46 % (ref 36.0–46.0)
Hemoglobin: 14.8 g/dL (ref 12.0–15.0)
Immature Granulocytes: 0 %
Lymphocytes Relative: 41 %
Lymphs Abs: 3.6 10*3/uL (ref 0.7–4.0)
MCH: 31.3 pg (ref 26.0–34.0)
MCHC: 32.2 g/dL (ref 30.0–36.0)
MCV: 97.3 fL (ref 80.0–100.0)
Monocytes Absolute: 0.6 10*3/uL (ref 0.1–1.0)
Monocytes Relative: 7 %
Neutro Abs: 4.3 10*3/uL (ref 1.7–7.7)
Neutrophils Relative %: 49 %
Platelets: 243 10*3/uL (ref 150–400)
RBC: 4.73 MIL/uL (ref 3.87–5.11)
RDW: 14.3 % (ref 11.5–15.5)
WBC: 8.8 10*3/uL (ref 4.0–10.5)
nRBC: 0 % (ref 0.0–0.2)

## 2022-09-24 LAB — COMPREHENSIVE METABOLIC PANEL
ALT: 22 U/L (ref 0–44)
AST: 22 U/L (ref 15–41)
Albumin: 4 g/dL (ref 3.5–5.0)
Alkaline Phosphatase: 75 U/L (ref 38–126)
Anion gap: 9 (ref 5–15)
BUN: 14 mg/dL (ref 6–20)
CO2: 23 mmol/L (ref 22–32)
Calcium: 9.3 mg/dL (ref 8.9–10.3)
Chloride: 108 mmol/L (ref 98–111)
Creatinine, Ser: 0.75 mg/dL (ref 0.44–1.00)
GFR, Estimated: >60 mL/min (ref >60)
Glucose, Bld: 99 mg/dL (ref 70–99)
Potassium: 4 mmol/L (ref 3.5–5.1)
Sodium: 140 mmol/L (ref 135–145)
Total Bilirubin: 0.9 mg/dL (ref 0.3–1.2)
Total Protein: 7.6 g/dL (ref 6.5–8.1)

## 2022-09-24 LAB — LIPID PANEL
Cholesterol: 161 mg/dL (ref 0–200)
HDL: 56 mg/dL (ref >40)
LDL Cholesterol: 96 mg/dL (ref 0–99)
Total CHOL/HDL Ratio: 2.9 RATIO
Triglycerides: 43 mg/dL (ref <150)
VLDL: 9 mg/dL (ref 0–40)

## 2022-09-24 LAB — MAGNESIUM: Magnesium: 2 mg/dL (ref 1.7–2.4)

## 2022-09-24 LAB — IRON AND TIBC
Iron: 64 ug/dL (ref 28–170)
Saturation Ratios: 17 % (ref 10.4–31.8)
TIBC: 384 ug/dL (ref 250–450)
UIBC: 320 ug/dL

## 2022-09-24 LAB — FERRITIN: Ferritin: 127 ng/mL (ref 11–307)

## 2022-09-24 LAB — FOLATE: Folate: 37 ng/mL (ref >5.9)

## 2022-09-24 LAB — TSH: TSH: 6.218 u[IU]/mL — ABNORMAL HIGH (ref 0.350–4.500)

## 2022-09-24 LAB — TROPONIN I (HIGH SENSITIVITY): Troponin I (High Sensitivity): 3 ng/L (ref <18)

## 2022-09-24 LAB — VITAMIN B12: Vitamin B-12: 237 pg/mL (ref 180–914)

## 2022-09-24 LAB — T4, FREE: Free T4: 1.35 ng/dL — ABNORMAL HIGH (ref 0.61–1.12)

## 2022-09-24 LAB — AMYLASE: Amylase: 991 U/L — ABNORMAL HIGH (ref 28–100)

## 2022-09-24 LAB — VITAMIN D 25 HYDROXY (VIT D DEFICIENCY, FRACTURES): Vit D, 25-Hydroxy: 57 ng/mL (ref 30–100)

## 2022-09-25 ENCOUNTER — Encounter: Payer: Self-pay | Admitting: Podiatry

## 2022-09-27 ENCOUNTER — Encounter: Payer: Self-pay | Admitting: Internal Medicine

## 2022-09-27 NOTE — Progress Notes (Signed)
Please let me know which dose of Synthroid she is on??

## 2022-09-27 NOTE — Progress Notes (Signed)
Left message for patient to call back  

## 2022-09-28 ENCOUNTER — Encounter: Payer: Self-pay | Admitting: Internal Medicine

## 2022-09-28 ENCOUNTER — Other Ambulatory Visit: Payer: Self-pay | Admitting: Internal Medicine

## 2022-09-28 ENCOUNTER — Other Ambulatory Visit: Payer: Self-pay | Admitting: Podiatry

## 2022-09-28 LAB — VITAMIN B6: Vitamin B6: 11.2 ug/L (ref 3.4–65.2)

## 2022-09-28 MED ORDER — LEVOTHYROXINE SODIUM 150 MCG PO TABS: 150.0000 ug | ORAL_TABLET | Freq: Every day | ORAL | 3 refills | Status: DC

## 2022-09-28 MED ORDER — TERBINAFINE HCL 250 MG PO TABS: 250.0000 mg | ORAL_TABLET | Freq: Every day | ORAL | 0 refills | Status: DC

## 2022-09-30 ENCOUNTER — Encounter: Payer: Self-pay | Admitting: Internal Medicine

## 2022-09-30 LAB — UA/M W/RFLX CULTURE, ROUTINE
Bilirubin, UA: NEGATIVE
Glucose, UA: NEGATIVE
Ketones, UA: NEGATIVE
Nitrite, UA: NEGATIVE
Protein,UA: NEGATIVE
RBC, UA: NEGATIVE
Specific Gravity, UA: 1.019 (ref 1.005–1.030)
Urobilinogen, Ur: 0.2 mg/dL (ref 0.2–1.0)
pH, UA: 6 (ref 5.0–7.5)

## 2022-09-30 LAB — URINE CULTURE, REFLEX

## 2022-09-30 LAB — MICROSCOPIC EXAMINATION
Bacteria, UA: NONE SEEN
Casts: NONE SEEN /lpf

## 2022-10-01 ENCOUNTER — Telehealth: Payer: Self-pay

## 2022-10-01 ENCOUNTER — Telehealth: Payer: Self-pay | Admitting: Internal Medicine

## 2022-10-01 ENCOUNTER — Encounter: Payer: Self-pay | Admitting: Internal Medicine

## 2022-10-01 ENCOUNTER — Other Ambulatory Visit: Payer: Self-pay

## 2022-10-01 MED ORDER — CIPROFLOXACIN HCL 500 MG PO TABS: ORAL_TABLET | ORAL | 0 refills | Status: DC

## 2022-10-01 NOTE — Telephone Encounter (Signed)
-----   Message from Lavera Guise, MD sent at 10/01/2022  9:36 AM EDT -----  Mervin Kung!! Please call in cipro 500 mg po bid x 5 days # 10, mild UTI, can be also contamination Check allergies

## 2022-10-01 NOTE — Telephone Encounter (Signed)
Orthopedic Surgery referral sent via [facility] to [facility]

## 2022-10-01 NOTE — Progress Notes (Signed)
[name]!! Please call in cipro 500 mg po bid x 5 days # 10, mild UTI, can be also contamination Check allergies

## 2022-10-01 NOTE — Telephone Encounter (Signed)
Pt.notified

## 2022-10-04 ENCOUNTER — Encounter: Payer: Self-pay | Admitting: Internal Medicine

## 2022-10-05 NOTE — Telephone Encounter (Signed)
Spoke with pt  will call back 2 weeks  to discuss  with dfk for further test  order

## 2022-10-11 ENCOUNTER — Encounter (INDEPENDENT_AMBULATORY_CARE_PROVIDER_SITE_OTHER): Payer: Self-pay

## 2022-10-19 ENCOUNTER — Encounter: Payer: Self-pay | Admitting: Internal Medicine

## 2022-10-20 ENCOUNTER — Encounter: Payer: Self-pay | Admitting: Podiatry

## 2022-10-21 NOTE — Telephone Encounter (Signed)
Please advise 

## 2022-10-29 ENCOUNTER — Other Ambulatory Visit
Admission: RE | Admit: 2022-10-29 | Discharge: 2022-10-29 | Disposition: A | Discharge status: Home / Self Care | Payer: Commercial Managed Care - HMO | Source: Ambulatory Visit | Attending: Nurse Practitioner | Admitting: Nurse Practitioner

## 2022-10-29 DIAGNOSIS — Z9884 Bariatric surgery status: Secondary | ICD-10-CM | POA: Insufficient documentation

## 2022-10-29 DIAGNOSIS — Z713 Dietary counseling and surveillance: Secondary | ICD-10-CM | POA: Insufficient documentation

## 2022-10-29 DIAGNOSIS — K912 Postsurgical malabsorption, not elsewhere classified: Secondary | ICD-10-CM | POA: Insufficient documentation

## 2022-10-29 LAB — TRANSFERRIN: Transferrin: 268 mg/dL (ref 192–382)

## 2022-10-29 LAB — LACTATE DEHYDROGENASE: LDH: 86 U/L — ABNORMAL LOW (ref 98–192)

## 2022-10-29 LAB — PHOSPHORUS: Phosphorus: 3.6 mg/dL (ref 2.5–4.6)

## 2022-10-29 LAB — HEMOGLOBIN A1C
Hgb A1c MFr Bld: 4.6 % — ABNORMAL LOW (ref 4.8–5.6)
Mean Plasma Glucose: 85.32 mg/dL

## 2022-10-29 LAB — AMYLASE: Amylase: 962 U/L — ABNORMAL HIGH (ref 28–100)

## 2022-10-30 LAB — PREALBUMIN: Prealbumin: 29 mg/dL (ref 18–38)

## 2022-11-02 ENCOUNTER — Telehealth: Payer: Self-pay | Admitting: Internal Medicine

## 2022-11-02 LAB — VITAMIN A: Vitamin A (Retinoic Acid): 65.2 ug/dL — ABNORMAL HIGH (ref 20.1–62.0)

## 2022-11-02 NOTE — Telephone Encounter (Signed)
Per [name] w/ [facility], referral has been closed due to patient not returning their calls to schedule appointment-[name]

## 2022-11-03 LAB — VITAMIN B1: Vitamin B1 (Thiamine): 130.4 nmol/L (ref 66.5–200.0)

## 2022-11-05 LAB — PARATHYROID HORMONE, INTACT (NO CA): PTH: 38 pg/mL (ref 15–65)

## 2022-11-09 NOTE — Progress Notes (Signed)
I have not ordered these labs either

## 2022-11-10 LAB — ZINC: Zinc: 99 ug/dL (ref 44–115)

## 2022-11-10 LAB — COPPER, SERUM: Copper: 108 ug/dL (ref 80–158)

## 2022-11-29 ENCOUNTER — Other Ambulatory Visit
Admission: RE | Admit: 2022-11-29 | Discharge: 2022-11-29 | Disposition: A | Discharge status: Home / Self Care | Payer: Commercial Managed Care - HMO | Attending: Internal Medicine | Admitting: Internal Medicine

## 2022-11-29 DIAGNOSIS — E039 Hypothyroidism, unspecified: Secondary | ICD-10-CM | POA: Diagnosis present

## 2022-11-29 LAB — T4, FREE: Free T4: 1.64 ng/dL — ABNORMAL HIGH (ref 0.61–1.12)

## 2022-11-29 LAB — TSH: TSH: 0.227 u[IU]/mL — ABNORMAL LOW (ref 0.350–4.500)

## 2022-12-01 ENCOUNTER — Encounter: Payer: Self-pay | Admitting: Internal Medicine

## 2022-12-01 LAB — T3: T3, Total: 85 ng/dL (ref 71–180)

## 2022-12-16 ENCOUNTER — Encounter: Payer: Self-pay | Admitting: Otolaryngology

## 2023-01-22 ENCOUNTER — Observation Stay
Admission: EM | Admit: 2023-01-22 | Discharge: 2023-01-23 | Disposition: A | Discharge status: Home / Self Care | Payer: 59 | Attending: Internal Medicine | Admitting: Internal Medicine

## 2023-01-22 ENCOUNTER — Observation Stay: Payer: 59

## 2023-01-22 ENCOUNTER — Other Ambulatory Visit: Payer: Self-pay

## 2023-01-22 ENCOUNTER — Emergency Department: Payer: 59

## 2023-01-22 DIAGNOSIS — I2694 Multiple subsegmental pulmonary emboli without acute cor pulmonale: Secondary | ICD-10-CM | POA: Diagnosis not present

## 2023-01-22 DIAGNOSIS — I2699 Other pulmonary embolism without acute cor pulmonale: Secondary | ICD-10-CM | POA: Diagnosis present

## 2023-01-22 DIAGNOSIS — Z87891 Personal history of nicotine dependence: Secondary | ICD-10-CM | POA: Diagnosis not present

## 2023-01-22 DIAGNOSIS — E038 Other specified hypothyroidism: Secondary | ICD-10-CM | POA: Diagnosis not present

## 2023-01-22 DIAGNOSIS — Z7982 Long term (current) use of aspirin: Secondary | ICD-10-CM | POA: Diagnosis not present

## 2023-01-22 DIAGNOSIS — I82411 Acute embolism and thrombosis of right femoral vein: Secondary | ICD-10-CM | POA: Diagnosis not present

## 2023-01-22 DIAGNOSIS — G4733 Obstructive sleep apnea (adult) (pediatric): Secondary | ICD-10-CM | POA: Diagnosis present

## 2023-01-22 DIAGNOSIS — E039 Hypothyroidism, unspecified: Secondary | ICD-10-CM | POA: Diagnosis present

## 2023-01-22 DIAGNOSIS — I2693 Single subsegmental pulmonary embolism without acute cor pulmonale: Principal | ICD-10-CM | POA: Insufficient documentation

## 2023-01-22 DIAGNOSIS — I82401 Acute embolism and thrombosis of unspecified deep veins of right lower extremity: Secondary | ICD-10-CM | POA: Insufficient documentation

## 2023-01-22 DIAGNOSIS — E063 Autoimmune thyroiditis: Secondary | ICD-10-CM | POA: Diagnosis not present

## 2023-01-22 DIAGNOSIS — I10 Essential (primary) hypertension: Secondary | ICD-10-CM | POA: Diagnosis present

## 2023-01-22 DIAGNOSIS — Z79899 Other long term (current) drug therapy: Secondary | ICD-10-CM | POA: Diagnosis not present

## 2023-01-22 DIAGNOSIS — E66813 Obesity, class 3: Secondary | ICD-10-CM | POA: Diagnosis present

## 2023-01-22 DIAGNOSIS — Z1152 Encounter for screening for COVID-19: Secondary | ICD-10-CM | POA: Diagnosis not present

## 2023-01-22 DIAGNOSIS — M79661 Pain in right lower leg: Secondary | ICD-10-CM | POA: Diagnosis present

## 2023-01-22 DIAGNOSIS — I739 Peripheral vascular disease, unspecified: Secondary | ICD-10-CM | POA: Diagnosis present

## 2023-01-22 LAB — CBC WITH DIFFERENTIAL/PLATELET
Abs Immature Granulocytes: 0.03 10*3/uL (ref 0.00–0.07)
Basophils Absolute: 0 10*3/uL (ref 0.0–0.1)
Basophils Relative: 0 %
Eosinophils Absolute: 0.1 10*3/uL (ref 0.0–0.5)
Eosinophils Relative: 1 %
HCT: 48.6 % — ABNORMAL HIGH (ref 36.0–46.0)
Hemoglobin: 15.9 g/dL — ABNORMAL HIGH (ref 12.0–15.0)
Immature Granulocytes: 0 %
Lymphocytes Relative: 18 %
Lymphs Abs: 1.6 10*3/uL (ref 0.7–4.0)
MCH: 31.1 pg (ref 26.0–34.0)
MCHC: 32.7 g/dL (ref 30.0–36.0)
MCV: 95.1 fL (ref 80.0–100.0)
Monocytes Absolute: 0.5 10*3/uL (ref 0.1–1.0)
Monocytes Relative: 6 %
Neutro Abs: 6.3 10*3/uL (ref 1.7–7.7)
Neutrophils Relative %: 75 %
Platelets: 238 10*3/uL (ref 150–400)
RBC: 5.11 MIL/uL (ref 3.87–5.11)
RDW: 14.6 % (ref 11.5–15.5)
WBC: 8.6 10*3/uL (ref 4.0–10.5)
nRBC: 0 % (ref 0.0–0.2)

## 2023-01-22 LAB — BASIC METABOLIC PANEL
Anion gap: 10 (ref 5–15)
BUN: 11 mg/dL (ref 6–20)
CO2: 19 mmol/L — ABNORMAL LOW (ref 22–32)
Calcium: 9 mg/dL (ref 8.9–10.3)
Chloride: 106 mmol/L (ref 98–111)
Creatinine, Ser: 0.7 mg/dL (ref 0.44–1.00)
GFR, Estimated: >60 mL/min (ref >60)
Glucose, Bld: 102 mg/dL — ABNORMAL HIGH (ref 70–99)
Potassium: 6.1 mmol/L — ABNORMAL HIGH (ref 3.5–5.1)
Sodium: 135 mmol/L (ref 135–145)

## 2023-01-22 LAB — RESP PANEL BY RT-PCR (RSV, FLU A&B, COVID)  RVPGX2
Influenza A by PCR: NEGATIVE
Influenza B by PCR: NEGATIVE
Resp Syncytial Virus by PCR: NEGATIVE
SARS Coronavirus 2 by RT PCR: NEGATIVE

## 2023-01-22 LAB — TROPONIN I (HIGH SENSITIVITY)
Troponin I (High Sensitivity): 3 ng/L (ref <18)
Troponin I (High Sensitivity): 4 ng/L (ref <18)

## 2023-01-22 LAB — POTASSIUM: Potassium: 3.7 mmol/L (ref 3.5–5.1)

## 2023-01-22 LAB — PROTIME-INR
INR: 1.2 (ref 0.8–1.2)
Prothrombin Time: 14.8 seconds (ref 11.4–15.2)

## 2023-01-22 LAB — BRAIN NATRIURETIC PEPTIDE: B Natriuretic Peptide: 18.5 pg/mL (ref 0.0–100.0)

## 2023-01-22 MED ORDER — POTASSIUM CHLORIDE CRYS ER 20 MEQ PO TBCR
10.0000 meq | EXTENDED_RELEASE_TABLET | Freq: Every day | ORAL | Status: DC
  Filled 2023-01-22: qty 1

## 2023-01-22 MED ORDER — IOHEXOL 350 MG/ML SOLN
100.0000 mL | Freq: Once | INTRAVENOUS | Status: AC | PRN
  Administered 2023-01-22: 100 mL via INTRAVENOUS

## 2023-01-22 MED ORDER — SODIUM CHLORIDE 0.9 % IV BOLUS
500.0000 mL | Freq: Once | INTRAVENOUS | Status: AC
  Administered 2023-01-22: 500 mL via INTRAVENOUS

## 2023-01-22 MED ORDER — CYANOCOBALAMIN 500 MCG PO TABS
1000.0000 ug | ORAL_TABLET | Freq: Every day | ORAL | Status: DC
  Administered 2023-01-23: 1000 ug via ORAL
  Filled 2023-01-22: qty 2

## 2023-01-22 MED ORDER — ACETAMINOPHEN 650 MG RE SUPP: 650.0000 mg | Freq: Four times a day (QID) | RECTAL | Status: DC | PRN

## 2023-01-22 MED ORDER — ACETAMINOPHEN 325 MG PO TABS: 650.0000 mg | ORAL_TABLET | Freq: Four times a day (QID) | ORAL | Status: DC | PRN

## 2023-01-22 MED ORDER — ONDANSETRON HCL 4 MG/2ML IJ SOLN: 4.0000 mg | Freq: Four times a day (QID) | INTRAMUSCULAR | Status: DC | PRN

## 2023-01-22 MED ORDER — LEVOTHYROXINE SODIUM 137 MCG PO TABS
137.0000 ug | ORAL_TABLET | Freq: Every day | ORAL | Status: DC
  Administered 2023-01-23: 137 ug via ORAL
  Filled 2023-01-22: qty 1

## 2023-01-22 MED ORDER — HEPARIN (PORCINE) 25000 UT/250ML-% IV SOLN
1600.0000 [IU]/h | INTRAVENOUS | Status: DC
  Administered 2023-01-22: 1600 [IU]/h via INTRAVENOUS
  Filled 2023-01-22 (×2): qty 250

## 2023-01-22 MED ORDER — ONDANSETRON HCL 4 MG PO TABS: 4.0000 mg | ORAL_TABLET | Freq: Four times a day (QID) | ORAL | Status: DC | PRN

## 2023-01-22 MED ORDER — SODIUM CHLORIDE 0.9% FLUSH: 3.0000 mL | Freq: Two times a day (BID) | INTRAVENOUS | Status: DC

## 2023-01-22 MED ORDER — FOLIC ACID 1 MG PO TABS
1.0000 mg | ORAL_TABLET | Freq: Every day | ORAL | Status: DC
  Administered 2023-01-23: 1 mg via ORAL
  Filled 2023-01-22: qty 1

## 2023-01-22 MED ORDER — IVERMECTIN 3 MG PO TABS: 24.0000 mg | ORAL_TABLET | Freq: Every day | ORAL | Status: DC

## 2023-01-22 MED ORDER — HEPARIN BOLUS VIA INFUSION
6500.0000 [IU] | Freq: Once | INTRAVENOUS | Status: AC
  Administered 2023-01-22: 6500 [IU] via INTRAVENOUS
  Filled 2023-01-22: qty 6500

## 2023-01-22 NOTE — Assessment & Plan Note (Addendum)
Patient presenting with right leg pain found to have acute DVT and bilateral subsegmental PE.  She is hemodynamically stable.  Workup for evidence of heart strain is still pending. PESI score = 54, very low risk for mortality.   Per chart review, patient has a history of prior renal infarct, with hypercoagulable workup at that time negative.  In addition, workup for vasculitis was negative.  At this time, potentially provoked in the setting of sedentary lifestyle.  Uncertain if daily vitamin K supplementation would play a role, but will check PT/INR PTT.  Low suspicion that Mirena is playing a role.  - Continue IV heparin per pharmacy dosing - Plan to transition to Eliquis versus Xarelto in the morning if no evidence of hemodynamic compromise - Telemetry monitoring - PTT and PT/INR pending - BNP and troponin pending - If the above are elevated, will plan to obtain echo - Given left leg pain, will obtain left lower extremity Doppler

## 2023-01-22 NOTE — ED Triage Notes (Signed)
Pt presents to the ED via POV due to L pain that started 2 days ago. Pt states the pain is worse when the leg is hanging down. Pt states she has a hx of blood clots. Pt A&Ox4

## 2023-01-22 NOTE — ED Notes (Signed)
Lab work sent at this time

## 2023-01-22 NOTE — ED Provider Notes (Signed)
[facility] Provider Note    Event Date/Time   First MD Initiated Contact with Patient 01/22/23 1542     (approximate)   History   Leg Pain   HPI  [name] is a 57 y.o. female presents the ER for evaluation of right lower extremity pain worse with standing over the past few days as well as some exertional shortness of breath.  Does have history of DVT previously on blood thinners not currently.  She does have Mirena IUD.  States that she is largely bedbound due to obesity and from long COVID symptoms.     Physical Exam   Triage Vital Signs: ED Triage Vitals  Enc Vitals Group     BP 01/22/23 1457 (!) 121/93     Pulse Rate 01/22/23 1457 98     Resp 01/22/23 1457 18     Temp 01/22/23 1457 99.7 F (37.6 C)     Temp Source 01/22/23 1457 Oral     SpO2 01/22/23 1457 97 %     Weight 01/22/23 1456 286 lb 9.6 oz (130 kg)     Height 01/22/23 1456 5' 7"$  (1.702 m)     Head Circumference --      Peak Flow --      Pain Score 01/22/23 1455 3     Pain Loc --      Pain Edu? --      Excl. in [place]? --     Most recent vital signs: Vitals:   01/22/23 1830 01/22/23 1900  BP: 106/61 111/64  Pulse: 82 81  Resp:  18  Temp:  98.7 F (37.1 C)  SpO2: 100% 100%     Constitutional: Alert  Eyes: Conjunctivae are normal.  Head: Atraumatic. Nose: No congestion/rhinnorhea. Mouth/Throat: Mucous membranes are moist.   Neck: Painless ROM.  Cardiovascular:   Good peripheral circulation. Respiratory: Normal respiratory effort.  No retractions.  Gastrointestinal: Soft and nontender.  Musculoskeletal:  no deformity, compartments are soft. Neurologic:  MAE spontaneously. No gross focal neurologic deficits are appreciated.  Skin:  Skin is warm, dry and intact. No rash noted. Psychiatric: Mood and affect are normal. Speech and behavior are normal.    ED Results / Procedures / Treatments   Labs (all labs ordered are listed, but only abnormal results are  displayed) Labs Reviewed  CBC WITH DIFFERENTIAL/PLATELET - Abnormal; Notable for the following components:      Result Value   Hemoglobin 15.9 (*)    HCT 48.6 (*)    All other components within normal limits  BASIC METABOLIC PANEL - Abnormal; Notable for the following components:   Potassium 6.1 (*)    CO2 19 (*)    Glucose, Bld 102 (*)    All other components within normal limits  RESP PANEL BY RT-PCR (RSV, FLU A&B, COVID)  [id number]  POTASSIUM  BRAIN NATRIURETIC PEPTIDE  TROPONIN I (HIGH SENSITIVITY)     EKG  ED ECG REPORT I, Merlyn Lot, the attending physician, personally viewed and interpreted this ECG.   Date: 01/22/2023  EKG Time: 19:07  Rate: 85  Rhythm: sinus  Axis: normal  Intervals: normal  ST&T Change:  no stemi, no depressions    RADIOLOGY Please see ED Course for my review and interpretation.  I personally reviewed all radiographic images ordered to evaluate for the above acute complaints and reviewed radiology reports and findings.  These findings were personally discussed with the patient.  Please see medical record for radiology  report.    PROCEDURES:  Critical Care performed: yes  .Critical Care  Performed by: Merlyn Lot, MD Authorized by: Merlyn Lot, MD   Critical care provider statement:    Critical care time (minutes):  35   Critical care was necessary to treat or prevent imminent or life-threatening deterioration of the following conditions:  Circulatory failure   Critical care was time spent personally by me on the following activities:  Ordering and performing treatments and interventions, ordering and review of laboratory studies, ordering and review of radiographic studies, pulse oximetry, re-evaluation of patient's condition, review of old charts, obtaining history from patient or surrogate, examination of patient, evaluation of patient's response to treatment, discussions with primary provider, discussions with  consultants and development of treatment plan with patient or surrogate    MEDICATIONS ORDERED IN ED: Medications  sodium chloride 0.9 % bolus 500 mL (500 mLs Intravenous New Bag/Given 01/22/23 1823)  iohexol (OMNIPAQUE) 350 MG/ML injection 100 mL (100 mLs Intravenous Contrast Given 01/22/23 1935)     IMPRESSION / MDM / [place] / ED COURSE  I reviewed the triage vital signs and the nursing notes.                              Differential diagnosis includes, but is not limited to, DVT, PE, cellulitis, electrolyte abnormality  Patient presenting to the ER for evaluation of symptoms as described above.  Based on symptoms, risk factors and considered above differential, this presenting complaint could reflect a potentially life-threatening illness therefore the patient will be placed on continuous pulse oximetry and telemetry for monitoring.  Laboratory evaluation will be sent to evaluate for the above complaints.  Ultrasound ordered out of triage does show evidence of DVT occlusive.  She does not have any cerulea dolens but she is complaining of some shortness of breath will do blood work as well as CTA.   Clinical Course as of 01/22/23 1956  Sat Jan 22, 2023  1849 Patient's potassium is elevated to 6.1.  Suspect hemolysis based on labs.  And as she was a very difficult stick.  Awaiting CTA. [PR]  1941 CTA on my review and interpretation does show evidence of PE.  And her clot burden with evidence of PE will initiate heparin infusion.  Repeat potassium is normal.  Will consult hospitalist for admission. [PR]    Clinical Course User Index [PR] Merlyn Lot, MD      FINAL CLINICAL IMPRESSION(S) / ED DIAGNOSES   Final diagnoses:  Acute deep vein thrombosis (DVT) of femoral vein of right lower extremity ([place])  Single subsegmental pulmonary embolism without acute cor pulmonale ([place])     Rx / DC Orders   ED Discharge Orders     None        Note:  This document  was prepared using Dragon voice recognition software and may include unintentional dictation errors.    Merlyn Lot, MD 01/22/23 [phone]

## 2023-01-22 NOTE — ED Notes (Signed)
Pts visitor is going to get pts CPAP for overnight use. Pt placed on SpO2, cardiac, and BP monitor. Denies any needs at this time.

## 2023-01-22 NOTE — H&P (Signed)
History and Physical    Patient: [name] [id number] DOB: [birth year] DOA: 01/22/2023 DOS: the patient was seen and examined on 01/22/2023 PCP: Lavera Guise, MD  Patient coming from: Home  Chief Complaint:  Chief Complaint  Patient presents with   Leg Pain   HPI: [name] is a 57 y.o. female with medical history significant of hypertension, obesity s/p gastric sleeve, hypothyroidism, OSA on CPAP, ulcerative colitis s/p total colectomy, Raynaud's syndrome, Buerger's disease, who presents to the ED with complaints of leg pain.  [name] states that approximately 2 days ago, she began to develop right lower extremity pain that was predominantly in her calf but extended up to the back of her knee.  In addition, she experienced some pain on her left but states it was not as severe and intermittent, however right calf pain has progressively worsened.  The pain is significantly worsened with any movement or palpation.  She has not noticed any erythema or increase in her leg size.  She endorses 1 day of increased dyspnea on exertion which she initially thought it may have been due to dehydration.  She denies any shortness of breath at rest, chest pain, palpitations.  Patient denies any recent surgery or long trips.  She states she is relatively bedbound at baseline due to chronic health problems.  In addition, she has been taking daily over-the-counter vitamin K for bone health.  She has a Mirena IUD in place that was placed 6 to 7 years ago.  She does not take any oral contraceptives.  ED course: On arrival to the ED, patient was normotensive at 121/93 with heart rate of 98.  She was saturating at 100% on room air.  She is afebrile at 98.7. Initial workup remarkable for hemoglobin of 15.9, bicarb of 19, glucose of 102 and GFR above 60.  Potassium initially elevated at 6.1, however hemolysis present; repeat normal at 3.7.  Right lower extremity Doppler obtained that demonstrated occlusive  DVT within the right distal femoral, popliteal and proximal posterior tibial veins.  CTA was obtained that demonstrated bilateral nonocclusive pulmonary emboli.  Patient was started on IV heparin and [facility] contacted for admission.  Review of Systems: As mentioned in the history of present illness. All other systems reviewed and are negative. Past Medical History:  Diagnosis Date   Berger's disease 2011   Buerger's disease ([place])    Family history of adverse reaction to anesthesia    sister and daughter difficult to wake up   Folic acid deficiency    GERD (gastroesophageal reflux disease)    Hypertension    Hypothyroidism    IDA (iron deficiency anemia)    Osteoarthritis    PVC's (premature ventricular contractions)    Thrombocytosis    Past Surgical History:  Procedure Laterality Date   ADENOIDECTOMY     Small child   APPENDECTOMY     Age 89   COLON SURGERY  1980   ileostomy and internal pouch age 61   CORONARY/GRAFT ACUTE MI REVASCULARIZATION N/A 06/12/2021   Procedure: Coronary/Graft Acute MI Revascularization;  Surgeon: Isaias Cowman, MD;  Location: [facility];  Service: Cardiovascular;  Laterality: N/A;   DILATATION & CURETTAGE/HYSTEROSCOPY WITH MYOSURE N/A 05/02/2015   Procedure: DILATATION & CURETTAGE/HYSTEROSCOPY WITH MYOSURE;  Surgeon: Malachy Mood, MD;  Location: [facility];  Service: Gynecology;  Laterality: N/A;   LAPAROSCOPIC GASTRIC SLEEVE RESECTION  07/06/2016   LEFT HEART CATH AND CORONARY ANGIOGRAPHY N/A 06/12/2021   Procedure: LEFT  HEART CATH AND CORONARY ANGIOGRAPHY;  Surgeon: Isaias Cowman, MD;  Location: [facility];  Service: Cardiovascular;  Laterality: N/A;   Social History:  reports that she quit smoking about 12 years ago. Her smoking use included cigarettes. She has never used smokeless tobacco. She reports that she does not drink alcohol and does not use drugs.  Allergies  Allergen Reactions   Yeast-Related Products    Tape  Rash    Some tapes cause a rash, possible the paper tape.    Family History  Problem Relation Age of Onset   Clotting disorder Mother    Hypertension Mother    Congestive Heart Failure Father    Hypertension Father    Stroke Sister    Rheum arthritis Sister    Congestive Heart Failure Maternal Grandmother    Colon cancer Maternal Grandfather 85   Skin cancer Maternal Aunt        Basal cell   Skin cancer Maternal Uncle        Basal cell   Skin cancer Cousin        Basal Cell    Prior to Admission medications   Medication Sig Start Date End Date Taking? Authorizing Provider  ASPIRIN LOW DOSE 81 MG tablet TAKE 2 TABLETS BY MOUTH DAILY 09/20/22   Lavera Guise, MD  ciprofloxacin (CIPRO) 500 MG tablet For 5 days. 10/01/22   Lavera Guise, MD  Cranberry 1000 MG CAPS Take 1 tablet by mouth daily.    [provider]  famotidine (PEPCID) 20 MG tablet TAKE ONE TABLET BY MOUTH TWICE A DAY 11/30/21   Lavera Guise, MD  folic acid (FOLVITE) 1 MG tablet TAKE 1 TABLET BY MOUTH EVERY DAY 07/25/22   Lavera Guise, MD  ivermectin (STROMECTOL) 3 MG TABS tablet Take 12 mg by mouth once. Take 2 tablets twice daily.    [provider]  levonorgestrel (MIRENA) 20 MCG/24HR IUD by Intrauterine route.    [provider]  levothyroxine (SYNTHROID) 150 MCG tablet Take 1 tablet (150 mcg total) by mouth daily. 09/28/22   Lavera Guise, MD  MAGNESIUM CHLORIDE PO Take 143 mg by mouth. Two tablets daily    [provider]  Misc. Devices (PULSE OXIMETER) MISC Use as directed on finger to check oxygen level daily  DX: I73.1 10/26/21   Lavera Guise, MD  Nattokinase 100 MG CAPS Take 100 mg by mouth. Once daily    [provider]  Omega-3 Fatty Acids (FISH OIL) 1360 MG CAPS Take 1,400 mg by mouth.    [provider]  potassium chloride (KLOR-CON M) 10 MEQ tablet Take 1 tablet (10 mEq total) by mouth daily. Except for 1 day per week take 2 tablets. 04/12/22    McDonough, Si Gaul, PA-C  Probiotic Product (PROBIOTIC ADVANCED PO) Take 1 capsule by mouth daily.    [provider]  terbinafine (LAMISIL) 250 MG tablet Take 1 tablet (250 mg total) by mouth daily. 09/28/22   Felipa Furnace, DPM    Physical Exam: Vitals:   01/22/23 1456 01/22/23 1457 01/22/23 1830 01/22/23 1900  BP:  (!) 121/93 106/61 111/64  Pulse:  98 82 81  Resp:  18  18  Temp:  99.7 F (37.6 C)  98.7 F (37.1 C)  TempSrc:  Oral  Oral  SpO2:  97% 100% 100%  Weight: 130 kg     Height: 5' 7"$  (1.702 m)      Physical Exam  Vitals and nursing note reviewed.  Constitutional:      General: She is not in acute distress.    Appearance: She is obese. She is not toxic-appearing.  HENT:     Head: Normocephalic and atraumatic.     Mouth/Throat:     Mouth: Mucous membranes are moist.     Pharynx: Oropharynx is clear.  Eyes:     Conjunctiva/sclera: Conjunctivae normal.     Pupils: Pupils are equal, round, and reactive to light.  Cardiovascular:     Rate and Rhythm: Normal rate and regular rhythm.     Heart sounds: No murmur heard.    No gallop.  Pulmonary:     Effort: Pulmonary effort is normal. No respiratory distress.     Breath sounds: Normal breath sounds. No wheezing, rhonchi or rales.  Abdominal:     General: Bowel sounds are normal.  Musculoskeletal:     Right lower leg: No edema.     Left lower leg: No edema.  Skin:    General: Skin is warm and dry.     Findings: No erythema or rash.     Comments: Right lower extremity notably warmer than left  Neurological:     General: No focal deficit present.     Mental Status: She is alert and oriented to person, place, and time. Mental status is at baseline.  Psychiatric:        Mood and Affect: Mood normal.        Behavior: Behavior normal.    Data Reviewed: CBC with WBC of 8.6, hemoglobin of 15.9, MCV 95, platelets of 238 BMP with sodium of 135, potassium 6.1, bicarb 19, glucose 102, creatinine 0.70 with GFR  above 60.  Repeat potassium level within normal limits at 3.7 COVID-19, influenza and RSV PCR negative  EKG personally reviewed. Sinus rhythm with rate of 85.  No ST or T wave changes concerning for acute ischemia.  CT Angio Chest PE W and/or Wo Contrast  Result Date: 01/22/2023 CLINICAL DATA:  Left leg pain beginning 2 days ago. History of blood clots. EXAM: CT ANGIOGRAPHY CHEST WITH CONTRAST TECHNIQUE: Multidetector CT imaging of the chest was performed using the standard protocol during bolus administration of intravenous contrast. Multiplanar CT image reconstructions and MIPs were obtained to evaluate the vascular anatomy. RADIATION DOSE REDUCTION: This exam was performed according to the departmental dose-optimization program which includes automated exposure control, adjustment of the mA and/or kV according to patient size and/or use of iterative reconstruction technique. CONTRAST:  192m OMNIPAQUE IOHEXOL 350 MG/ML SOLN COMPARISON:  CT a chest, 07/03/2021. FINDINGS: Cardiovascular: Bilateral pulmonary emboli. Emboli are most evident on the left. Nonocclusive pulmonary emboli extend from the left lower lobe pulmonary artery into segmental branches including a lingular segmental branch. On the right, nonocclusive pulmonary embolus noted in the right upper lobe pulmonary artery at the apical segmental branch point. Heart is normal in size and configuration. No pericardial effusion. Great vessels are normal in caliber. No aortic dissection. Minimal aortic atherosclerosis. Arch branch vessels are widely patent. Mediastinum/Nodes: No neck base, mediastinal or hilar masses or enlarged lymph nodes. Trachea and esophagus are unremarkable. Lungs/Pleura: Lungs demonstrate mosaic type perfusion similar to the prior chest CTA, suspected to be due to small airways disease/air trapping. No convincing pneumonia and no pulmonary edema. No pleural effusion or pneumothorax. No evidence of an infarct. Upper Abdomen:  Previous gastric surgery.  No acute findings. Musculoskeletal: No fracture or acute finding. No bone lesion. No chest wall  mass. Review of the MIP images confirms the above findings. IMPRESSION: 1. Bilateral nonocclusive pulmonary emboli, most evident on the left, where emboli extend from the lower lobe pulmonary artery into segmental branches. Small nonocclusive right upper lobe pulmonary embolism. 2. No other evidence of an acute abnormality. No evidence of pneumonia or pulmonary edema. 3. Minimal aortic atherosclerosis. Aortic Atherosclerosis (ICD10-I70.0). Electronically Signed   By: Lajean Manes M.D.   On: 01/22/2023 19:51   US Venous Img Lower Unilateral Right  Result Date: 01/22/2023 CLINICAL DATA:  57 year old female with RIGHT leg pain. EXAM: RIGHT LOWER EXTREMITY VENOUS DOPPLER ULTRASOUND TECHNIQUE: Gray-scale sonography with compression, as well as color and duplex ultrasound, were performed to evaluate the deep venous system(s) from the level of the common femoral vein through the popliteal and proximal calf veins. COMPARISON:  None Available. FINDINGS: VENOUS Occlusive DVT is identified within the distal femoral vein, the popliteal vein and the proximal posterior tibial vein. Other calf veins are not well visualized. There is no evidence of DVT within the RIGHT common femoral or per of find of femoral veins. Limited views of the LEFT common femoral vein are unremarkable. OTHER None. Limitations: none IMPRESSION: Occlusive DVT within the RIGHT distal femoral, popliteal and proximal posterior tibial veins. Critical Value/emergent results were called by telephone at the time of interpretation on 01/22/2023 at 4:25 pm to provider Merlyn Lot , who verbally acknowledged these results. Electronically Signed   By: Margarette Canada M.D.   On: 01/22/2023 16:25    Results are pending, will review when available.  Assessment and Plan:  * Pulmonary embolism [facility]) Patient presenting with right leg pain  found to have acute DVT and bilateral subsegmental PE.  She is hemodynamically stable.  Workup for evidence of heart strain is still pending. PESI score = 54, very low risk for mortality.   Per chart review, patient has a history of prior renal infarct, with hypercoagulable workup at that time negative.  In addition, workup for vasculitis was negative.  At this time, potentially provoked in the setting of sedentary lifestyle.  Uncertain if daily vitamin K supplementation would play a role, but will check PT/INR PTT.  Low suspicion that Mirena is playing a role.  - Continue IV heparin per pharmacy dosing - Plan to transition to Eliquis versus Xarelto in the morning if no evidence of hemodynamic compromise - Telemetry monitoring - PTT and PT/INR pending - BNP and troponin pending - If the above are elevated, will plan to obtain echo - Given left leg pain, will obtain left lower extremity Doppler  Obstructive sleep apnea - Continue CPAP at bedtime  Essential hypertension No longer on any antihypertensives in the setting of intentional weight loss.  Hypothyroid Patient states that her home levothyroxine has been gradually adjusted and on her last check, she was hyperthyroid.  Given persistent hyperthyroid state can increase risk of PE, will recheck thyroid function test at this time.  - Continue home Synthroid for now - TSH and free T4 in the a.m.  Advance Care Planning:   Code Status: Full Code verified by patient  Consults: None  Family Communication: Patient's husband updated at bedside  Severity of Illness: The appropriate patient status for this patient is OBSERVATION. Observation status is judged to be reasonable and necessary in order to provide the required intensity of service to ensure the patient's safety. The patient's presenting symptoms, physical exam findings, and initial radiographic and laboratory data in the context of their medical condition is felt  to place them at  decreased risk for further clinical deterioration. Furthermore, it is anticipated that the patient will be medically stable for discharge from the hospital within 2 midnights of admission.   Author: Jose Persia, MD 01/22/2023 9:06 PM  For on call review [email or web address].

## 2023-01-22 NOTE — Assessment & Plan Note (Signed)
No longer on any antihypertensives in the setting of intentional weight loss.

## 2023-01-22 NOTE — Assessment & Plan Note (Signed)
Continue CPAP at bedtime 

## 2023-01-22 NOTE — Assessment & Plan Note (Signed)
Patient states that her home levothyroxine has been gradually adjusted and on her last check, she was hyperthyroid.  Given persistent hyperthyroid state can increase risk of PE, will recheck thyroid function test at this time.  - Continue home Synthroid for now - TSH and free T4 in the a.m.

## 2023-01-22 NOTE — Consult Note (Signed)
ANTICOAGULATION CONSULT NOTE - Initial Consult  Pharmacy Consult for heparin infusion Indication: pulmonary embolus and DVT  Allergies  Allergen Reactions   Yeast-Related Products    Tape Rash    Some tapes cause a rash, possible the paper tape.    Patient Measurements: Height: 5' 7"$  (170.2 cm) Weight: 130 kg (286 lb 9.6 oz) IBW/kg (Calculated) : 61.6 Heparin Dosing Weight: 92.9 kg  Vital Signs: Temp: 98.7 F (37.1 C) (02/10 1900) Temp Source: Oral (02/10 1900) BP: 111/64 (02/10 1900) Pulse Rate: 81 (02/10 1900)  Labs: Recent Labs    01/22/23 1625  HGB 15.9*  HCT 48.6*  PLT 238  CREATININE 0.70    Estimated Creatinine Clearance: 112.9 mL/min (by C-G formula based on SCr of 0.7 mg/dL).   Medical History: Past Medical History:  Diagnosis Date   Berger's disease 2011   Buerger's disease ([place])    Family history of adverse reaction to anesthesia    sister and daughter difficult to wake up   Folic acid deficiency    GERD (gastroesophageal reflux disease)    Hypertension    Hypothyroidism    IDA (iron deficiency anemia)    Osteoarthritis    PVC's (premature ventricular contractions)    Thrombocytosis     Medications:  PTA: aspirin 24m PO QD Inpatient: Heparin infusion (2/10 >>) Allergies: NKDA  Assessment: 57year old female with history of DVT, obesity and largely bedbound presents to ED with complaints of leg pain. CTA positive for Bilateral nonocclusive pulmonary emboli, UKoreadoppler positive for occlusive DVT within the RIGHT distal femoral, popliteal and proximal posterior tibial veins. Pharmacy consulted for management of heparin infusion in the setting of DVT and PE  Goal of Therapy:  Heparin level 0.3-0.7 units/ml Monitor platelets by anticoagulation protocol: Yes   Date Time HL Rate/Comment   Plan:  Give 6500 units bolus x1; then start heparin infusion at 1600 units/hr Check anti-Xa level in 6 hours and daily once consecutively  therapeutic. Continue to monitor H&H and platelets daily while on heparin gtt.  ADarrick Penna2/09/2023,8:02 PM

## 2023-01-23 DIAGNOSIS — E063 Autoimmune thyroiditis: Secondary | ICD-10-CM | POA: Diagnosis not present

## 2023-01-23 DIAGNOSIS — I82401 Acute embolism and thrombosis of unspecified deep veins of right lower extremity: Secondary | ICD-10-CM

## 2023-01-23 DIAGNOSIS — I2694 Multiple subsegmental pulmonary emboli without acute cor pulmonale: Secondary | ICD-10-CM | POA: Diagnosis not present

## 2023-01-23 DIAGNOSIS — E038 Other specified hypothyroidism: Secondary | ICD-10-CM | POA: Diagnosis not present

## 2023-01-23 LAB — CBC
HCT: 41.3 % (ref 36.0–46.0)
Hemoglobin: 13.8 g/dL (ref 12.0–15.0)
MCH: 31.4 pg (ref 26.0–34.0)
MCHC: 33.4 g/dL (ref 30.0–36.0)
MCV: 93.9 fL (ref 80.0–100.0)
Platelets: 218 10*3/uL (ref 150–400)
RBC: 4.4 MIL/uL (ref 3.87–5.11)
RDW: 14.5 % (ref 11.5–15.5)
WBC: 7.9 10*3/uL (ref 4.0–10.5)
nRBC: 0 % (ref 0.0–0.2)

## 2023-01-23 LAB — PROTIME-INR
INR: 1.2 (ref 0.8–1.2)
Prothrombin Time: 15.1 seconds (ref 11.4–15.2)

## 2023-01-23 LAB — HEPARIN LEVEL (UNFRACTIONATED): Heparin Unfractionated: 0.59 IU/mL (ref 0.30–0.70)

## 2023-01-23 LAB — TSH: TSH: 0.593 u[IU]/mL (ref 0.350–4.500)

## 2023-01-23 LAB — T4, FREE: Free T4: 1.42 ng/dL — ABNORMAL HIGH (ref 0.61–1.12)

## 2023-01-23 LAB — HIV ANTIBODY (ROUTINE TESTING W REFLEX): HIV Screen 4th Generation wRfx: NONREACTIVE

## 2023-01-23 MED ORDER — APIXABAN 5 MG PO TABS
10.0000 mg | ORAL_TABLET | Freq: Two times a day (BID) | ORAL | Status: DC
  Administered 2023-01-23: 10 mg via ORAL
  Filled 2023-01-23: qty 2

## 2023-01-23 MED ORDER — APIXABAN (ELIQUIS) VTE STARTER PACK (10MG AND 5MG): ORAL_TABLET | ORAL | 0 refills | Status: DC

## 2023-01-23 MED ORDER — APIXABAN 5 MG PO TABS: 5.0000 mg | ORAL_TABLET | Freq: Two times a day (BID) | ORAL | Status: DC

## 2023-01-23 MED ORDER — IVERMECTIN 3 MG PO TABS
24.0000 mg | ORAL_TABLET | Freq: Every day | ORAL | Status: DC
  Administered 2023-01-23: 24 mg via ORAL

## 2023-01-23 NOTE — Consult Note (Signed)
ANTICOAGULATION CONSULT NOTE  Pharmacy Consult for heparin infusion Indication: pulmonary embolus and DVT  Allergies  Allergen Reactions   Tape Rash    Some tapes cause a rash, possible the paper tape.    Patient Measurements: Height: 5' 7"$  (170.2 cm) Weight: 130 kg (286 lb 9.6 oz) IBW/kg (Calculated) : 61.6 Heparin Dosing Weight: 92.9 kg  Vital Signs: Temp: 98.7 F (37.1 C) (02/11 0240) Temp Source: Oral (02/11 0240) BP: 101/61 (02/11 0240) Pulse Rate: 97 (02/11 0240)  Labs: Recent Labs    01/22/23 1625 01/22/23 1915 01/22/23 2029 01/22/23 2229 01/23/23 0201  HGB 15.9*  --   --   --  13.8  HCT 48.6*  --   --   --  41.3  PLT 238  --   --   --  218  LABPROT  --   --  14.8  --  15.1  INR  --   --  1.2  --  1.2  HEPARINUNFRC  --   --   --   --  0.59  CREATININE 0.70  --   --   --   --   TROPONINIHS  --  3  --  4  --      Estimated Creatinine Clearance: 112.9 mL/min (by C-G formula based on SCr of 0.7 mg/dL).   Medical History: Past Medical History:  Diagnosis Date   Berger's disease 2011   Buerger's disease ([place])    Family history of adverse reaction to anesthesia    sister and daughter difficult to wake up   Folic acid deficiency    GERD (gastroesophageal reflux disease)    Hypertension    Hypothyroidism    IDA (iron deficiency anemia)    Osteoarthritis    PVC's (premature ventricular contractions)    Thrombocytosis     Medications:  PTA: aspirin 71m PO QD Inpatient: Heparin infusion (2/10 >>) Allergies: NKDA  Assessment: 57year old female with history of DVT, obesity and largely bedbound presents to ED with complaints of leg pain. CTA positive for Bilateral nonocclusive pulmonary emboli, UKoreadoppler positive for occlusive DVT within the RIGHT distal femoral, popliteal and proximal posterior tibial veins. Pharmacy consulted for management of heparin infusion in the setting of DVT and PE  Goal of Therapy:  Heparin level 0.3-0.7 units/ml Monitor  platelets by anticoagulation protocol: Yes   Date Time HL Rate/Comment 2/11 0201 0.59 Therapeutic x 1  Plan:  Continue heparin infusion at 1600 units/hr Recheck anti-Xa level in 6 hours to confirm then daily once consecutively therapeutic. Continue to monitor H&H and platelets daily while on heparin gtt.  NRenda Rolls PharmD, MA[facility]2/10/2023 2:45 AM

## 2023-01-23 NOTE — Discharge Summary (Signed)
Physician Discharge Summary   Patient: [name] MRN: [id number] DOB: 1966/04/01  Admit date:     01/22/2023  Discharge date: 01/23/23  Discharge Physician: Sharen Hones   PCP: Lavera Guise, MD   Recommendations at discharge:   Follow-up with PCP in 1 week.  Discharge Diagnoses: Principal Problem:   Pulmonary embolism (HCC) Active Problems:   Obesity, Class III, BMI 40-49.9 (morbid obesity) ([place])   Hypothyroid   Essential hypertension   Peripheral vascular disease (HCC)   Obstructive sleep apnea   Hypothyroidism due to Hashimoto's thyroiditis   Leg DVT (deep venous thromboembolism), acute, right ([place])  Resolved Problems:   * No resolved hospital problems. *  Hospital Course: [name] is a 57 y.o. female with medical history significant of hypertension, obesity s/p gastric sleeve, hypothyroidism, OSA on CPAP, ulcerative colitis s/p total colectomy, Raynaud's syndrome, Buerger's disease, who presents to the ED with complaints of leg pain.  Duplex ultrasound showed a right leg DVT in  RIGHT distal femoral, popliteal and proximal posterior tibial veins.  CT angiogram showed bilateral PE, no central PE. Patient initially was placed on heparin drip, transition to Eliquis.  Patient will be able to discharge today and follow-up with PCP as outpatient.  Assessment and Plan: * Pulmonary embolism ([place]) Right lower extremity DVT. Patient presenting with right leg pain found to have acute DVT and bilateral subsegmental PE.  She is hemodynamically stable.   PESI score = 54, very low risk for mortality.  Patient will be treated with Eliquis.  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  She probably need lifetime anticoagulation.   Obstructive sleep apnea - Continue CPAP at bedtime  Essential hypertension No longer on any antihypertensives in the setting of intentional weight loss.  Hypothyroid Patient states that her home levothyroxine  has been gradually adjusted and on her last check, she was hyperthyroid.   TSH is normal at 0.593, free T4 is 1.42.  Patient recently had a reduced Synthroid dose with PCP, she will follow-up with PCP again in the near future to discuss if a further drop of Synthroid dose is needed.  Of note, increased potassium level was a lab error.      Consultants: None Procedures performed: None  Disposition: Home Diet recommendation:  Discharge Diet Orders (From admission, onward)     Start     Ordered   01/23/23 0000  Diet - low sodium heart healthy        01/23/23 1024           Cardiac diet DISCHARGE MEDICATION: Allergies as of 01/23/2023       Reactions   Tape Rash   Some tapes cause a rash, possible the paper tape.        Medication List     STOP taking these medications    Aspirin Low Dose 81 MG tablet Generic drug: aspirin EC   fluticasone 50 MCG/ACT nasal spray Commonly known as: FLONASE   furosemide 20 MG tablet Commonly known as: LASIX   K2-D3 10,000 10000-45 UNIT-MCG Caps   Nattokinase 100 MG Caps   Pulse Oximeter Misc   terbinafine 250 MG tablet Commonly known as: LamISIL       TAKE these medications    Apixaban Starter Pack (671m and 530m Commonly known as: ELIQUIS STARTER PACK Take as directed on package: start with two-71m371mablets twice daily for 7 days. On day 8, switch to one-71mg64mblet  twice daily.   Cholecalciferol 50 MCG (2000 UT) Caps Take by mouth.   Cranberry 1000 MG Caps Take 1 tablet by mouth 2 (two) times daily.   cyanocobalamin 1000 MCG tablet Commonly known as: VITAMIN B12 Take 1,000 mcg by mouth daily.   famotidine 20 MG tablet Commonly known as: PEPCID TAKE ONE TABLET BY MOUTH TWICE A DAY What changed:  when to take this reasons to take this   Fish Oil 1360 MG Caps Take 1,400 mg by mouth daily.   folic acid 1 MG tablet Commonly known as: FOLVITE TAKE 1 TABLET BY MOUTH EVERY DAY   ivermectin 3 MG Tabs  tablet Commonly known as: STROMECTOL Take 24 mg by mouth daily.   levothyroxine 137 MCG tablet Commonly known as: SYNTHROID Take 137 mcg by mouth daily before breakfast.   MAGNESIUM CHLORIDE PO Take 143 mg by mouth. Two tablets daily   NON FORMULARY Take 1 tablet by mouth daily. [employer]aklee Mens daily vitamin. NO IRON   potassium chloride 10 MEQ CR capsule Commonly known as: MICRO-K Take 10 mEq by mouth daily. Takes two on weds   PROBIOTIC ADVANCED PO Take 1 capsule by mouth daily.        Follow-up Information     Lavera Guise, MD Follow up in 1 week(s).   Specialties: Internal Medicine, Cardiology Contact information: [place] [address] [phone]                Discharge Exam: Danley Danker Weights   01/22/23 1456  Weight: 130 kg   General exam: Appears calm and comfortable, morbidly obese. Respiratory system: Clear to auscultation. Respiratory effort normal. Cardiovascular system: S1 & S2 heard, RRR. No JVD, murmurs, rubs, gallops or clicks. No pedal edema. Gastrointestinal system: Abdomen is nondistended, soft and nontender. No organomegaly or masses felt. Normal bowel sounds heard. Central nervous system: Alert and oriented. No focal neurological deficits. Extremities: Symmetric 5 x 5 power. Skin: No rashes, lesions or ulcers Psychiatry: Judgement and insight appear normal. Mood & affect appropriate.    Condition at discharge: good  The results of significant diagnostics from this hospitalization (including imaging, microbiology, ancillary and laboratory) are listed below for reference.   Imaging Studies: US Venous Img Lower Unilateral Left (DVT)  Result Date: 01/22/2023 CLINICAL DATA:  Pain EXAM: LEFT LOWER EXTREMITY VENOUS DOPPLER ULTRASOUND TECHNIQUE: Gray-scale sonography with compression, as well as color and duplex ultrasound, were performed to evaluate the deep venous system(s) from the level of the common femoral vein through the  popliteal and proximal calf veins. COMPARISON:  None Available. FINDINGS: VENOUS Normal compressibility of the common femoral, superficial femoral, and popliteal veins, as well as the visualized calf veins. Visualized portions of profunda femoral vein and great saphenous vein unremarkable. No filling defects to suggest DVT on grayscale or color Doppler imaging. Doppler waveforms show normal direction of venous flow, normal respiratory plasticity and response to augmentation. Limited views of the contralateral common femoral vein are unremarkable. OTHER None. Limitations: none IMPRESSION: Negative. Electronically Signed   By: Ronney Asters M.D.   On: 01/22/2023 22:18   CT Angio Chest PE W and/or Wo Contrast  Result Date: 01/22/2023 CLINICAL DATA:  Left leg pain beginning 2 days ago. History of blood clots. EXAM: CT ANGIOGRAPHY CHEST WITH CONTRAST TECHNIQUE: Multidetector CT imaging of the chest was performed using the standard protocol during bolus administration of intravenous contrast. Multiplanar CT image reconstructions and MIPs were obtained to evaluate the vascular anatomy. RADIATION DOSE  REDUCTION: This exam was performed according to the departmental dose-optimization program which includes automated exposure control, adjustment of the mA and/or kV according to patient size and/or use of iterative reconstruction technique. CONTRAST:  13m OMNIPAQUE IOHEXOL 350 MG/ML SOLN COMPARISON:  CT a chest, 07/03/2021. FINDINGS: Cardiovascular: Bilateral pulmonary emboli. Emboli are most evident on the left. Nonocclusive pulmonary emboli extend from the left lower lobe pulmonary artery into segmental branches including a lingular segmental branch. On the right, nonocclusive pulmonary embolus noted in the right upper lobe pulmonary artery at the apical segmental branch point. Heart is normal in size and configuration. No pericardial effusion. Great vessels are normal in caliber. No aortic dissection. Minimal aortic  atherosclerosis. Arch branch vessels are widely patent. Mediastinum/Nodes: No neck base, mediastinal or hilar masses or enlarged lymph nodes. Trachea and esophagus are unremarkable. Lungs/Pleura: Lungs demonstrate mosaic type perfusion similar to the prior chest CTA, suspected to be due to small airways disease/air trapping. No convincing pneumonia and no pulmonary edema. No pleural effusion or pneumothorax. No evidence of an infarct. Upper Abdomen: Previous gastric surgery.  No acute findings. Musculoskeletal: No fracture or acute finding. No bone lesion. No chest wall mass. Review of the MIP images confirms the above findings. IMPRESSION: 1. Bilateral nonocclusive pulmonary emboli, most evident on the left, where emboli extend from the lower lobe pulmonary artery into segmental branches. Small nonocclusive right upper lobe pulmonary embolism. 2. No other evidence of an acute abnormality. No evidence of pneumonia or pulmonary edema. 3. Minimal aortic atherosclerosis. Aortic Atherosclerosis (ICD10-I70.0). Electronically Signed   By: DLajean ManesM.D.   On: 01/22/2023 19:51   UKoreaVenous Img Lower Unilateral Right  Result Date: 01/22/2023 CLINICAL DATA:  57year old female with RIGHT leg pain. EXAM: RIGHT LOWER EXTREMITY VENOUS DOPPLER ULTRASOUND TECHNIQUE: Gray-scale sonography with compression, as well as color and duplex ultrasound, were performed to evaluate the deep venous system(s) from the level of the common femoral vein through the popliteal and proximal calf veins. COMPARISON:  None Available. FINDINGS: VENOUS Occlusive DVT is identified within the distal femoral vein, the popliteal vein and the proximal posterior tibial vein. Other calf veins are not well visualized. There is no evidence of DVT within the RIGHT common femoral or per of find of femoral veins. Limited views of the LEFT common femoral vein are unremarkable. OTHER None. Limitations: none IMPRESSION: Occlusive DVT within the RIGHT distal  femoral, popliteal and proximal posterior tibial veins. Critical Value/emergent results were called by telephone at the time of interpretation on 01/22/2023 at 4:25 pm to provider PMerlyn Lot, who verbally acknowledged these results. Electronically Signed   By: JMargarette CanadaM.D.   On: 01/22/2023 16:25    Microbiology: Results for orders placed or performed during the hospital encounter of 01/22/23  Resp panel by RT-PCR (RSV, Flu A&B, Covid) Anterior Nasal Swab     Status: None   Collection Time: 01/22/23  4:46 PM   Specimen: Anterior Nasal Swab  Result Value Ref Range Status   SARS Coronavirus 2 by RT PCR NEGATIVE NEGATIVE Final    Comment: (NOTE) SARS-CoV-2 target nucleic acids are NOT DETECTED.  The SARS-CoV-2 RNA is generally detectable in upper respiratory specimens during the acute phase of infection. The lowest concentration of SARS-CoV-2 viral copies this assay can detect is 138 copies/mL. A negative result does not preclude SARS-Cov-2 infection and should not be used as the sole basis for treatment or other patient management decisions. A negative result may occur with  improper  specimen collection/handling, submission of specimen other than nasopharyngeal swab, presence of viral mutation(s) within the areas targeted by this assay, and inadequate number of viral copies(<138 copies/mL). A negative result must be combined with clinical observations, patient history, and epidemiological information. The expected result is Negative.  Fact Sheet for Patients:  EntrepreneurPulse.com.au  Fact Sheet for Healthcare Providers:  IncredibleEmployment.be  This test is no t yet approved or cleared by the [place] FDA and  has been authorized for detection and/or diagnosis of SARS-CoV-2 by FDA under an Emergency Use Authorization (EUA). This EUA will remain  in effect (meaning this test can be used) for the duration of the COVID-19 declaration  under Section 564(b)(1) of the Act, 21 U.S.C.section 360bbb-3(b)(1), unless the authorization is terminated  or revoked sooner.       Influenza A by PCR NEGATIVE NEGATIVE Final   Influenza B by PCR NEGATIVE NEGATIVE Final    Comment: (NOTE) The Xpert Xpress SARS-CoV-2/FLU/RSV plus assay is intended as an aid in the diagnosis of influenza from Nasopharyngeal swab specimens and should not be used as a sole basis for treatment. Nasal washings and aspirates are unacceptable for Xpert Xpress SARS-CoV-2/FLU/RSV testing.  Fact Sheet for Patients: EntrepreneurPulse.com.au  Fact Sheet for Healthcare Providers: IncredibleEmployment.be  This test is not yet approved or cleared by the [place] FDA and has been authorized for detection and/or diagnosis of SARS-CoV-2 by FDA under an Emergency Use Authorization (EUA). This EUA will remain in effect (meaning this test can be used) for the duration of the COVID-19 declaration under Section 564(b)(1) of the Act, 21 U.S.C. section 360bbb-3(b)(1), unless the authorization is terminated or revoked.     Resp Syncytial Virus by PCR NEGATIVE NEGATIVE Final    Comment: (NOTE) Fact Sheet for Patients: EntrepreneurPulse.com.au  Fact Sheet for Healthcare Providers: IncredibleEmployment.be  This test is not yet approved or cleared by the [place] FDA and has been authorized for detection and/or diagnosis of SARS-CoV-2 by FDA under an Emergency Use Authorization (EUA). This EUA will remain in effect (meaning this test can be used) for the duration of the COVID-19 declaration under Section 564(b)(1) of the Act, 21 U.S.C. section 360bbb-3(b)(1), unless the authorization is terminated or revoked.  Performed at [facility], [address] [place], [place] [address]     Labs: CBC: Recent Labs  Lab 01/22/23 1625 01/23/23 0201  WBC 8.6 7.9  NEUTROABS 6.3   --   HGB 15.9* 13.8  HCT 48.6* 41.3  MCV 95.1 93.9  PLT 238 [id number]   Basic Metabolic Panel: Recent Labs  Lab 01/22/23 1625 01/22/23 1915  NA 135  --   K 6.1* 3.7  CL 106  --   CO2 19*  --   GLUCOSE 102*  --   BUN 11  --   CREATININE 0.70  --   CALCIUM 9.0  --    Liver Function Tests: No results for input(s): "AST", "ALT", "ALKPHOS", "BILITOT", "PROT", "ALBUMIN" in the last 168 hours. CBG: No results for input(s): "GLUCAP" in the last 168 hours.  Discharge time spent: greater than 30 minutes.  Signed: Sharen Hones, MD [facility] 01/23/2023

## 2023-01-23 NOTE — Consult Note (Signed)
[place] for IV Heparin >> Apixaban Indication: pulmonary embolus and DVT  Patient Measurements: Height: 5' 7"$  (170.2 cm) Weight: 130 kg (286 lb 9.6 oz) IBW/kg (Calculated) : 61.6  Labs: Recent Labs    01/22/23 1625 01/22/23 1915 01/22/23 2029 01/22/23 2229 01/23/23 0201  HGB 15.9*  --   --   --  13.8  HCT 48.6*  --   --   --  41.3  PLT 238  --   --   --  218  LABPROT  --   --  14.8  --  15.1  INR  --   --  1.2  --  1.2  HEPARINUNFRC  --   --   --   --  0.59  CREATININE 0.70  --   --   --   --   TROPONINIHS  --  3  --  4  --     Estimated Creatinine Clearance: 112.9 mL/min (by C-G formula based on SCr of 0.7 mg/dL).  Medical History: Past Medical History:  Diagnosis Date   Berger's disease 2011   Buerger's disease ([place])    Family history of adverse reaction to anesthesia    sister and daughter difficult to wake up   Folic acid deficiency    GERD (gastroesophageal reflux disease)    Hypertension    Hypothyroidism    IDA (iron deficiency anemia)    Osteoarthritis    PVC's (premature ventricular contractions)    Thrombocytosis     Medications:  PTA: aspirin 73m PO QD Inpatient: Heparin infusion (2/10 >> 2/11)      Apixaban (2/11 >>) Allergies: NKDA  Assessment: 57year old female with history of DVT, obesity and largely bedbound presents to ED with complaints of leg pain. CTA positive for Bilateral nonocclusive pulmonary emboli, UKoreadoppler positive for occlusive DVT within the RIGHT distal femoral, popliteal and proximal posterior tibial veins. Pharmacy consulted to transition heparin infusion to apixaban in the setting of DVT and PE  Plan:  --Discontinue heparin infusion --Start apixaban 10 mg BID x 7 days followed by apixaban 5 mg BID for remaining duration of therapy --CBC per protocol  ABenita Gutter 01/23/2023 8:29 AM

## 2023-01-23 NOTE — ED Notes (Signed)
Heparin restarted per Dr. Roosevelt Locks until pt received eliquis.

## 2023-01-24 ENCOUNTER — Encounter: Payer: Self-pay | Admitting: Internal Medicine

## 2023-02-02 ENCOUNTER — Other Ambulatory Visit
Admission: RE | Admit: 2023-02-02 | Discharge: 2023-02-02 | Disposition: A | Discharge status: Home / Self Care | Payer: 59 | Source: Ambulatory Visit | Attending: Student in an Organized Health Care Education/Training Program | Admitting: Student in an Organized Health Care Education/Training Program

## 2023-02-02 DIAGNOSIS — M359 Systemic involvement of connective tissue, unspecified: Secondary | ICD-10-CM | POA: Diagnosis present

## 2023-02-02 LAB — IRON AND TIBC
Iron: 93 ug/dL (ref 28–170)
Saturation Ratios: 26 % (ref 10.4–31.8)
TIBC: 361 ug/dL (ref 250–450)
UIBC: 268 ug/dL

## 2023-02-02 LAB — CBC WITH DIFFERENTIAL/PLATELET
Abs Immature Granulocytes: 0.02 10*3/uL (ref 0.00–0.07)
Basophils Absolute: 0 10*3/uL (ref 0.0–0.1)
Basophils Relative: 0 %
Eosinophils Absolute: 0.2 10*3/uL (ref 0.0–0.5)
Eosinophils Relative: 3 %
HCT: 47.7 % — ABNORMAL HIGH (ref 36.0–46.0)
Hemoglobin: 15.5 g/dL — ABNORMAL HIGH (ref 12.0–15.0)
Immature Granulocytes: 0 %
Lymphocytes Relative: 42 %
Lymphs Abs: 3.5 10*3/uL (ref 0.7–4.0)
MCH: 30.3 pg (ref 26.0–34.0)
MCHC: 32.5 g/dL (ref 30.0–36.0)
MCV: 93.3 fL (ref 80.0–100.0)
Monocytes Absolute: 0.5 10*3/uL (ref 0.1–1.0)
Monocytes Relative: 7 %
Neutro Abs: 3.9 10*3/uL (ref 1.7–7.7)
Neutrophils Relative %: 48 %
Platelets: 331 10*3/uL (ref 150–400)
RBC: 5.11 MIL/uL (ref 3.87–5.11)
RDW: 14.1 % (ref 11.5–15.5)
WBC: 8.2 10*3/uL (ref 4.0–10.5)
nRBC: 0 % (ref 0.0–0.2)

## 2023-02-02 LAB — COMPREHENSIVE METABOLIC PANEL
ALT: 19 U/L (ref 0–44)
AST: 19 U/L (ref 15–41)
Albumin: 3.8 g/dL (ref 3.5–5.0)
Alkaline Phosphatase: 73 U/L (ref 38–126)
Anion gap: 7 (ref 5–15)
BUN: 9 mg/dL (ref 6–20)
CO2: 26 mmol/L (ref 22–32)
Calcium: 9.3 mg/dL (ref 8.9–10.3)
Chloride: 104 mmol/L (ref 98–111)
Creatinine, Ser: 0.67 mg/dL (ref 0.44–1.00)
GFR, Estimated: >60 mL/min (ref >60)
Glucose, Bld: 100 mg/dL — ABNORMAL HIGH (ref 70–99)
Potassium: 3.8 mmol/L (ref 3.5–5.1)
Sodium: 137 mmol/L (ref 135–145)
Total Bilirubin: 0.8 mg/dL (ref 0.3–1.2)
Total Protein: 7.6 g/dL (ref 6.5–8.1)

## 2023-02-02 LAB — VITAMIN B12: Vitamin B-12: 359 pg/mL (ref 180–914)

## 2023-02-02 LAB — MAGNESIUM: Magnesium: 1.9 mg/dL (ref 1.7–2.4)

## 2023-02-02 LAB — T4, FREE: Free T4: 1.43 ng/dL — ABNORMAL HIGH (ref 0.61–1.12)

## 2023-02-02 LAB — VITAMIN D 25 HYDROXY (VIT D DEFICIENCY, FRACTURES): Vit D, 25-Hydroxy: 73.89 ng/mL (ref 30–100)

## 2023-02-02 LAB — FOLATE: Folate: 35 ng/mL (ref >5.9)

## 2023-02-02 LAB — TSH: TSH: 3.538 u[IU]/mL (ref 0.350–4.500)

## 2023-02-02 LAB — FERRITIN: Ferritin: 135 ng/mL (ref 11–307)

## 2023-02-03 ENCOUNTER — Other Ambulatory Visit
Admission: RE | Admit: 2023-02-03 | Discharge: 2023-02-03 | Disposition: A | Discharge status: Home / Self Care | Payer: 59 | Source: Ambulatory Visit | Attending: Student in an Organized Health Care Education/Training Program | Admitting: Student in an Organized Health Care Education/Training Program

## 2023-02-03 DIAGNOSIS — M359 Systemic involvement of connective tissue, unspecified: Secondary | ICD-10-CM | POA: Insufficient documentation

## 2023-02-03 LAB — AMYLASE: Amylase: 823 U/L — ABNORMAL HIGH (ref 28–100)

## 2023-02-04 ENCOUNTER — Ambulatory Visit (INDEPENDENT_AMBULATORY_CARE_PROVIDER_SITE_OTHER): Payer: 59 | Admitting: Vascular Surgery

## 2023-02-04 ENCOUNTER — Encounter (INDEPENDENT_AMBULATORY_CARE_PROVIDER_SITE_OTHER): Payer: Self-pay | Admitting: Vascular Surgery

## 2023-02-04 VITALS — BP 124/83 | HR 76 | Resp 18 | Ht 67.5 in | Wt 271.5 lb

## 2023-02-04 DIAGNOSIS — D735 Infarction of spleen: Secondary | ICD-10-CM

## 2023-02-04 DIAGNOSIS — I82401 Acute embolism and thrombosis of unspecified deep veins of right lower extremity: Secondary | ICD-10-CM

## 2023-02-04 DIAGNOSIS — I2694 Multiple subsegmental pulmonary emboli without acute cor pulmonale: Secondary | ICD-10-CM

## 2023-02-04 LAB — T3, FREE: T3, Free: 2.5 pg/mL (ref 2.0–4.4)

## 2023-02-04 NOTE — Assessment & Plan Note (Signed)
Represent a previous thromboembolic event a couple of years ago.  At this point with another thromboembolic event, lifelong anticoagulation is recommended.

## 2023-02-04 NOTE — Assessment & Plan Note (Signed)
she was found to have right lower extremity DVT in the tibial, popliteal, and distal femoral vein.  She also had a CT scan of her chest which I have independently reviewed which demonstrated relatively small volume pulmonary embolus without right heart strain.  This was in the left lower lobe and a small amount in the right upper lobe. She was appropriately started on anticoagulation and tolerating this well.  For that level of DVT, no thrombectomy is indicated.  Elevation, exercise, and compression socks are recommended.  Given her multiple unprovoked embolic phenomenon, I would recommend lifelong anticoagulation.

## 2023-02-04 NOTE — Progress Notes (Signed)
MRN : [id number]  [name] is a 57 y.o. ([birth year]) female who presents with chief complaint of  Chief Complaint  Patient presents with   Follow-up    2-3 week follow up from the ED visit   .  History of Present Illness: Patient returns today in follow up on referral for the emergency department for recent DVT and PE.  We had seen her a couple of years ago and done vascular assessments at that time.  She had been doing reasonably well and actually lost 100 pounds trying to get more healthy.  She was able to come off blood pressure medicines and multiple other things.  She began having pain and swelling in her right leg for a few days and went to the emergency department where she was found to have right lower extremity DVT in the tibial, popliteal, and distal femoral vein.  She also had a CT scan of her chest which I have independently reviewed which demonstrated relatively small volume pulmonary embolus without right heart strain.  This was in the left lower lobe and a small amount in the right upper lobe.  She was appropriately started on anticoagulation and has tolerated that well.  Current Outpatient Medications  Medication Sig Dispense Refill   APIXABAN (ELIQUIS) VTE STARTER PACK ('10MG'$  AND '5MG'$ ) Take as directed on package: start with two-'5mg'$  tablets twice daily for 7 days. On day 8, switch to one-'5mg'$  tablet twice daily. 1 each 0   Cholecalciferol 50 MCG (2000 UT) CAPS Take by mouth.     Cranberry 1000 MG CAPS Take 1 tablet by mouth 2 (two) times daily.     famotidine (PEPCID) 20 MG tablet TAKE ONE TABLET BY MOUTH TWICE A DAY (Patient taking differently: Take 20 mg by mouth daily as needed.) [id number] tablet 2   folic acid (FOLVITE) 1 MG tablet TAKE 1 TABLET BY MOUTH EVERY DAY 90 tablet 3   ivermectin (STROMECTOL) 3 MG TABS tablet Take 24 mg by mouth daily.     levothyroxine (SYNTHROID) 137 MCG tablet Take 137 mcg by mouth daily before breakfast.     MAGNESIUM CHLORIDE PO Take 143 mg by  mouth. Two tablets daily     NON FORMULARY Take 1 tablet by mouth daily. [employer]aklee Mens daily vitamin. NO IRON     Omega-3 Fatty Acids (FISH OIL) 1360 MG CAPS Take 1,400 mg by mouth daily.     potassium chloride (MICRO-K) 10 MEQ CR capsule Take 10 mEq by mouth daily. Takes two on weds     Probiotic Product (PROBIOTIC ADVANCED PO) Take 1 capsule by mouth daily.     cyanocobalamin (VITAMIN B12) 1000 MCG tablet Take 1,000 mcg by mouth daily.     No current facility-administered medications for this visit.    Past Medical History:  Diagnosis Date   Berger's disease 2011   Buerger's disease ([place])    Family history of adverse reaction to anesthesia    sister and daughter difficult to wake up   Folic acid deficiency    GERD (gastroesophageal reflux disease)    Hypertension    Hypothyroidism    IDA (iron deficiency anemia)    Osteoarthritis    PVC's (premature ventricular contractions)    Thrombocytosis     Past Surgical History:  Procedure Laterality Date   ADENOIDECTOMY     Small child   APPENDECTOMY     Age 31   COLON SURGERY  1980   ileostomy and internal pouch age  17   CORONARY/GRAFT ACUTE MI REVASCULARIZATION N/A 06/12/2021   Procedure: Coronary/Graft Acute MI Revascularization;  Surgeon: Isaias Cowman, MD;  Location: [facility];  Service: Cardiovascular;  Laterality: N/A;   DILATATION & CURETTAGE/HYSTEROSCOPY WITH MYOSURE N/A 05/02/2015   Procedure: DILATATION & CURETTAGE/HYSTEROSCOPY WITH MYOSURE;  Surgeon: Malachy Mood, MD;  Location: [facility];  Service: Gynecology;  Laterality: N/A;   LAPAROSCOPIC GASTRIC SLEEVE RESECTION  07/06/2016   LEFT HEART CATH AND CORONARY ANGIOGRAPHY N/A 06/12/2021   Procedure: LEFT HEART CATH AND CORONARY ANGIOGRAPHY;  Surgeon: Isaias Cowman, MD;  Location: [facility];  Service: Cardiovascular;  Laterality: N/A;     Social History   Tobacco Use   Smoking status: Former    Types: Cigarettes    Quit date:  04/28/2010    Years since quitting: 12.7   Smokeless tobacco: Never  Substance Use Topics   Alcohol use: No    Alcohol/week: 0.0 standard drinks of alcohol   Drug use: No       Family History  Problem Relation Age of Onset   Clotting disorder Mother    Hypertension Mother    Congestive Heart Failure Father    Hypertension Father    Stroke Sister    Rheum arthritis Sister    Congestive Heart Failure Maternal Grandmother    Colon cancer Maternal Grandfather 22   Skin cancer Maternal Aunt        Basal cell   Skin cancer Maternal Uncle        Basal cell   Skin cancer Cousin        Basal Cell     Allergies  Allergen Reactions   Tape Rash    Some tapes cause a rash, possible the paper tape.     REVIEW OF SYSTEMS (Negative unless checked)  Constitutional: '[]'$ Weight loss  '[]'$ Fever  '[]'$ Chills Cardiac: '[x]'$ Chest pain   '[]'$ Chest pressure   '[]'$ Palpitations   '[]'$ Shortness of breath when laying flat   '[]'$ Shortness of breath at rest   '[x]'$ Shortness of breath with exertion. Vascular:  '[]'$ Pain in legs with walking   '[]'$ Pain in legs at rest   '[]'$ Pain in legs when laying flat   '[]'$ Claudication   '[]'$ Pain in feet when walking  '[]'$ Pain in feet at rest  '[]'$ Pain in feet when laying flat   '[x]'$ History of DVT   '[x]'$ Phlebitis   '[x]'$ Swelling in legs   '[]'$ Varicose veins   '[]'$ Non-healing ulcers Pulmonary:   '[]'$ Uses home oxygen   '[]'$ Productive cough   '[]'$ Hemoptysis   '[]'$ Wheeze  '[]'$ COPD   '[]'$ Asthma Neurologic:  '[]'$ Dizziness  '[]'$ Blackouts   '[]'$ Seizures   '[]'$ History of stroke   '[]'$ History of TIA  '[]'$ Aphasia   '[]'$ Temporary blindness   '[]'$ Dysphagia   '[]'$ Weakness or numbness in arms   '[]'$ Weakness or numbness in legs Musculoskeletal:  '[x]'$ Arthritis   '[]'$ Joint swelling   '[x]'$ Joint pain   '[]'$ Low back pain Hematologic:  '[]'$ Easy bruising  '[]'$ Easy bleeding   '[x]'$ Hypercoagulable state   '[]'$ Anemic   Gastrointestinal:  '[]'$ Blood in stool   '[]'$ Vomiting blood  '[]'$ Gastroesophageal reflux/heartburn   '[]'$ Abdominal pain Genitourinary:  '[]'$ Chronic kidney disease    '[]'$ Difficult urination  '[]'$ Frequent urination  '[]'$ Burning with urination   '[]'$ Hematuria Skin:  '[]'$ Rashes   '[]'$ Ulcers   '[]'$ Wounds Psychological:  '[]'$ History of anxiety   '[]'$  History of major depression.  Physical Examination  BP 124/83 (BP Location: Left Arm)   Pulse 76   Resp 18   Ht 5' 7.5" (1.715 m)   Wt 271 lb 8  oz (123.2 kg)   BMI 41.90 kg/m  Gen:  WD/WN, NAD.  Obese Head: [place]/AT, No temporalis wasting. Ear/Nose/Throat: Hearing grossly intact, nares w/o erythema or drainage Eyes: Conjunctiva clear. Sclera non-icteric Neck: Supple.  Trachea midline Pulmonary:  Good air movement, no use of accessory muscles.  Cardiac: RRR, no JVD Vascular:  Vessel Right Left  Radial Palpable Palpable                   Musculoskeletal: M/S 5/5 throughout.  No deformity or atrophy.  Mild lower extremity edema. Neurologic: Sensation grossly intact in extremities.  Symmetrical.  Speech is fluent.  Psychiatric: Judgment intact, Mood & affect appropriate for pt's clinical situation. Dermatologic: No rashes or ulcers noted.  No cellulitis or open wounds.      Labs Recent Results (from the past 2160 hour(s))  TSH     Status: Abnormal   Collection Time: 11/29/22  3:22 PM  Result Value Ref Range   TSH 0.227 (L) 0.350 - 4.500 uIU/mL    Comment: Performed by a 3rd Generation assay with a functional sensitivity of <=0.01 uIU/mL. Performed at [facility], [address] [place], [place] [address]   T4, free     Status: Abnormal   Collection Time: 11/29/22  3:22 PM  Result Value Ref Range   Free T4 1.64 (H) 0.61 - 1.12 ng/dL    Comment: (NOTE) Biotin ingestion may interfere with free T4 tests. If the results are inconsistent with the TSH level, previous test results, or the clinical presentation, then consider biotin interference. If needed, order repeat testing after stopping biotin. Performed at [facility], [address] [place], [place] [address]   T3     Status: None    Collection Time: 11/29/22  3:22 PM  Result Value Ref Range   T3, Total 85 71 - 180 ng/dL    Comment: (NOTE) Performed At: [facility] [place], [place] [id number] Rush Farmer MD [id number]   CBC with Differential     Status: Abnormal   Collection Time: 01/22/23  4:25 PM  Result Value Ref Range   WBC 8.6 4.0 - 10.5 K/uL   RBC 5.11 3.87 - 5.11 MIL/uL   Hemoglobin 15.9 (H) 12.0 - 15.0 g/dL   HCT 48.6 (H) 36.0 - 46.0 %   MCV 95.1 80.0 - 100.0 fL   MCH 31.1 26.0 - 34.0 pg   MCHC 32.7 30.0 - 36.0 g/dL   RDW 14.6 11.5 - 15.5 %   Platelets 238 150 - 400 K/uL   nRBC 0.0 0.0 - 0.2 %   Neutrophils Relative % 75 %   Neutro Abs 6.3 1.7 - 7.7 K/uL   Lymphocytes Relative 18 %   Lymphs Abs 1.6 0.7 - 4.0 K/uL   Monocytes Relative 6 %   Monocytes Absolute 0.5 0.1 - 1.0 K/uL   Eosinophils Relative 1 %   Eosinophils Absolute 0.1 0.0 - 0.5 K/uL   Basophils Relative 0 %   Basophils Absolute 0.0 0.0 - 0.1 K/uL   Immature Granulocytes 0 %   Abs Immature Granulocytes 0.03 0.00 - 0.07 K/uL    Comment: Performed at [facility], [address], [place], [place] [id number]  Basic metabolic panel     Status: Abnormal   Collection Time: 01/22/23  4:25 PM  Result Value Ref Range   Sodium 135 135 - 145 mmol/L   Potassium 6.1 (H) 3.5 - 5.1 mmol/L    Comment: HEMOLYSIS  AT THIS LEVEL MAY AFFECT RESULT   Chloride 106 98 - 111 mmol/L   CO2 19 (L) 22 - 32 mmol/L   Glucose, Bld 102 (H) 70 - 99 mg/dL    Comment: Glucose reference range applies only to samples taken after fasting for at least 8 hours.   BUN 11 6 - 20 mg/dL   Creatinine, Ser 0.70 0.44 - 1.00 mg/dL   Calcium 9.0 8.9 - 10.3 mg/dL   GFR, Estimated >60 >60 mL/min    Comment: (NOTE) Calculated using the CKD-EPI Creatinine Equation (2021)    Anion gap 10 5 - 15    Comment: Performed at [facility], [address] [place], [place] [address]  Brain natriuretic peptide     Status: None    Collection Time: 01/22/23  4:25 PM  Result Value Ref Range   B Natriuretic Peptide 18.5 0.0 - 100.0 pg/mL    Comment: Performed at [facility], [address] [place], [place] [address]  Resp panel by RT-PCR (RSV, Flu A&B, Covid) Anterior Nasal Swab     Status: None   Collection Time: 01/22/23  4:46 PM   Specimen: Anterior Nasal Swab  Result Value Ref Range   SARS Coronavirus 2 by RT PCR NEGATIVE NEGATIVE    Comment: (NOTE) SARS-CoV-2 target nucleic acids are NOT DETECTED.  The SARS-CoV-2 RNA is generally detectable in upper respiratory specimens during the acute phase of infection. The lowest concentration of SARS-CoV-2 viral copies this assay can detect is 138 copies/mL. A negative result does not preclude SARS-Cov-2 infection and should not be used as the sole basis for treatment or other patient management decisions. A negative result may occur with  improper specimen collection/handling, submission of specimen other than nasopharyngeal swab, presence of viral mutation(s) within the areas targeted by this assay, and inadequate number of viral copies(<138 copies/mL). A negative result must be combined with clinical observations, patient history, and epidemiological information. The expected result is Negative.  Fact Sheet for Patients:  EntrepreneurPulse.com.au  Fact Sheet for Healthcare Providers:  IncredibleEmployment.be  This test is no t yet approved or cleared by the [place] FDA and  has been authorized for detection and/or diagnosis of SARS-CoV-2 by FDA under an Emergency Use Authorization (EUA). This EUA will remain  in effect (meaning this test can be used) for the duration of the COVID-19 declaration under Section 564(b)(1) of the Act, 21 U.S.C.section 360bbb-3(b)(1), unless the authorization is terminated  or revoked sooner.       Influenza A by PCR NEGATIVE NEGATIVE   Influenza B by PCR NEGATIVE NEGATIVE     Comment: (NOTE) The Xpert Xpress SARS-CoV-2/FLU/RSV plus assay is intended as an aid in the diagnosis of influenza from Nasopharyngeal swab specimens and should not be used as a sole basis for treatment. Nasal washings and aspirates are unacceptable for Xpert Xpress SARS-CoV-2/FLU/RSV testing.  Fact Sheet for Patients: EntrepreneurPulse.com.au  Fact Sheet for Healthcare Providers: IncredibleEmployment.be  This test is not yet approved or cleared by the [place] FDA and has been authorized for detection and/or diagnosis of SARS-CoV-2 by FDA under an Emergency Use Authorization (EUA). This EUA will remain in effect (meaning this test can be used) for the duration of the COVID-19 declaration under Section 564(b)(1) of the Act, 21 U.S.C. section 360bbb-3(b)(1), unless the authorization is terminated or revoked.     Resp Syncytial Virus by PCR NEGATIVE NEGATIVE    Comment: (NOTE) Fact Sheet for Patients: EntrepreneurPulse.com.au  Fact Sheet for  Healthcare Providers: IncredibleEmployment.be  This test is not yet approved or cleared by the [place] and has been authorized for detection and/or diagnosis of SARS-CoV-2 by FDA under an Emergency Use Authorization (EUA). This EUA will remain in effect (meaning this test can be used) for the duration of the COVID-19 declaration under Section 564(b)(1) of the Act, 21 U.S.C. section 360bbb-3(b)(1), unless the authorization is terminated or revoked.  Performed at [facility], [address] [place], [place] [address]   Potassium     Status: None   Collection Time: 01/22/23  7:15 PM  Result Value Ref Range   Potassium 3.7 3.5 - 5.1 mmol/L    Comment: Performed at [facility], [place], [place] [address]  Troponin I (High Sensitivity)     Status: None   Collection Time: 01/22/23  7:15 PM  Result Value Ref Range    Troponin I (High Sensitivity) 3 <18 ng/L    Comment: (NOTE) Elevated high sensitivity troponin I (hsTnI) values and significant  changes across serial measurements may suggest ACS but many other  chronic and acute conditions are known to elevate hsTnI results.  Refer to the "Links" section for chest pain algorithms and additional  guidance. Performed at [facility], [address] [place], [place] [address]   Protime-INR     Status: None   Collection Time: 01/22/23  8:29 PM  Result Value Ref Range   Prothrombin Time 14.8 11.4 - 15.2 seconds   INR 1.2 0.8 - 1.2    Comment: (NOTE) INR goal varies based on device and disease states. Performed at [facility], [place], [place] [address]   Troponin I (High Sensitivity)     Status: None   Collection Time: 01/22/23 10:29 PM  Result Value Ref Range   Troponin I (High Sensitivity) 4 <18 ng/L    Comment: (NOTE) Elevated high sensitivity troponin I (hsTnI) values and significant  changes across serial measurements may suggest ACS but many other  chronic and acute conditions are known to elevate hsTnI results.  Refer to the "Links" section for chest pain algorithms and additional  guidance. Performed at [facility], [address] [place], [place] [address]   CBC     Status: None   Collection Time: 01/23/23  2:01 AM  Result Value Ref Range   WBC 7.9 4.0 - 10.5 K/uL   RBC 4.40 3.87 - 5.11 MIL/uL   Hemoglobin 13.8 12.0 - 15.0 g/dL   HCT 41.3 36.0 - 46.0 %   MCV 93.9 80.0 - 100.0 fL   MCH 31.4 26.0 - 34.0 pg   MCHC 33.4 30.0 - 36.0 g/dL   RDW 14.5 11.5 - 15.5 %   Platelets 218 150 - 400 K/uL   nRBC 0.0 0.0 - 0.2 %    Comment: Performed at [facility], [place], [place] [address]  Heparin level (unfractionated)     Status: None   Collection Time: 01/23/23  2:01 AM  Result Value Ref Range   Heparin Unfractionated 0.59 0.30 - 0.70 IU/mL    Comment:  (NOTE) The clinical reportable range upper limit is being lowered to >1.10 to align with the FDA approved guidance for the current laboratory assay.  If heparin results are below expected values, and patient dosage has  been confirmed, suggest follow up testing of antithrombin III levels. Performed at [facility], [address], [place], [place] [address]  HIV Antibody (routine testing w rflx)     Status: None   Collection Time: 01/23/23  2:01 AM  Result Value Ref Range   HIV Screen 4th Generation wRfx Non Reactive Non Reactive    Comment: Performed at [facility], [place] [address], [place], [place] [address]  Protime-INR     Status: None   Collection Time: 01/23/23  2:01 AM  Result Value Ref Range   Prothrombin Time 15.1 11.4 - 15.2 seconds   INR 1.2 0.8 - 1.2    Comment: (NOTE) INR goal varies based on device and disease states. Performed at [facility], [address] [place], [place] [address]   T4, free     Status: Abnormal   Collection Time: 01/23/23  2:01 AM  Result Value Ref Range   Free T4 1.42 (H) 0.61 - 1.12 ng/dL    Comment: (NOTE) Biotin ingestion may interfere with free T4 tests. If the results are inconsistent with the TSH level, previous test results, or the clinical presentation, then consider biotin interference. If needed, order repeat testing after stopping biotin. Performed at [facility], [address] [place], [place] [address]   TSH     Status: None   Collection Time: 01/23/23  2:01 AM  Result Value Ref Range   TSH 0.593 0.350 - 4.500 uIU/mL    Comment: Performed by a 3rd Generation assay with a functional sensitivity of <=0.01 uIU/mL. Performed at [facility], [address] [place], [place] [address]   Ferritin     Status: None   Collection Time: 02/02/23 12:34 PM  Result Value Ref Range   Ferritin 135 11 - 307 ng/mL    Comment: Performed at [facility], [address]  [place], [place] [address]  VITAMIN D 25 Hydroxy (Vit-D Deficiency, Fractures)     Status: None   Collection Time: 02/02/23 12:34 PM  Result Value Ref Range   Vit D, 25-Hydroxy 73.89 30 - 100 ng/mL    Comment: (NOTE) Vitamin D deficiency has been defined by the Institute of Medicine  and an Endocrine Society practice guideline as a level of serum 25-OH  vitamin D less than 20 ng/mL (1,2). The Endocrine Society went on to  further define vitamin D insufficiency as a level between 21 and 29  ng/mL (2).  1. IOM (Institute of Medicine). 2010. Dietary reference intakes for  calcium and D. [place]: The [employer]. 2. Holick MF, Binkley [place], Bischoff-Ferrari HA, et al. Evaluation,  treatment, and prevention of vitamin D deficiency: an Endocrine  Society clinical practice guideline, JCEM. 2011 Jul; 96(7): 1911-30.  Performed at [facility], [place] [address] [place], [place] [address]   CBC with Differential/Platelet     Status: Abnormal   Collection Time: 02/02/23 12:34 PM  Result Value Ref Range   WBC 8.2 4.0 - 10.5 K/uL   RBC 5.11 3.87 - 5.11 MIL/uL   Hemoglobin 15.5 (H) 12.0 - 15.0 g/dL   HCT 47.7 (H) 36.0 - 46.0 %   MCV 93.3 80.0 - 100.0 fL   MCH 30.3 26.0 - 34.0 pg   MCHC 32.5 30.0 - 36.0 g/dL   RDW 14.1 11.5 - 15.5 %   Platelets 331 150 - 400 K/uL   nRBC 0.0 0.0 - 0.2 %   Neutrophils Relative % 48 %   Neutro Abs 3.9 1.7 - 7.7 K/uL   Lymphocytes Relative 42 %   Lymphs Abs 3.5 0.7 -  4.0 K/uL   Monocytes Relative 7 %   Monocytes Absolute 0.5 0.1 - 1.0 K/uL   Eosinophils Relative 3 %   Eosinophils Absolute 0.2 0.0 - 0.5 K/uL   Basophils Relative 0 %   Basophils Absolute 0.0 0.0 - 0.1 K/uL   Immature Granulocytes 0 %   Abs Immature Granulocytes 0.02 0.00 - 0.07 K/uL    Comment: Performed at [facility], [address] [place], [place] [address]  Folate     Status: None   Collection Time: 02/02/23 12:34 PM  Result Value Ref Range   Folate 35.0  >5.9 ng/mL    Comment: Performed at [facility], [address] [place], [place] [address]  Comprehensive metabolic panel     Status: Abnormal   Collection Time: 02/02/23 12:34 PM  Result Value Ref Range   Sodium 137 135 - 145 mmol/L   Potassium 3.8 3.5 - 5.1 mmol/L   Chloride 104 98 - 111 mmol/L   CO2 26 22 - 32 mmol/L   Glucose, Bld 100 (H) 70 - 99 mg/dL    Comment: Glucose reference range applies only to samples taken after fasting for at least 8 hours.   BUN 9 6 - 20 mg/dL   Creatinine, Ser 0.67 0.44 - 1.00 mg/dL   Calcium 9.3 8.9 - 10.3 mg/dL   Total Protein 7.6 6.5 - 8.1 g/dL   Albumin 3.8 3.5 - 5.0 g/dL   AST 19 15 - 41 U/L   ALT 19 0 - 44 U/L   Alkaline Phosphatase 73 38 - 126 U/L   Total Bilirubin 0.8 0.3 - 1.2 mg/dL   GFR, Estimated >60 >60 mL/min    Comment: (NOTE) Calculated using the CKD-EPI Creatinine Equation (2021)    Anion gap 7 5 - 15    Comment: Performed at [facility], [address] [place], [place] [address]  Magnesium     Status: None   Collection Time: 02/02/23 12:34 PM  Result Value Ref Range   Magnesium 1.9 1.7 - 2.4 mg/dL    Comment: Performed at [facility], [address] [place], [place] [address]  TSH     Status: None   Collection Time: 02/02/23 12:34 PM  Result Value Ref Range   TSH 3.538 0.350 - 4.500 uIU/mL    Comment: Performed by a 3rd Generation assay with a functional sensitivity of <=0.01 uIU/mL. Performed at [facility], [address] [place], [place] [address]   T4, free     Status: Abnormal   Collection Time: 02/02/23 12:34 PM  Result Value Ref Range   Free T4 1.43 (H) 0.61 - 1.12 ng/dL    Comment: (NOTE) Biotin ingestion may interfere with free T4 tests. If the results are inconsistent with the TSH level, previous test results, or the clinical presentation, then consider biotin interference. If needed, order repeat testing after stopping biotin. Performed at [facility], [address] [place], [place] [address]   T3, free     Status: None   Collection Time: 02/02/23 12:34 PM  Result Value Ref Range   T3, Free 2.5 2.0 - 4.4 pg/mL    Comment: (NOTE) Performed At: [facility] [address] [place], [place] [id number] Rush Farmer MD [id number]   Iron and TIBC     Status: None   Collection Time: 02/02/23 12:34 PM  Result Value Ref Range   Iron 93 28 - 170 ug/dL   TIBC 361 250 -  450 ug/dL   Saturation Ratios 26 10.4 - 31.8 %   UIBC 268 ug/dL    Comment: Performed at [facility], [address] [place], [place] [address]  Vitamin B12     Status: None   Collection Time: 02/02/23 12:35 PM  Result Value Ref Range   Vitamin B-12 359 180 - 914 pg/mL    Comment: (NOTE) This assay is not validated for testing neonatal or myeloproliferative syndrome specimens for Vitamin B12 levels. Performed at [facility], [place] [address], [place], [place] [address]   Amylase     Status: Abnormal   Collection Time: 02/02/23 12:35 PM  Result Value Ref Range   Amylase 823 (H) 28 - 100 U/L    Comment: Performed at [facility], [address], [place], [place] [address]    Radiology US Venous Img Lower Unilateral Left (DVT)  Result Date: 01/22/2023 CLINICAL DATA:  Pain EXAM: LEFT LOWER EXTREMITY VENOUS DOPPLER ULTRASOUND TECHNIQUE: Gray-scale sonography with compression, as well as color and duplex ultrasound, were performed to evaluate the deep venous system(s) from the level of the common femoral vein through the popliteal and proximal calf veins. COMPARISON:  None Available. FINDINGS: VENOUS Normal compressibility of the common femoral, superficial femoral, and popliteal veins, as well as the visualized calf veins. Visualized portions of profunda femoral vein and great saphenous vein unremarkable. No filling defects to suggest DVT on grayscale or color Doppler imaging. Doppler waveforms show normal direction of venous  flow, normal respiratory plasticity and response to augmentation. Limited views of the contralateral common femoral vein are unremarkable. OTHER None. Limitations: none IMPRESSION: Negative. Electronically Signed   By: Ronney Asters M.D.   On: 01/22/2023 22:18   CT Angio Chest PE W and/or Wo Contrast  Result Date: 01/22/2023 CLINICAL DATA:  Left leg pain beginning 2 days ago. History of blood clots. EXAM: CT ANGIOGRAPHY CHEST WITH CONTRAST TECHNIQUE: Multidetector CT imaging of the chest was performed using the standard protocol during bolus administration of intravenous contrast. Multiplanar CT image reconstructions and MIPs were obtained to evaluate the vascular anatomy. RADIATION DOSE REDUCTION: This exam was performed according to the departmental dose-optimization program which includes automated exposure control, adjustment of the mA and/or kV according to patient size and/or use of iterative reconstruction technique. CONTRAST:  163m OMNIPAQUE IOHEXOL 350 MG/ML SOLN COMPARISON:  CT a chest, 07/03/2021. FINDINGS: Cardiovascular: Bilateral pulmonary emboli. Emboli are most evident on the left. Nonocclusive pulmonary emboli extend from the left lower lobe pulmonary artery into segmental branches including a lingular segmental branch. On the right, nonocclusive pulmonary embolus noted in the right upper lobe pulmonary artery at the apical segmental branch point. Heart is normal in size and configuration. No pericardial effusion. Great vessels are normal in caliber. No aortic dissection. Minimal aortic atherosclerosis. Arch branch vessels are widely patent. Mediastinum/Nodes: No neck base, mediastinal or hilar masses or enlarged lymph nodes. Trachea and esophagus are unremarkable. Lungs/Pleura: Lungs demonstrate mosaic type perfusion similar to the prior chest CTA, suspected to be due to small airways disease/air trapping. No convincing pneumonia and no pulmonary edema. No pleural effusion or pneumothorax. No  evidence of an infarct. Upper Abdomen: Previous gastric surgery.  No acute findings. Musculoskeletal: No fracture or acute finding. No bone lesion. No chest wall mass. Review of the MIP images confirms the above findings. IMPRESSION: 1. Bilateral nonocclusive pulmonary emboli, most evident on the left, where emboli extend from the lower lobe pulmonary artery into segmental branches. Small  nonocclusive right upper lobe pulmonary embolism. 2. No other evidence of an acute abnormality. No evidence of pneumonia or pulmonary edema. 3. Minimal aortic atherosclerosis. Aortic Atherosclerosis (ICD10-I70.0). Electronically Signed   By: Lajean Manes M.D.   On: 01/22/2023 19:51   US Venous Img Lower Unilateral Right  Result Date: 01/22/2023 CLINICAL DATA:  58 year old female with RIGHT leg pain. EXAM: RIGHT LOWER EXTREMITY VENOUS DOPPLER ULTRASOUND TECHNIQUE: Gray-scale sonography with compression, as well as color and duplex ultrasound, were performed to evaluate the deep venous system(s) from the level of the common femoral vein through the popliteal and proximal calf veins. COMPARISON:  None Available. FINDINGS: VENOUS Occlusive DVT is identified within the distal femoral vein, the popliteal vein and the proximal posterior tibial vein. Other calf veins are not well visualized. There is no evidence of DVT within the RIGHT common femoral or per of find of femoral veins. Limited views of the LEFT common femoral vein are unremarkable. OTHER None. Limitations: none IMPRESSION: Occlusive DVT within the RIGHT distal femoral, popliteal and proximal posterior tibial veins. Critical Value/emergent results were called by telephone at the time of interpretation on 01/22/2023 at 4:25 pm to provider Merlyn Lot , who verbally acknowledged these results. Electronically Signed   By: Margarette Canada M.D.   On: 01/22/2023 16:25    Assessment/Plan  Leg DVT (deep venous thromboembolism), acute, right ([place]) she was found to have right  lower extremity DVT in the tibial, popliteal, and distal femoral vein.  She also had a CT scan of her chest which I have independently reviewed which demonstrated relatively small volume pulmonary embolus without right heart strain.  This was in the left lower lobe and a small amount in the right upper lobe. She was appropriately started on anticoagulation and tolerating this well.  For that level of DVT, no thrombectomy is indicated.  Elevation, exercise, and compression socks are recommended.  Given her multiple unprovoked embolic phenomenon, I would recommend lifelong anticoagulation.  Pulmonary embolism ([place]) She also had a CT scan of her chest which I have independently reviewed which demonstrated relatively small volume pulmonary embolus without right heart strain.  This was in the left lower lobe and a small amount in the right upper lobe.  This does not require thrombectomy.  She is being treated appropriately with anticoagulation.  Given her multiple previous embolic phenomenon, lifelong anticoagulation would be recommended.  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  At this point with another thromboembolic event, lifelong anticoagulation is recommended.    Leotis Pain, MD  02/04/2023 12:16 PM    This note was created with Dragon medical transcription system.  Any errors from dictation are purely unintentional

## 2023-02-04 NOTE — Assessment & Plan Note (Signed)
She also had a CT scan of her chest which I have independently reviewed which demonstrated relatively small volume pulmonary embolus without right heart strain.  This was in the left lower lobe and a small amount in the right upper lobe.  This does not require thrombectomy.  She is being treated appropriately with anticoagulation.  Given her multiple previous embolic phenomenon, lifelong anticoagulation would be recommended.

## 2023-02-07 LAB — VITAMIN B1: Vitamin B1 (Thiamine): 101.1 nmol/L (ref 66.5–200.0)

## 2023-02-08 ENCOUNTER — Encounter: Payer: Self-pay | Admitting: Internal Medicine

## 2023-02-08 ENCOUNTER — Telehealth: Payer: 59 | Admitting: Internal Medicine

## 2023-02-08 VITALS — Ht 67.5 in | Wt 271.5 lb

## 2023-02-08 DIAGNOSIS — Z09 Encounter for follow-up examination after completed treatment for conditions other than malignant neoplasm: Secondary | ICD-10-CM

## 2023-02-08 DIAGNOSIS — E038 Other specified hypothyroidism: Secondary | ICD-10-CM

## 2023-02-08 DIAGNOSIS — I2694 Multiple subsegmental pulmonary emboli without acute cor pulmonale: Secondary | ICD-10-CM | POA: Diagnosis not present

## 2023-02-08 DIAGNOSIS — I82401 Acute embolism and thrombosis of unspecified deep veins of right lower extremity: Secondary | ICD-10-CM | POA: Diagnosis not present

## 2023-02-08 LAB — ZINC: Zinc: 76 ug/dL (ref 44–115)

## 2023-02-08 NOTE — Progress Notes (Signed)
[facility] [place],  [address]  Internal MEDICINE  Telephone Visit  Patient Name: [name]  [id number]  [id number]  Date of Service: 03/01/2023  I connected with the patient at 936 am by telephone and verified the patients identity using two identifiers.   I discussed the limitations, risks, security and privacy concerns of performing an evaluation and management service by telephone and the availability of in person appointments. I also discussed with the patient that there may be a patient responsible charge related to the service.  The patient expressed understanding and agrees to proceed.    Chief Complaint  Patient presents with   Telephone Assessment    [id number]   Telephone Screen    Follow up    Hypertension    HPI Pt is connected due to mobility disability as tele-health follow up SONAM WANDEL is a 57 y.o. female with medical history significant of hypertension, obesity s/p gastric sleeve, hypothyroidism, OSA on CPAP, ulcerative colitis s/p total colectomy, Raynaud's syndrome, Buerger's disease, who presents to the ED with complaints of leg pain.  Duplex ultrasound showed a right leg DVT in  RIGHT distal femoral, popliteal and proximal posterior tibial veins.  CT angiogram showed bilateral PE, no central PE.  Pt was started on IV heparin and later switched to Eliquis  Pt is on Synthroid 137 mcg po qd,     Current Medication: Outpatient Encounter Medications as of 02/08/2023  Medication Sig   Cholecalciferol 50 MCG (2000 UT) CAPS Take by mouth.   Cranberry 1000 MG CAPS Take 1 tablet by mouth 2 (two) times daily.   cyanocobalamin (VITAMIN B12) 1000 MCG tablet Take 1,000 mcg by mouth daily.   famotidine (PEPCID) 20 MG tablet TAKE ONE TABLET BY MOUTH TWICE A DAY (Patient taking differently: Take 20 mg by mouth daily as needed.)   folic acid (FOLVITE) 1 MG tablet TAKE 1 TABLET BY MOUTH EVERY DAY   ivermectin (STROMECTOL) 3 MG TABS  tablet Take 24 mg by mouth daily.   levothyroxine (SYNTHROID) 137 MCG tablet Take 137 mcg by mouth daily before breakfast.   MAGNESIUM CHLORIDE PO Take 143 mg by mouth. Two tablets daily   NON FORMULARY Take 1 tablet by mouth daily. [employer] Mens daily vitamin. NO IRON   Omega-3 Fatty Acids (FISH OIL) 1360 MG CAPS Take 1,400 mg by mouth daily.   potassium chloride (MICRO-K) 10 MEQ CR capsule Take 10 mEq by mouth daily. Takes two on weds   Probiotic Product (PROBIOTIC ADVANCED PO) Take 1 capsule by mouth daily.   [DISCONTINUED] APIXABAN (ELIQUIS) VTE STARTER PACK (10MG  AND 5MG ) Take as directed on package: start with two-5mg  tablets twice daily for 7 days. On day 8, switch to one-5mg  tablet twice daily.   No facility-administered encounter medications on file as of 02/08/2023.    Surgical History: Past Surgical History:  Procedure Laterality Date   ADENOIDECTOMY     Small child   APPENDECTOMY     Age 59   COLON SURGERY  1980   ileostomy and internal pouch age 48   CORONARY/GRAFT ACUTE MI REVASCULARIZATION N/A 06/12/2021   Procedure: Coronary/Graft Acute MI Revascularization;  Surgeon: Isaias Cowman, MD;  Location: [place] [facility];  Service: Cardiovascular;  Laterality: N/A;   DILATATION & CURETTAGE/HYSTEROSCOPY WITH MYOSURE N/A 05/02/2015   Procedure: DILATATION & CURETTAGE/HYSTEROSCOPY WITH MYOSURE;  Surgeon: Malachy Mood, MD;  Location: [facility];  Service: Gynecology;  Laterality: N/A;   LAPAROSCOPIC GASTRIC SLEEVE  RESECTION  07/06/2016   LEFT HEART CATH AND CORONARY ANGIOGRAPHY N/A 06/12/2021   Procedure: LEFT HEART CATH AND CORONARY ANGIOGRAPHY;  Surgeon: Isaias Cowman, MD;  Location: [place] [facility];  Service: Cardiovascular;  Laterality: N/A;    Medical History: Past Medical History:  Diagnosis Date   Berger's disease 2011   Buerger's disease ([place])    Family history of adverse reaction to anesthesia    sister and daughter difficult to wake up   Folic  acid deficiency    GERD (gastroesophageal reflux disease)    Hypertension    Hypothyroidism    IDA (iron deficiency anemia)    Osteoarthritis    PVC's (premature ventricular contractions)    Thrombocytosis     Family History: Family History  Problem Relation Age of Onset   Clotting disorder Mother    Hypertension Mother    Congestive Heart Failure Father    Hypertension Father    Stroke Sister    Rheum arthritis Sister    Congestive Heart Failure Maternal Grandmother    Colon cancer Maternal Grandfather 85   Skin cancer Maternal Aunt        Basal cell   Skin cancer Maternal Uncle        Basal cell   Skin cancer Cousin        Basal Cell    Social History   Socioeconomic History   Marital status: Married    Spouse name: Not on file   Number of children: Not on file   Years of education: Not on file   Highest education level: Not on file  Occupational History   Not on file  Tobacco Use   Smoking status: Former    Types: Cigarettes    Quit date: 04/28/2010    Years since quitting: 12.8   Smokeless tobacco: Never  Substance and Sexual Activity   Alcohol use: No    Alcohol/week: 0.0 standard drinks of alcohol   Drug use: No   Sexual activity: Yes    Birth control/protection: None, I.U.D.  Other Topics Concern   Not on file  Social History Narrative   Not on file   Social Determinants of Health   Financial Resource Strain: Not on file  Food Insecurity: Not on file  Transportation Needs: Not on file  Physical Activity: Not on file  Stress: Not on file  Social Connections: Not on file  Intimate Partner Violence: Not on file      Review of Systems  Constitutional: Negative.  Negative for fatigue and fever.  HENT:  Negative for congestion, mouth sores and postnasal drip.   Respiratory:  Negative for cough.   Cardiovascular:  Negative for chest pain.  Genitourinary:  Negative for flank pain.  Psychiatric/Behavioral: Negative.      Vital Signs: Ht 5'  7.5" (1.715 m)   Wt 271 lb 8 oz (123.2 kg)   BMI 41.90 kg/m    Observation/Objective: Patient looks comfortable    Assessment/Plan: 1. Leg DVT (deep venous thromboembolism), acute, right ([place]) This is improved patient is on Eliquis  2. Multiple subsegmental pulmonary emboli without acute cor pulmonale (HCC) Patient will continue Eliquis until further evaluation  3. Other specified hypothyroidism Patient is on Synthroid we will adjust her dosing after the labs are done  4. Hospital discharge follow-up All hospital records her reviewed and evaluated  General Counseling: Sandy Salaam understanding of the findings of today's phone visit and agrees with plan of treatment. I have discussed any further diagnostic  evaluation that may be needed or ordered today. We also reviewed her medications today. she has been encouraged to call the office with any questions or concerns that should arise related to todays visit.    Time spent:45 Minutes    Dr Lavera Guise Internal medicine

## 2023-02-09 ENCOUNTER — Other Ambulatory Visit
Admission: RE | Admit: 2023-02-09 | Discharge: 2023-02-09 | Disposition: A | Discharge status: Home / Self Care | Payer: 59 | Attending: Student in an Organized Health Care Education/Training Program | Admitting: Student in an Organized Health Care Education/Training Program

## 2023-02-09 ENCOUNTER — Other Ambulatory Visit
Admission: RE | Admit: 2023-02-09 | Discharge: 2023-02-09 | Disposition: A | Discharge status: Home / Self Care | Payer: 59 | Attending: Internal Medicine | Admitting: Internal Medicine

## 2023-02-09 DIAGNOSIS — M359 Systemic involvement of connective tissue, unspecified: Secondary | ICD-10-CM | POA: Diagnosis present

## 2023-02-09 LAB — COMPREHENSIVE METABOLIC PANEL
ALT: 18 U/L (ref 0–44)
AST: 22 U/L (ref 15–41)
Albumin: 3.9 g/dL (ref 3.5–5.0)
Alkaline Phosphatase: 67 U/L (ref 38–126)
Anion gap: 9 (ref 5–15)
BUN: 12 mg/dL (ref 6–20)
CO2: 25 mmol/L (ref 22–32)
Calcium: 9.2 mg/dL (ref 8.9–10.3)
Chloride: 103 mmol/L (ref 98–111)
Creatinine, Ser: 0.66 mg/dL (ref 0.44–1.00)
GFR, Estimated: >60 mL/min (ref >60)
Glucose, Bld: 92 mg/dL (ref 70–99)
Potassium: 3.8 mmol/L (ref 3.5–5.1)
Sodium: 137 mmol/L (ref 135–145)
Total Bilirubin: 1 mg/dL (ref 0.3–1.2)
Total Protein: 7.4 g/dL (ref 6.5–8.1)

## 2023-02-09 LAB — CBC WITH DIFFERENTIAL/PLATELET
Abs Immature Granulocytes: 0.02 10*3/uL (ref 0.00–0.07)
Basophils Absolute: 0 10*3/uL (ref 0.0–0.1)
Basophils Relative: 1 %
Eosinophils Absolute: 0.2 10*3/uL (ref 0.0–0.5)
Eosinophils Relative: 2 %
HCT: 44.4 % (ref 36.0–46.0)
Hemoglobin: 14.5 g/dL (ref 12.0–15.0)
Immature Granulocytes: 0 %
Lymphocytes Relative: 44 %
Lymphs Abs: 3.4 10*3/uL (ref 0.7–4.0)
MCH: 30.7 pg (ref 26.0–34.0)
MCHC: 32.7 g/dL (ref 30.0–36.0)
MCV: 93.9 fL (ref 80.0–100.0)
Monocytes Absolute: 0.6 10*3/uL (ref 0.1–1.0)
Monocytes Relative: 8 %
Neutro Abs: 3.5 10*3/uL (ref 1.7–7.7)
Neutrophils Relative %: 45 %
Platelets: 254 10*3/uL (ref 150–400)
RBC: 4.73 MIL/uL (ref 3.87–5.11)
RDW: 14.4 % (ref 11.5–15.5)
WBC: 7.8 10*3/uL (ref 4.0–10.5)
nRBC: 0 % (ref 0.0–0.2)

## 2023-02-09 LAB — AMYLASE: Amylase: 944 U/L — ABNORMAL HIGH (ref 28–100)

## 2023-02-11 ENCOUNTER — Other Ambulatory Visit
Admission: RE | Admit: 2023-02-11 | Discharge: 2023-02-11 | Disposition: A | Discharge status: Home / Self Care | Payer: 59 | Source: Ambulatory Visit | Attending: Student in an Organized Health Care Education/Training Program | Admitting: Student in an Organized Health Care Education/Training Program

## 2023-02-11 ENCOUNTER — Other Ambulatory Visit: Payer: Self-pay

## 2023-02-11 DIAGNOSIS — Z008 Encounter for other general examination: Secondary | ICD-10-CM | POA: Insufficient documentation

## 2023-02-11 DIAGNOSIS — E7849 Other hyperlipidemia: Secondary | ICD-10-CM | POA: Diagnosis not present

## 2023-02-11 DIAGNOSIS — E669 Obesity, unspecified: Secondary | ICD-10-CM | POA: Diagnosis not present

## 2023-02-11 MED ORDER — APIXABAN 5 MG PO TABS: 5.0000 mg | ORAL_TABLET | Freq: Two times a day (BID) | ORAL | 1 refills | Status: DC

## 2023-02-11 NOTE — Telephone Encounter (Signed)
As per DFK send Eliquis 5 mg bid 180 with 1 refills

## 2023-02-13 ENCOUNTER — Encounter (INDEPENDENT_AMBULATORY_CARE_PROVIDER_SITE_OTHER): Payer: Self-pay | Admitting: Vascular Surgery

## 2023-02-14 ENCOUNTER — Telehealth (INDEPENDENT_AMBULATORY_CARE_PROVIDER_SITE_OTHER): Payer: Self-pay | Admitting: Vascular Surgery

## 2023-02-14 ENCOUNTER — Ambulatory Visit (INDEPENDENT_AMBULATORY_CARE_PROVIDER_SITE_OTHER): Payer: 59

## 2023-02-14 ENCOUNTER — Other Ambulatory Visit (INDEPENDENT_AMBULATORY_CARE_PROVIDER_SITE_OTHER): Payer: Self-pay | Admitting: Internal Medicine

## 2023-02-14 ENCOUNTER — Encounter (INDEPENDENT_AMBULATORY_CARE_PROVIDER_SITE_OTHER): Payer: Self-pay | Admitting: Nurse Practitioner

## 2023-02-14 ENCOUNTER — Encounter: Payer: Self-pay | Admitting: Internal Medicine

## 2023-02-14 DIAGNOSIS — M79604 Pain in right leg: Secondary | ICD-10-CM

## 2023-02-14 DIAGNOSIS — I82411 Acute embolism and thrombosis of right femoral vein: Secondary | ICD-10-CM

## 2023-02-14 DIAGNOSIS — I82401 Acute embolism and thrombosis of unspecified deep veins of right lower extremity: Secondary | ICD-10-CM

## 2023-02-14 NOTE — Telephone Encounter (Signed)
The patient is very likely having some issues with postphlebitic symptoms.  This can be pain and discomfort associated after having a DVT.  We can have the patient come in for DVT study only to determine if her DVT is worse.  If not, the symptoms are likely related to postphlebitic symptoms.  If she feels that she is concerned that she has a blood clot in her lungs that she does not need to be seen in our office she should present to the emergency room as we are not able to do a CT scan in our office.

## 2023-02-14 NOTE — Telephone Encounter (Signed)
Patient schedule for today

## 2023-02-14 NOTE — Telephone Encounter (Signed)
Patient schedule today

## 2023-02-14 NOTE — Telephone Encounter (Signed)
Patient was seen 01/22/23 at ED for dvt and was last seen 02/04/23 in the office. Patient stated that over the weekend the started having right leg pain near the knee cap, behind the calf, and the inside of her left felt warm. Patient did informed that yesterday while taking a deep breathe that felt like it was in her throat. Patient informed that she was recommended to contact our today to be seen by online physician. Please Advise

## 2023-02-14 NOTE — Telephone Encounter (Signed)
Patient called and wanted to be squeezed in sometime this afternoon to take a look at my leg and find out why I'm in pain again and in more places than I was before I started on the blood thinners.  Please advise.

## 2023-02-15 LAB — NMR, LIPOPROFILE
Cholesterol, Total: 147 mg/dL (ref 100–199)
HDL Cholesterol by NMR: 57 mg/dL (ref >39)
HDL Particle Number: 37.1 umol/L (ref >=30.5)
LDL Particle Number: 952 nmol/L (ref <1000)
LDL Size: 20.9 nm (ref >20.5)
LDL-C (NIH Calc): 74 mg/dL (ref 0–99)
LP-IR Score: 46 — ABNORMAL HIGH (ref <=45)
Small LDL Particle Number: 452 nmol/L (ref <=527)
Triglycerides by NMR: 87 mg/dL (ref 0–149)

## 2023-02-28 ENCOUNTER — Telehealth (INDEPENDENT_AMBULATORY_CARE_PROVIDER_SITE_OTHER): Payer: Self-pay

## 2023-02-28 NOTE — Telephone Encounter (Signed)
She likely does not if she has been taking her Eliquis as prescribed.  The emergency department has exactly the same scanning devices that we have when it comes to looking for DVT.  The only thing that they are not able to do in the emergency room is to look for venous reflux but that is not what we would be looking for in the case of a DVT.  We could have the patient come in for DVT only scan and we could try to work the patient and if we find a new thrombus.  In the setting that we do not find a new thrombus, the patient should continue to wear medical grade compression and continue to take her Eliquis as prescribed.  If the patient feels that she needs to be seen emergently she should proceed to the emergency room and they can evaluate her for a DVT

## 2023-02-28 NOTE — Telephone Encounter (Signed)
Patient called stating she was hospitalized 30 days ago with a 2 1/2 ft blood clot in her right leg and multiple clots in her lungs. She was placed on Eliquis 5mg . Starting last night she achy pains in her left thigh which has moved down to her lower leg and is achy deep inside now. She is wondering is it a new clot. She said she told the ED didn't have the same scanning devices that we have. She was wonder did she need to come here.   Please advise

## 2023-03-05 ENCOUNTER — Emergency Department: Payer: 59

## 2023-03-05 ENCOUNTER — Other Ambulatory Visit: Payer: Self-pay

## 2023-03-05 ENCOUNTER — Emergency Department
Admission: EM | Admit: 2023-03-05 | Discharge: 2023-03-05 | Disposition: A | Discharge status: Home / Self Care | Payer: 59 | Attending: Emergency Medicine | Admitting: Emergency Medicine

## 2023-03-05 DIAGNOSIS — R002 Palpitations: Secondary | ICD-10-CM | POA: Diagnosis present

## 2023-03-05 DIAGNOSIS — R000 Tachycardia, unspecified: Secondary | ICD-10-CM | POA: Diagnosis not present

## 2023-03-05 DIAGNOSIS — Z1152 Encounter for screening for COVID-19: Secondary | ICD-10-CM | POA: Insufficient documentation

## 2023-03-05 LAB — CBC
HCT: 46.4 % — ABNORMAL HIGH (ref 36.0–46.0)
Hemoglobin: 15.4 g/dL — ABNORMAL HIGH (ref 12.0–15.0)
MCH: 31.6 pg (ref 26.0–34.0)
MCHC: 33.2 g/dL (ref 30.0–36.0)
MCV: 95.3 fL (ref 80.0–100.0)
Platelets: 265 10*3/uL (ref 150–400)
RBC: 4.87 MIL/uL (ref 3.87–5.11)
RDW: 14.6 % (ref 11.5–15.5)
WBC: 11.7 10*3/uL — ABNORMAL HIGH (ref 4.0–10.5)
nRBC: 0 % (ref 0.0–0.2)

## 2023-03-05 LAB — TROPONIN I (HIGH SENSITIVITY): Troponin I (High Sensitivity): 5 ng/L (ref <18)

## 2023-03-05 LAB — RESP PANEL BY RT-PCR (RSV, FLU A&B, COVID)  RVPGX2
Influenza A by PCR: NEGATIVE
Influenza B by PCR: NEGATIVE
Resp Syncytial Virus by PCR: NEGATIVE
SARS Coronavirus 2 by RT PCR: NEGATIVE

## 2023-03-05 LAB — BASIC METABOLIC PANEL
Anion gap: 9 (ref 5–15)
BUN: 23 mg/dL — ABNORMAL HIGH (ref 6–20)
CO2: 22 mmol/L (ref 22–32)
Calcium: 8.7 mg/dL — ABNORMAL LOW (ref 8.9–10.3)
Chloride: 107 mmol/L (ref 98–111)
Creatinine, Ser: 0.62 mg/dL (ref 0.44–1.00)
GFR, Estimated: >60 mL/min (ref >60)
Glucose, Bld: 107 mg/dL — ABNORMAL HIGH (ref 70–99)
Potassium: 3.5 mmol/L (ref 3.5–5.1)
Sodium: 138 mmol/L (ref 135–145)

## 2023-03-05 LAB — APTT: aPTT: 27 seconds (ref 24–36)

## 2023-03-05 NOTE — ED Provider Notes (Signed)
[facility] Provider Note    Event Date/Time   First MD Initiated Contact with Patient 03/05/23 1202     (approximate)   History   Tachycardia   HPI  [name] is a 57 y.o. female with a history of NSTEMI, PE, on blood thinners who reports that she had an episode of tachycardia this morning.  She attributes this to the COVID-vaccine.  She reports she has had this for 2 years intermittently and is actually currently wearing a Holter monitor.  No shortness of breath fevers or chills, does report some mild upper respiratory symptoms    Physical Exam   Triage Vital Signs: ED Triage Vitals  Enc Vitals Group     BP 03/05/23 1145 (!) 141/103     Pulse Rate 03/05/23 1145 94     Resp 03/05/23 1145 19     Temp 03/05/23 1145 98.4 F (36.9 C)     Temp Source 03/05/23 1145 Oral     SpO2 03/05/23 1145 100 %     Weight --      Height --      Head Circumference --      Peak Flow --      Pain Score 03/05/23 1144 0     Pain Loc --      Pain Edu? --      Excl. in [place]? --     Most recent vital signs: Vitals:   03/05/23 1245 03/05/23 1300  BP:  133/82  Pulse: 84 90  Resp: 16 14  Temp:    SpO2: 99% 98%     General: Awake, no distress.  CV:  Good peripheral perfusion.  Regular rate and rhythm Resp:  Normal effort.  Clear to auscultation bilaterally Abd:  No distention.  Other:     ED Results / Procedures / Treatments   Labs (all labs ordered are listed, but only abnormal results are displayed) Labs Reviewed  BASIC METABOLIC PANEL - Abnormal; Notable for the following components:      Result Value   Glucose, Bld 107 (*)    BUN 23 (*)    Calcium 8.7 (*)    All other components within normal limits  CBC - Abnormal; Notable for the following components:   WBC 11.7 (*)    Hemoglobin 15.4 (*)    HCT 46.4 (*)    All other components within normal limits  RESP PANEL BY RT-PCR (RSV, FLU A&B, COVID)  [id number]  APTT  TROPONIN I (HIGH SENSITIVITY)      EKG  ED ECG REPORT I, Lavonia Drafts, the attending physician, personally viewed and interpreted this ECG.  Date: 03/05/2023  Rhythm: normal sinus rhythm QRS Axis: normal Intervals: normal ST/T Wave abnormalities: normal Narrative Interpretation: no evidence of acute ischemia    RADIOLOGY Chest x-ray viewed interpret by me, no acute abnormality    PROCEDURES:  Critical Care performed:   Procedures   MEDICATIONS ORDERED IN ED: Medications - No data to display   IMPRESSION / MDM / [place] / ED COURSE  I reviewed the triage vital signs and the nursing notes. Patient's presentation is most consistent with acute presentation with potential threat to life or bodily function.   Patient presents with palpitations as detailed above.  Differential includes arrhythmia, sinus tachycardia, anxiety  Patient is wearing Holter monitor likely as her heart rate is normal here.  She is in sinus rhythm.  Chest x-ray is unremarkable.  Lab work reviewed  and is reassuring, she is reassured to know that her troponins are normal.  Heart rate has been normal while in the emergency department.  She feels well and has no complaints.  No indication for admission at this time, appropriate for discharge with close follow-up with her cardiologist       FINAL CLINICAL IMPRESSION(S) / ED DIAGNOSES   Final diagnoses:  Palpitations     Rx / DC Orders   ED Discharge Orders     None        Note:  This document was prepared using Dragon voice recognition software and may include unintentional dictation errors.   Lavonia Drafts, MD 03/05/23 1535

## 2023-03-05 NOTE — ED Triage Notes (Addendum)
Pt c/o intermittent rapid HR (120's) today that is lasting longer than previous occurrences of the same. Pt states this has been happening since covid vaccine 2 years ago. Pt reports recent blood clots in lungs and leg. Pt is AOX4, NAD noted. Pt denies CP, dizziness, c/o mild SHOB. Pt on eliquis.

## 2023-03-05 NOTE — ED Notes (Signed)
Patient taken to imaging. 

## 2023-04-20 ENCOUNTER — Other Ambulatory Visit: Payer: Self-pay

## 2023-04-20 ENCOUNTER — Other Ambulatory Visit: Payer: Self-pay | Admitting: Physician Assistant

## 2023-04-20 MED ORDER — POTASSIUM CHLORIDE CRYS ER 10 MEQ PO TBCR: 10.0000 meq | EXTENDED_RELEASE_TABLET | Freq: Every day | ORAL | 1 refills | Status: DC

## 2023-05-03 ENCOUNTER — Other Ambulatory Visit: Payer: Self-pay | Admitting: Internal Medicine

## 2023-06-10 ENCOUNTER — Encounter (INDEPENDENT_AMBULATORY_CARE_PROVIDER_SITE_OTHER): Payer: Self-pay | Admitting: Vascular Surgery

## 2023-07-04 ENCOUNTER — Encounter: Payer: Self-pay | Admitting: Licensed Practical Nurse

## 2023-07-04 ENCOUNTER — Ambulatory Visit (INDEPENDENT_AMBULATORY_CARE_PROVIDER_SITE_OTHER): Payer: 59 | Admitting: Licensed Practical Nurse

## 2023-07-04 ENCOUNTER — Other Ambulatory Visit (HOSPITAL_COMMUNITY)
Admission: RE | Admit: 2023-07-04 | Discharge: 2023-07-04 | Disposition: A | Discharge status: Home / Self Care | Payer: 59 | Source: Ambulatory Visit | Attending: Licensed Practical Nurse | Admitting: Licensed Practical Nurse

## 2023-07-04 VITALS — BP 123/73 | HR 91 | Wt 287.0 lb

## 2023-07-04 DIAGNOSIS — N939 Abnormal uterine and vaginal bleeding, unspecified: Secondary | ICD-10-CM | POA: Insufficient documentation

## 2023-07-04 DIAGNOSIS — Z30432 Encounter for removal of intrauterine contraceptive device: Secondary | ICD-10-CM | POA: Diagnosis not present

## 2023-07-04 DIAGNOSIS — Z124 Encounter for screening for malignant neoplasm of cervix: Secondary | ICD-10-CM

## 2023-07-04 NOTE — Progress Notes (Signed)
    GYNECOLOGY OFFICE PROCEDURE NOTE  [name] is a 57 y.o. [name] here for [facility] IUD removal. No GYN concerns.  Last pap smear was on 03/2020 and was normal.  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 She has not cycled since. But she has been bleeding since Thursday, mainly she sees  red blood when wiping. She is not sure if she has gone through menopause, she has not  experienced any symptoms of menopause. She was told she would need another IUD after menopause to prevent abnormal cells.   OBJECTIVE: BP 123/73   Pulse 91   Wt 287 lb (130.2 kg)   BMI 44.29 kg/m  Gen: NAD SSE: cervix pink no lesions, bleeding present   IUD Removal  Patient identified, informed consent performed, consent signed.  Patient was in the dorsal lithotomy position, normal external genitalia was noted.  A speculum was placed in the patient's vagina,  The cervix was visualized, no lesions, no abnormal discharge with some light bleeding..  The strings of the IUD were grasped and pulled using ring forceps. The IUD was removed in its entirety. And shown to pt  Patient tolerated the procedure well.     ASSESSMENT: -IUD removal  -Vaginal bleeding related to IUD versus post menopausal bleeding   PLAN: -Per Dr Ramiro Harvest notes, pt is to have IUD removed at 8 years and then in 6 months have an [place] to determine if she needs another IUD. Given that I am not certain for the reason for the bleeding we will do a pelvic US sooner than later incase this is postmenopausal bleeding.  -IUD removed -pap collected  -FSH  and estradiol collected -Pelvic US ordered -Pt to fu with MD, aware she may need endometrial biopsy    Liv Rallis, Courtney Heys, CNM

## 2023-07-05 LAB — FOLLICLE STIMULATING HORMONE: FSH: 52.8 m[IU]/mL

## 2023-07-05 LAB — ESTRADIOL: Estradiol: 22.6 pg/mL

## 2023-07-07 ENCOUNTER — Other Ambulatory Visit: Payer: 59

## 2023-07-07 LAB — CYTOLOGY - PAP
Diagnosis: NEGATIVE
High risk HPV: NEGATIVE

## 2023-07-11 ENCOUNTER — Telehealth (INDEPENDENT_AMBULATORY_CARE_PROVIDER_SITE_OTHER): Payer: Self-pay | Admitting: Nurse Practitioner

## 2023-07-11 NOTE — Telephone Encounter (Signed)
The patient called the after-hours on-call line for medical advice regarding her continued use of Eliquis.  She was recently placed on Eliquis on 02/04/2023 due to DVT and pulmonary embolism.  It was recommended at that time that the patient remain on lifelong anticoagulation given her previous embolic phenomenon.  However the patient recently began to have vaginal bleeding.  She had an IUD in place and it was felt that she had not been having any cycles due to the IUD but during follow-up with her OB/GYN it was suspected that the patient is actually perimenopause based on some lab work.  Recently she is also began to have blood in her urine and her stool.  She held her dose of Eliquis this morning and has not noticed any worsening bleeding episodes.  She called her PCP office for advice regarding the bleeding and she was advised to contact us.  Had a very long discussion with the patient in regards to her bleeding.  While stopping the Eliquis may stop her bleeding it does not necessarily give [place] a root cause of why the bleeding began.  It is also possible that some of the bleeding she is experiencing may all be coming from 1 source but appearing to be in the stool and/or urine if it is all coming from her vagina.  Given her previous embolic episode in addition with DVT and PE if anticoagulation was to be altered from time she will likely need a possible IVC filter.  However, the filter is helpful for prevention of DVT does not prevent pulmonary embolism or any other embolic episodes.  Therefore her best case scenario is to try to determine what is causing her to bleed, because if it is something that is treatable we will treat that and she can really get her Eliquis.  She is very insistent that she does not want an IVC filter which is understandable.  I have advised the patient that it would be her best option to proceed to the emergency room for evaluation and workup of the cause of these multiple bleeding  episodes.  Otherwise issues declined to do so it would be in her best interest to follow-up with her PCP for further workup of the bleeding causes and we can see her in the office to discuss anticoagulation plans.

## 2023-07-12 ENCOUNTER — Encounter (INDEPENDENT_AMBULATORY_CARE_PROVIDER_SITE_OTHER): Payer: Self-pay | Admitting: Vascular Surgery

## 2023-07-12 NOTE — Telephone Encounter (Signed)
Discussion with the patient.  Per discussion yesterday.  I also discussed the situation with Dr. Wyn Quaker.  To the patient that she should probably start her Eliquis at the full dose.  Given her embolic episodes we will recommend a full 6 months of full dose anticoagulation which would be around the end of August.  At which time we can consider moving her to a prophylactic dose of Eliquis 2.5 mg.  The patient is advised that typically DOES not cause spontaneous bleeding usually there is a source.  Therefore she begins her Eliquis again and she begins to have worsening bleeding again, she is advised to proceed to the emergency room for full workup and evaluation for sources of bleeding.

## 2023-07-13 ENCOUNTER — Ambulatory Visit
Admission: RE | Admit: 2023-07-13 | Discharge: 2023-07-13 | Disposition: A | Discharge status: Home / Self Care | Payer: 59 | Source: Ambulatory Visit | Attending: Licensed Practical Nurse | Admitting: Licensed Practical Nurse

## 2023-07-13 ENCOUNTER — Encounter: Payer: Self-pay | Admitting: Licensed Practical Nurse

## 2023-07-13 ENCOUNTER — Other Ambulatory Visit: Payer: Self-pay

## 2023-07-13 DIAGNOSIS — N939 Abnormal uterine and vaginal bleeding, unspecified: Secondary | ICD-10-CM

## 2023-07-15 ENCOUNTER — Encounter: Payer: Self-pay | Admitting: Internal Medicine

## 2023-07-18 NOTE — Telephone Encounter (Signed)
Lmom that her GYN can order or she want Dr Welton Flakes to order

## 2023-07-19 NOTE — Progress Notes (Unsigned)
GYNECOLOGY PROGRESS NOTE  Subjective:    Patient ID: [name], female    DOB: 1966-10-16, 57 y.o.   MRN: [id number]  HPI  Patient is a morbidly obese 57 y.o. G27P2002 female who presents for further management of her history of endometrial hyperplasia.  Previously seen by Carie Caddy, CNM.  Patient was diagnosed ~ 8 years ago after issues with perimenopausal bleeding and had an IUD placed.  Recently had IUD removed several weeks ago  due to being expired and also experiencing bleeding again. Labs drawn that day note that patient is likely in menopause at this time.  However, patient also with several other risk factors for bleeding including anticoagulant use (Eliquis), and hypothyroidism (had medications adjusted in the past month or so). Recently had an ultrasound and was referred for review of results and repeat endometrial biopsy.   The following portions of the patient's history were reviewed and updated as appropriate:    Past Medical History:  Diagnosis Date   Berger's disease 2011   Buerger's disease (HCC)    Family history of adverse reaction to anesthesia    sister and daughter difficult to wake up   Folic acid deficiency    GERD (gastroesophageal reflux disease)    Hypertension    Hypothyroidism    IDA (iron deficiency anemia)    Osteoarthritis    PVC's (premature ventricular contractions)    Thrombocytosis     Family History  Problem Relation Age of Onset   Clotting disorder Mother    Hypertension Mother    Congestive Heart Failure Father    Hypertension Father    Stroke Sister    Rheum arthritis Sister    Congestive Heart Failure Maternal Grandmother    Colon cancer Maternal Grandfather 9   Skin cancer Maternal Aunt        Basal cell   Skin cancer Maternal Uncle        Basal cell   Skin cancer Cousin        Basal Cell    Past Surgical History:  Procedure Laterality Date   ADENOIDECTOMY     Small child   APPENDECTOMY     Age 45   COLON  SURGERY  1980   ileostomy and internal pouch age 26   CORONARY/GRAFT ACUTE MI REVASCULARIZATION N/A 06/12/2021   Procedure: Coronary/Graft Acute MI Revascularization;  Surgeon: Marcina Millard, MD;  Location: [facility] INVASIVE CV LAB;  Service: Cardiovascular;  Laterality: N/A;   DILATATION & CURETTAGE/HYSTEROSCOPY WITH MYOSURE N/A 05/02/2015   Procedure: DILATATION & CURETTAGE/HYSTEROSCOPY WITH MYOSURE;  Surgeon: Vena Austria, MD;  Location: [facility] ORS;  Service: Gynecology;  Laterality: N/A;   LAPAROSCOPIC GASTRIC SLEEVE RESECTION  07/06/2016   LEFT HEART CATH AND CORONARY ANGIOGRAPHY N/A 06/12/2021   Procedure: LEFT HEART CATH AND CORONARY ANGIOGRAPHY;  Surgeon: Marcina Millard, MD;  Location: [facility] INVASIVE CV LAB;  Service: Cardiovascular;  Laterality: N/A;    Social History   Socioeconomic History   Marital status: Married    Spouse name: Not on file   Number of children: Not on file   Years of education: Not on file   Highest education level: Not on file  Occupational History   Not on file  Tobacco Use   Smoking status: Former    Current packs/day: 0.00    Types: Cigarettes    Quit date: 04/28/2010    Years since quitting: 13.2   Smokeless tobacco: Never  Substance and Sexual Activity   Alcohol use:  No    Alcohol/week: 0.0 standard drinks of alcohol   Drug use: No   Sexual activity: Yes    Birth control/protection: None, I.U.D.  Other Topics Concern   Not on file  Social History Narrative   Not on file   Social Determinants of Health   Financial Resource Strain: Not on file  Food Insecurity: Not on file  Transportation Needs: Not on file  Physical Activity: Not on file  Stress: Not on file  Social Connections: Not on file  Intimate Partner Violence: Not on file    Current Outpatient Medications on File Prior to Visit  Medication Sig Dispense Refill   apixaban (ELIQUIS) 5 MG TABS tablet Take 1 tablet (5 mg total) by mouth 2 (two) times daily. 180 tablet 1    Cholecalciferol 50 MCG (2000 UT) CAPS Take by mouth.     Cranberry 1000 MG CAPS Take 1 tablet by mouth 2 (two) times daily.     cyanocobalamin (VITAMIN B12) 1000 MCG tablet Take 1,000 mcg by mouth daily.     folic acid (FOLVITE) 1 MG tablet TAKE 1 TABLET BY MOUTH EVERY DAY 90 tablet 3   ivermectin (STROMECTOL) 3 MG TABS tablet Take 24 mg by mouth daily.     levothyroxine (SYNTHROID) 137 MCG tablet Take 137 mcg by mouth daily before breakfast.     MAGNESIUM CHLORIDE PO Take 143 mg by mouth. Two tablets daily     NON FORMULARY Take 1 tablet by mouth daily. Shaklee Mens daily vitamin. NO IRON     potassium chloride (KLOR-CON M) 10 MEQ tablet Take 1 tablet (10 mEq total) by mouth daily. Take 2 at wed 108 tablet 1   potassium chloride (MICRO-K) 10 MEQ CR capsule Take 10 mEq by mouth daily. Takes two on weds     Probiotic Product (PROBIOTIC ADVANCED PO) Take 1 capsule by mouth daily.     No current facility-administered medications on file prior to visit.    Allergies  Allergen Reactions   Tape Rash    Some tapes cause a rash, possible the paper tape.   .  Review of Systems Pertinent items noted in HPI and remainder of comprehensive ROS otherwise negative.   Objective:   Blood pressure 119/79, pulse 74, weight (!) 305 lb (138.3 kg).  Body mass index is 47.06 kg/m. General appearance: no distress Remainder of exam deferred   Imaging:  US PELVIS TRANSVAGINAL NON-OB (TV ONLY) CLINICAL DATA:  Abnormal uterine bleeding.  Recent IUD removal.  EXAM: TRANSABDOMINAL AND TRANSVAGINAL ULTRASOUND OF PELVIS  TECHNIQUE: Both transabdominal and transvaginal ultrasound examinations of the pelvis were performed. Transabdominal technique was performed for global imaging of the pelvis including uterus, ovaries, adnexal regions, and pelvic cul-de-sac. It was necessary to proceed with endovaginal exam following the transabdominal exam to visualize the endometrium.  COMPARISON:  None  Available.  FINDINGS: Uterus  Measurements: 8.0 x 3.6 x 3.8 cm = volume: 57.4 mL. No fibroids or other mass visualized.  Endometrium  Thickness: 2.2 mm.  No focal abnormality visualized.  Right ovary  Measurements: 2.1 x 1.3 x 1.6 cm = volume: 2.3 mL. Normal appearance/no adnexal mass.  Left ovary  Measurements: 2.9 x 1.0 x 2.8 cm = volume: 4.6 mL. Normal appearance/no adnexal mass.  Other findings  No abnormal free fluid.  IMPRESSION: 1. No acute process. 2. Endometrium measures 2.2 mm. If bleeding remains unresponsive to hormonal or medical therapy, sonohysterogram should be considered for focal lesion work-up. (Ref: Radiological Reasoning:  Algorithmic Workup of Abnormal Vaginal Bleeding with Endovaginal Sonography and Sonohysterography. AJR 2008; 956:L87-56)  Electronically Signed   By: Annia Belt M.D.   On: 07/14/2023 08:24 US PELVIS (TRANSABDOMINAL ONLY) CLINICAL DATA:  Abnormal uterine bleeding.  Recent IUD removal.  EXAM: TRANSABDOMINAL AND TRANSVAGINAL ULTRASOUND OF PELVIS  TECHNIQUE: Both transabdominal and transvaginal ultrasound examinations of the pelvis were performed. Transabdominal technique was performed for global imaging of the pelvis including uterus, ovaries, adnexal regions, and pelvic cul-de-sac. It was necessary to proceed with endovaginal exam following the transabdominal exam to visualize the endometrium.  COMPARISON:  None Available.  FINDINGS: Uterus  Measurements: 8.0 x 3.6 x 3.8 cm = volume: 57.4 mL. No fibroids or other mass visualized.  Endometrium  Thickness: 2.2 mm.  No focal abnormality visualized.  Right ovary  Measurements: 2.1 x 1.3 x 1.6 cm = volume: 2.3 mL. Normal appearance/no adnexal mass.  Left ovary  Measurements: 2.9 x 1.0 x 2.8 cm = volume: 4.6 mL. Normal appearance/no adnexal mass.  Other findings  No abnormal free fluid.  IMPRESSION: 1. No acute process. 2. Endometrium measures 2.2 mm. If  bleeding remains unresponsive to hormonal or medical therapy, sonohysterogram should be considered for focal lesion work-up. (Ref: Radiological Reasoning: Algorithmic Workup of Abnormal Vaginal Bleeding with Endovaginal Sonography and Sonohysterography. AJR 2008; 433:I95-18)  Electronically Signed   By: Annia Belt M.D.   On: 07/14/2023 08:24   Assessment:   1. Postmenopausal bleeding   2. Endometrial hyperplasia without atypia, simple   3. Pelvic pressure in female   4. Dysuria      Plan:   1. Postmenopausal bleeding - US PELVIC COMPLETE WITH TRANSVAGINAL; Future - Discussed that bleeding could be from several different causes, including hyperplasia (although endometrial lining is thin on recent ultrasound), use of anticoagulant therapy, change in thyroid medications, etc. Patient notes she was advised by her Hematologist to hold Eliquis x 1 day to see if bleeding resolves, which it has. Will continue to monitor for any further episodes of bleeding.    2. Endometrial hyperplasia without atypia, simple - US PELVIC COMPLETE WITH TRANSVAGINAL; Future - Discussed that as IUD was removed several weeks, ago, would wait until ~ 3 months to repeat endometrial biopsy and ultrasound. At that time, patient wants to know if another IUD could be placed so that she does not have to worry about continued surveillance, and can also help her with menopausal symptoms, as recently she notes that she has been feeling more hot lately.   3. Pelvic pressure in female -Patient has been noting pelvic pressure along with discomfort with urination. Recent UA negative. Advised on AZO, can also prescribe Uribel.  - POCT urinalysis dipstick  4. Dysuria - UA performed today, negative. However patient insistent on symptoms so culture performed.  - Urine Culture  5. Obesity, Class III, BMI 40-49.9 (morbid obesity) (HCC) - Is risk factor for hyperplasia.    A total of 30 minutes were spent during this  encounter, including review of previous progress notes, recent imaging and labs, face-to-face with time with patient involving counseling and coordination of care, as well as documentation for current visit.  Hildred Laser, MD [facility]

## 2023-07-20 ENCOUNTER — Encounter: Payer: Self-pay | Admitting: Obstetrics and Gynecology

## 2023-07-20 ENCOUNTER — Ambulatory Visit (INDEPENDENT_AMBULATORY_CARE_PROVIDER_SITE_OTHER): Payer: 59 | Admitting: Obstetrics and Gynecology

## 2023-07-20 ENCOUNTER — Other Ambulatory Visit: Payer: Self-pay | Admitting: Obstetrics and Gynecology

## 2023-07-20 VITALS — BP 119/79 | HR 74 | Wt 305.0 lb

## 2023-07-20 DIAGNOSIS — R102 Pelvic and perineal pain: Secondary | ICD-10-CM

## 2023-07-20 DIAGNOSIS — N939 Abnormal uterine and vaginal bleeding, unspecified: Secondary | ICD-10-CM

## 2023-07-20 DIAGNOSIS — Z6841 Body Mass Index (BMI) 40.0 and over, adult: Secondary | ICD-10-CM

## 2023-07-20 DIAGNOSIS — N8501 Benign endometrial hyperplasia: Secondary | ICD-10-CM | POA: Diagnosis not present

## 2023-07-20 DIAGNOSIS — N950 Postmenopausal bleeding: Secondary | ICD-10-CM | POA: Diagnosis not present

## 2023-07-20 DIAGNOSIS — R300 Dysuria: Secondary | ICD-10-CM | POA: Diagnosis not present

## 2023-07-20 LAB — POCT URINALYSIS DIPSTICK
Bilirubin, UA: NEGATIVE
Glucose, UA: NEGATIVE
Ketones, UA: NEGATIVE
Leukocytes, UA: NEGATIVE
Nitrite, UA: NEGATIVE
Protein, UA: NEGATIVE
Spec Grav, UA: >=1.03 — AB (ref 1.010–1.025)
Urobilinogen, UA: 0.2 E.U./dL
pH, UA: 5 (ref 5.0–8.0)

## 2023-07-20 MED ORDER — URIBEL 81.6 MG PO TABS: 1.0000 | ORAL_TABLET | Freq: Three times a day (TID) | ORAL | 0 refills | Status: DC | PRN

## 2023-07-25 ENCOUNTER — Other Ambulatory Visit: Payer: Self-pay | Admitting: Internal Medicine

## 2023-07-25 DIAGNOSIS — E538 Deficiency of other specified B group vitamins: Secondary | ICD-10-CM

## 2023-07-26 ENCOUNTER — Encounter: Payer: Self-pay | Admitting: Obstetrics and Gynecology

## 2023-07-26 DIAGNOSIS — Z1231 Encounter for screening mammogram for malignant neoplasm of breast: Secondary | ICD-10-CM

## 2023-07-26 NOTE — Telephone Encounter (Signed)
Spoke with pt that please ask GYN for mammogram order due to they order before

## 2023-07-26 NOTE — Telephone Encounter (Signed)
Can order mammogram for patient and given number to call and schedule.

## 2023-08-02 ENCOUNTER — Encounter (INDEPENDENT_AMBULATORY_CARE_PROVIDER_SITE_OTHER): Payer: Self-pay | Admitting: Vascular Surgery

## 2023-08-03 ENCOUNTER — Other Ambulatory Visit (INDEPENDENT_AMBULATORY_CARE_PROVIDER_SITE_OTHER): Payer: Self-pay | Admitting: Nurse Practitioner

## 2023-08-03 MED ORDER — APIXABAN 2.5 MG PO TABS: 2.5000 mg | ORAL_TABLET | Freq: Two times a day (BID) | ORAL | 3 refills | Status: DC

## 2023-09-14 ENCOUNTER — Encounter: Payer: Self-pay | Admitting: Internal Medicine

## 2023-09-15 ENCOUNTER — Other Ambulatory Visit
Admission: RE | Admit: 2023-09-15 | Discharge: 2023-09-15 | Disposition: A | Discharge status: Home / Self Care | Payer: 59 | Source: Ambulatory Visit | Attending: General Surgery | Admitting: General Surgery

## 2023-09-15 DIAGNOSIS — K912 Postsurgical malabsorption, not elsewhere classified: Secondary | ICD-10-CM | POA: Insufficient documentation

## 2023-09-15 DIAGNOSIS — Z9884 Bariatric surgery status: Secondary | ICD-10-CM | POA: Diagnosis not present

## 2023-09-15 LAB — LDL CHOLESTEROL, DIRECT: Direct LDL: 70 mg/dL (ref 0–99)

## 2023-09-15 LAB — COMPREHENSIVE METABOLIC PANEL
ALT: 17 U/L (ref 0–44)
AST: 19 U/L (ref 15–41)
Albumin: 4.2 g/dL (ref 3.5–5.0)
Alkaline Phosphatase: 74 U/L (ref 38–126)
Anion gap: 11 (ref 5–15)
BUN: 18 mg/dL (ref 6–20)
CO2: 24 mmol/L (ref 22–32)
Calcium: 8.9 mg/dL (ref 8.9–10.3)
Chloride: 104 mmol/L (ref 98–111)
Creatinine, Ser: 0.74 mg/dL (ref 0.44–1.00)
GFR, Estimated: >60 mL/min (ref >60)
Glucose, Bld: 97 mg/dL (ref 70–99)
Potassium: 4.2 mmol/L (ref 3.5–5.1)
Sodium: 139 mmol/L (ref 135–145)
Total Bilirubin: 0.9 mg/dL (ref 0.3–1.2)
Total Protein: 7.8 g/dL (ref 6.5–8.1)

## 2023-09-15 LAB — CBC
HCT: 46.1 % — ABNORMAL HIGH (ref 36.0–46.0)
Hemoglobin: 15.4 g/dL — ABNORMAL HIGH (ref 12.0–15.0)
MCH: 31.3 pg (ref 26.0–34.0)
MCHC: 33.4 g/dL (ref 30.0–36.0)
MCV: 93.7 fL (ref 80.0–100.0)
Platelets: 231 10*3/uL (ref 150–400)
RBC: 4.92 MIL/uL (ref 3.87–5.11)
RDW: 14.6 % (ref 11.5–15.5)
WBC: 8.6 10*3/uL (ref 4.0–10.5)
nRBC: 0 % (ref 0.0–0.2)

## 2023-09-15 LAB — VITAMIN D 25 HYDROXY (VIT D DEFICIENCY, FRACTURES): Vit D, 25-Hydroxy: 54.56 ng/mL (ref 30–100)

## 2023-09-15 LAB — LIPID PANEL
Cholesterol: 140 mg/dL (ref 0–200)
HDL: 50 mg/dL (ref >40)
LDL Cholesterol: 76 mg/dL (ref 0–99)
Total CHOL/HDL Ratio: 2.8 {ratio}
Triglycerides: 68 mg/dL (ref <150)
VLDL: 14 mg/dL (ref 0–40)

## 2023-09-15 LAB — IRON AND TIBC
Iron: 76 ug/dL (ref 28–170)
Saturation Ratios: 18 % (ref 10.4–31.8)
TIBC: 427 ug/dL (ref 250–450)
UIBC: 351 ug/dL

## 2023-09-15 LAB — HEMOGLOBIN A1C
Hgb A1c MFr Bld: 5 % (ref 4.8–5.6)
Mean Plasma Glucose: 96.8 mg/dL

## 2023-09-15 LAB — TRANSFERRIN: Transferrin: 317 mg/dL (ref 192–382)

## 2023-09-15 LAB — MAGNESIUM: Magnesium: 1.9 mg/dL (ref 1.7–2.4)

## 2023-09-15 LAB — FOLATE: Folate: >40 ng/mL (ref >5.9)

## 2023-09-15 LAB — AMYLASE: Amylase: 705 U/L — ABNORMAL HIGH (ref 28–100)

## 2023-09-15 LAB — VITAMIN B12: Vitamin B-12: 575 pg/mL (ref 180–914)

## 2023-09-15 LAB — PHOSPHORUS: Phosphorus: 2.6 mg/dL (ref 2.5–4.6)

## 2023-09-15 LAB — LACTATE DEHYDROGENASE: LDH: 86 U/L — ABNORMAL LOW (ref 98–192)

## 2023-09-15 LAB — T4, FREE: Free T4: 1.33 ng/dL — ABNORMAL HIGH (ref 0.61–1.12)

## 2023-09-15 LAB — TSH: TSH: 3.843 u[IU]/mL (ref 0.350–4.500)

## 2023-09-15 LAB — FERRITIN: Ferritin: 112 ng/mL (ref 11–307)

## 2023-09-16 ENCOUNTER — Encounter (INDEPENDENT_AMBULATORY_CARE_PROVIDER_SITE_OTHER): Payer: Self-pay | Admitting: Vascular Surgery

## 2023-09-17 LAB — PARATHYROID HORMONE, INTACT (NO CA): PTH: 51 pg/mL (ref 15–65)

## 2023-09-27 ENCOUNTER — Encounter: Payer: 59 | Admitting: Internal Medicine

## 2023-09-29 ENCOUNTER — Encounter: Payer: 59 | Admitting: Internal Medicine

## 2023-10-08 ENCOUNTER — Other Ambulatory Visit: Payer: Self-pay | Admitting: Nurse Practitioner

## 2023-10-11 ENCOUNTER — Encounter: Payer: 59 | Admitting: Internal Medicine

## 2023-10-11 ENCOUNTER — Ambulatory Visit
Admission: RE | Admit: 2023-10-11 | Discharge: 2023-10-11 | Disposition: A | Discharge status: Home / Self Care | Payer: 59 | Source: Ambulatory Visit | Attending: Obstetrics and Gynecology | Admitting: Obstetrics and Gynecology

## 2023-10-11 DIAGNOSIS — N8501 Benign endometrial hyperplasia: Secondary | ICD-10-CM | POA: Insufficient documentation

## 2023-10-16 NOTE — Progress Notes (Signed)
[facility] CONSULT NOTE  Patient Care Team: Lyndon Code, MD as PCP - General (Internal Medicine) Earna Coder, MD as Consulting Physician (Oncology)  CHIEF COMPLAINTS/PURPOSE OF CONSULTATION: DVT/PE  # 15th, DEC- 2021-severe abdominal pain CT scan- 1/3rd splenic infarct [facility]];lovenox SQ x1 week; NO ANTICOAGULATION at discharge. TEE-rule out endocarditis negative; coag negative staph-likely contaminant.  Vascular surgery/rheumatology evaluation-? PAN-CT negative [given hx of Buerger's disease]; protein C&S- WNL.  # FEB 2024- CTA- Bilateral nonocclusive pulmonary emboli, most evident on the left, where emboli extend from the lower lobe pulmonary artery into segmental branches. Small nonocclusive right upper lobe pulmonary embolism. 2. No other evidence of an acute abnormality.  # ?  Buerger's disease [Dr.Pandit; rheumatology [facility]; Dr.Dew-currently non-smoker] # 2011-prothrombin gene mutation/factor V Leiden normal. # COVID-19 [Oct 2020];   # UC s/p pancolectomy [as Teenager]; gastric sleeve surgery-2017.   Oncology History   No history exists.     HISTORY OF PRESENTING ILLNESS: Patient ambulating-with assistance/wheel chair. Accompanied by family.   [name] 57 y.o.  female patient to morbidly obese, multiple medical problems including-small intestinal bacterial overgrowth, history of ulcerative colitis status post colectomy, ?  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    Patient has been on Eliquis since the acute PE in February 2024.  Patient is currently on Eliquis 2.5 mg.   Patient noted to have elevated hematocrit/hemoglobin intermittently over the last many months.  Patient has been referred to [place] for further evaluation recommendations.  Review of Systems  Constitutional:  Positive for malaise/fatigue. Negative for chills, diaphoresis, fever and weight loss.   HENT:  Negative for nosebleeds and sore throat.   Eyes:  Negative for double vision.  Respiratory:  Negative for cough, hemoptysis, sputum production, shortness of breath and wheezing.   Cardiovascular:  Negative for chest pain, palpitations, orthopnea and leg swelling.  Gastrointestinal:  Negative for abdominal pain, blood in stool, constipation, diarrhea, heartburn, melena, nausea and vomiting.  Genitourinary:  Negative for dysuria, frequency and urgency.  Musculoskeletal:  Positive for back pain and joint pain.  Skin: Negative.  Negative for itching and rash.  Neurological:  Negative for dizziness, tingling, focal weakness, weakness and headaches.  Endo/Heme/Allergies:  Does not bruise/bleed easily.  Psychiatric/Behavioral:  Negative for depression. The patient is not nervous/anxious and does not have insomnia.      MEDICAL HISTORY:  Past Medical History:  Diagnosis Date   Berger's disease 2011   Buerger's disease (HCC)    Family history of adverse reaction to anesthesia    sister and daughter difficult to wake up   Folic acid deficiency    GERD (gastroesophageal reflux disease)    Hypertension    Hypothyroidism    IDA (iron deficiency anemia)    Osteoarthritis    PVC's (premature ventricular contractions)    Thrombocytosis     SURGICAL HISTORY: Past Surgical History:  Procedure Laterality Date   ADENOIDECTOMY     Small child   APPENDECTOMY     Age 49   COLON SURGERY  1980   ileostomy and internal pouch age 89   CORONARY/GRAFT ACUTE MI REVASCULARIZATION N/A 06/12/2021   Procedure: Coronary/Graft Acute MI Revascularization;  Surgeon: Marcina Millard, MD;  Location: [facility] INVASIVE CV LAB;  Service: Cardiovascular;  Laterality: N/A;   DILATATION & CURETTAGE/HYSTEROSCOPY WITH MYOSURE N/A 05/02/2015   Procedure: DILATATION & CURETTAGE/HYSTEROSCOPY WITH MYOSURE;  Surgeon: Vena Austria, MD;  Location: [facility] ORS;  Service: Gynecology;  Laterality: N/A;   LAPAROSCOPIC  GASTRIC SLEEVE RESECTION  07/06/2016   LEFT HEART CATH AND CORONARY ANGIOGRAPHY N/A 06/12/2021   Procedure: LEFT HEART CATH AND CORONARY ANGIOGRAPHY;  Surgeon: Marcina Millard, MD;  Location: [facility] INVASIVE CV LAB;  Service: Cardiovascular;  Laterality: N/A;    SOCIAL HISTORY: Social History   Socioeconomic History   Marital status: Married    Spouse name: Not on file   Number of children: Not on file   Years of education: Not on file   Highest education level: Not on file  Occupational History   Not on file  Tobacco Use   Smoking status: Former    Current packs/day: 0.00    Types: Cigarettes    Quit date: 04/28/2010    Years since quitting: 13.5   Smokeless tobacco: Never  Substance and Sexual Activity   Alcohol use: No    Alcohol/week: 0.0 standard drinks of alcohol   Drug use: No   Sexual activity: Yes    Birth control/protection: None, I.U.D.  Other Topics Concern   Not on file  Social History Narrative   Not on file   Social Determinants of Health   Financial Resource Strain: Not on file  Food Insecurity: Not on file  Transportation Needs: Not on file  Physical Activity: Not on file  Stress: Not on file  Social Connections: Not on file  Intimate Partner Violence: Not on file    FAMILY HISTORY: Family History  Problem Relation Age of Onset   Clotting disorder Mother    Hypertension Mother    Congestive Heart Failure Father    Hypertension Father    Stroke Sister    Rheum arthritis Sister    Congestive Heart Failure Maternal Grandmother    Colon cancer Maternal Grandfather 32   Skin cancer Maternal Aunt        Basal cell   Skin cancer Maternal Uncle        Basal cell   Skin cancer Cousin        Basal Cell    ALLERGIES:  is allergic to tape.  MEDICATIONS:  Current Outpatient Medications  Medication Sig Dispense Refill   apixaban (ELIQUIS) 2.5 MG TABS tablet Take 1 tablet (2.5 mg total) by mouth 2 (two) times daily. 60 tablet 3   Cholecalciferol  50 MCG (2000 UT) CAPS Take by mouth.     Cranberry 1000 MG CAPS Take 1 tablet by mouth 2 (two) times daily.     famotidine (PEPCID) 20 MG tablet Take 20 mg by mouth 2 (two) times daily.     folic acid (FOLVITE) 1 MG tablet TAKE 1 TABLET BY MOUTH EVERY DAY 90 tablet 4   ivermectin (STROMECTOL) 3 MG TABS tablet Take 24 mg by mouth daily.     levothyroxine (SYNTHROID) 137 MCG tablet Take 125 mcg by mouth daily before breakfast.     MAGNESIUM CHLORIDE PO Take 143 mg by mouth. Two tablets daily     NON FORMULARY Take 1 tablet by mouth daily. Shaklee Mens daily vitamin. NO IRON     potassium chloride (KLOR-CON M) 10 MEQ tablet TAKE 1 TABLET BY MOUTH DAILY (TAKE 2 ON WEDNESDAY) 108 tablet 1   potassium chloride (MICRO-K) 10 MEQ CR capsule Take 10 mEq by mouth daily. Takes two on weds     Probiotic Product (PROBIOTIC ADVANCED PO) Take 1 capsule by mouth daily.     No current facility-administered medications for this visit.      Marland Kitchen  PHYSICAL EXAMINATION:  Vitals:   10/17/23 1540  BP: 119/69  Pulse: 82  Temp: (!) 97.1 F (36.2 C)  SpO2: 99%    Filed Weights   10/17/23 1540  Weight: (!) 305 lb (138.3 kg)     Physical Exam Constitutional:      Comments: Patient morbidly obese.  She is in a wheelchair.  Accompanied by family.  HENT:     Head: Normocephalic and atraumatic.     Mouth/Throat:     Pharynx: No oropharyngeal exudate.  Eyes:     Pupils: Pupils are equal, round, and reactive to light.  Cardiovascular:     Rate and Rhythm: Normal rate and regular rhythm.  Pulmonary:     Effort: No respiratory distress.     Breath sounds: No wheezing.     Comments: Decreased breath sounds bilateral bases.  No wheeze or crackles. Abdominal:     General: Bowel sounds are normal. There is no distension.     Palpations: Abdomen is soft. There is no mass.     Tenderness: There is no abdominal tenderness. There is no guarding or rebound.  Musculoskeletal:        General: No tenderness.  Normal range of motion.     Cervical back: Normal range of motion and neck supple.  Skin:    General: Skin is warm.  Neurological:     Mental Status: She is alert and oriented to person, place, and time.  Psychiatric:        Mood and Affect: Affect normal.      LABORATORY DATA:  I have reviewed the data as listed Lab Results  Component Value Date   WBC 9.1 10/17/2023   HGB 13.8 10/17/2023   HCT 42.3 10/17/2023   MCV 95.9 10/17/2023   PLT 246 10/17/2023   Recent Labs    02/09/23 1547 03/05/23 1147 09/15/23 1819 10/17/23 1635  NA 137 138 139 135  K 3.8 3.5 4.2 4.2  CL 103 107 104 101  CO2 25 22 24 26   GLUCOSE 92 107* 97 95  BUN 12 23* 18 16  CREATININE 0.66 0.62 0.74 0.74  CALCIUM 9.2 8.7* 8.9 8.9  GFRNONAA >60 >60 >60 >60  PROT 7.4  --  7.8 7.1  ALBUMIN 3.9  --  4.2 3.7  AST 22  --  19 20  ALT 18  --  17 20  ALKPHOS 67  --  74 76  BILITOT 1.0  --  0.9 0.5    RADIOGRAPHIC STUDIES: I have personally reviewed the radiological images as listed and agreed with the findings in the report. US PELVIC COMPLETE WITH TRANSVAGINAL  Result Date: 11/04/2023 CLINICAL DATA:  Endometrial hyperplasia.  IUD removal. EXAM: TRANSABDOMINAL AND TRANSVAGINAL ULTRASOUND OF PELVIS TECHNIQUE: Both transabdominal and transvaginal ultrasound examinations of the pelvis were performed. Transabdominal technique was performed for global imaging of the pelvis including uterus, ovaries, adnexal regions, and pelvic cul-de-sac. It was necessary to proceed with endovaginal exam following the transabdominal exam to visualize the uterus, endometrium, ovaries and adnexal regions. COMPARISON:  07/13/2023. FINDINGS: Uterus Measurements: 8.3 x 3.6 x 3.3 cm = volume: 52 mL. Possible small 4 mm fibroid in the uterine body. Nabothian cysts. Endometrium Thickness: 5 mm.  No focal abnormality visualized. Right ovary Measurements: 1.7 x 0.9 x 1.6 cm = volume: 1.3 mL. Normal appearance/no adnexal mass. Left ovary  Measurements: 2.5 x 1.1 x 1.8 cm = volume: 2.6 mL. Normal appearance/no adnexal mass. Other findings No abnormal free  fluid. IMPRESSION: Endometrium measures 5 mm, within normal limits for a postmenopausal female in the absence of abnormal uterine bleeding. In the setting of post-menopausal bleeding, endometrial sampling is indicated to exclude carcinoma. If results are benign, sonohysterogram should be considered for focal lesion work-up. (Ref: Radiological Reasoning: Algorithmic Workup of Abnormal Vaginal Bleeding with Endovaginal Sonography and Sonohysterography. AJR 2008; 161:W96-04) Electronically Signed   By: Leanna Battles M.D.   On: 11/04/2023 09:53     ASSESSMENT & PLAN:   Acute pulmonary embolism without acute cor pulmonale [facility]) # 10th FEB 2024- Bilateral nonocclusive pulmonary emboli, most evident on the  left, where emboli extend from the lower lobe pulmonary artery into segmental branches. Small nonocclusive right upper lobe pulmonary embolism. FEB 10th, 2024- Occlusive DVT within the RIGHT distal femoral, popliteal and proximal posterior tibial veins.  Currently on Eliquis 2.5 mg twice daily.  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  Previous hypercoagulable workup 2022-negative for any obvious cause.  # Elevated HCT/Hb-hemoglobin 16 hematocrit 46-intermittent likely secondary rather than polycythemia vera.  Check EPO level JAK2 mutation status [especially given history of blood clots]; would not recommend a bone marrow biopsy at this time.  # ? Burger's disease involving upper extremities-currently asymptomatic [quit smoking]; on Pletal plus asprin- ? Worsened Covid-. Defer to PCP.   # DISPOSITION: # labs today-  # follow up in 2 weeks- MD- virtual; no labs-  Dr.B   All questions were answered. The patient knows to call the clinic with any  problems, questions or concerns.   Earna Coder, MD 11/09/2023 9:50 AM

## 2023-10-16 NOTE — Assessment & Plan Note (Deleted)
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  Status post Lovenox x1 week in hospital.  No anticoagulation at discharge.  Continue aspirin plus Pletal. REPEAT CT scan [for worsening]- FEB 26th, 2022- NEG for any acute splenic infarct.  Continue to hold off any anticoagulation. I spoke to patient's cardiologist at [facility] who feels that patient does not have any confirm history of arrhythmia that could explain embolic source. Hence I would also recommend avoiding anticoagulation.  # 10th FEB 2024- Bilateral nonocclusive pulmonary emboli, most evident on the  left, where emboli extend from the lower lobe pulmonary artery into segmental branches. Small nonocclusive right upper lobe pulmonary embolism. FEB 10th, 2024- Occlusive DVT within the RIGHT distal femoral, popliteal and proximal posterior tibial veins  # Abdominal pain-unclear etiology.  Patient has complicated GI history.  Recommend evaluation with gastroenterologist.  Recommend patient return to PCP for GI referral.   # ? Burger's disease involving upper extremities-currently asymptomatic [quit smoking]; on Pletal plus asprin.   Patient was requested to call PCPs office for further referral/recommendation.  Dr.Khan aware of our recommendations.  Patient is in agreement.  # DISPOSITION: # follow up as needed- Dr.B

## 2023-10-17 ENCOUNTER — Inpatient Hospital Stay: Payer: 59

## 2023-10-17 ENCOUNTER — Inpatient Hospital Stay: Payer: 59 | Attending: Internal Medicine | Admitting: Internal Medicine

## 2023-10-17 ENCOUNTER — Encounter: Payer: Self-pay | Admitting: Internal Medicine

## 2023-10-17 VITALS — BP 119/69 | HR 82 | Temp 97.1°F | Wt 305.0 lb

## 2023-10-17 DIAGNOSIS — I731 Thromboangiitis obliterans [Buerger's disease]: Secondary | ICD-10-CM | POA: Insufficient documentation

## 2023-10-17 DIAGNOSIS — R5383 Other fatigue: Secondary | ICD-10-CM | POA: Insufficient documentation

## 2023-10-17 DIAGNOSIS — I2699 Other pulmonary embolism without acute cor pulmonale: Secondary | ICD-10-CM

## 2023-10-17 DIAGNOSIS — N8500 Endometrial hyperplasia, unspecified: Secondary | ICD-10-CM | POA: Insufficient documentation

## 2023-10-17 DIAGNOSIS — Z86711 Personal history of pulmonary embolism: Secondary | ICD-10-CM | POA: Insufficient documentation

## 2023-10-17 DIAGNOSIS — Z79890 Hormone replacement therapy (postmenopausal): Secondary | ICD-10-CM | POA: Insufficient documentation

## 2023-10-17 DIAGNOSIS — Z79899 Other long term (current) drug therapy: Secondary | ICD-10-CM | POA: Diagnosis not present

## 2023-10-17 DIAGNOSIS — Z800 Family history of malignant neoplasm of digestive organs: Secondary | ICD-10-CM | POA: Diagnosis not present

## 2023-10-17 DIAGNOSIS — E039 Hypothyroidism, unspecified: Secondary | ICD-10-CM | POA: Insufficient documentation

## 2023-10-17 DIAGNOSIS — D735 Infarction of spleen: Secondary | ICD-10-CM | POA: Diagnosis not present

## 2023-10-17 DIAGNOSIS — Z8249 Family history of ischemic heart disease and other diseases of the circulatory system: Secondary | ICD-10-CM | POA: Diagnosis not present

## 2023-10-17 DIAGNOSIS — Z9089 Acquired absence of other organs: Secondary | ICD-10-CM | POA: Diagnosis not present

## 2023-10-17 DIAGNOSIS — E669 Obesity, unspecified: Secondary | ICD-10-CM | POA: Insufficient documentation

## 2023-10-17 DIAGNOSIS — Z823 Family history of stroke: Secondary | ICD-10-CM | POA: Insufficient documentation

## 2023-10-17 DIAGNOSIS — Z808 Family history of malignant neoplasm of other organs or systems: Secondary | ICD-10-CM | POA: Insufficient documentation

## 2023-10-17 DIAGNOSIS — M25529 Pain in unspecified elbow: Secondary | ICD-10-CM | POA: Diagnosis not present

## 2023-10-17 DIAGNOSIS — R718 Other abnormality of red blood cells: Secondary | ICD-10-CM

## 2023-10-17 DIAGNOSIS — M549 Dorsalgia, unspecified: Secondary | ICD-10-CM | POA: Diagnosis not present

## 2023-10-17 DIAGNOSIS — N888 Other specified noninflammatory disorders of cervix uteri: Secondary | ICD-10-CM | POA: Diagnosis not present

## 2023-10-17 DIAGNOSIS — Z86718 Personal history of other venous thrombosis and embolism: Secondary | ICD-10-CM | POA: Diagnosis not present

## 2023-10-17 DIAGNOSIS — Z9049 Acquired absence of other specified parts of digestive tract: Secondary | ICD-10-CM | POA: Diagnosis not present

## 2023-10-17 DIAGNOSIS — I10 Essential (primary) hypertension: Secondary | ICD-10-CM | POA: Insufficient documentation

## 2023-10-17 DIAGNOSIS — Z8261 Family history of arthritis: Secondary | ICD-10-CM | POA: Insufficient documentation

## 2023-10-17 DIAGNOSIS — Z7901 Long term (current) use of anticoagulants: Secondary | ICD-10-CM | POA: Insufficient documentation

## 2023-10-17 LAB — CBC WITH DIFFERENTIAL/PLATELET
Abs Immature Granulocytes: 0.06 10*3/uL (ref 0.00–0.07)
Basophils Absolute: 0 10*3/uL (ref 0.0–0.1)
Basophils Relative: 0 %
Eosinophils Absolute: 0.2 10*3/uL (ref 0.0–0.5)
Eosinophils Relative: 2 %
HCT: 42.3 % (ref 36.0–46.0)
Hemoglobin: 13.8 g/dL (ref 12.0–15.0)
Immature Granulocytes: 1 %
Lymphocytes Relative: 45 %
Lymphs Abs: 4.2 10*3/uL — ABNORMAL HIGH (ref 0.7–4.0)
MCH: 31.3 pg (ref 26.0–34.0)
MCHC: 32.6 g/dL (ref 30.0–36.0)
MCV: 95.9 fL (ref 80.0–100.0)
Monocytes Absolute: 0.9 10*3/uL (ref 0.1–1.0)
Monocytes Relative: 10 %
Neutro Abs: 3.8 10*3/uL (ref 1.7–7.7)
Neutrophils Relative %: 42 %
Platelets: 246 10*3/uL (ref 150–400)
RBC: 4.41 MIL/uL (ref 3.87–5.11)
RDW: 14.3 % (ref 11.5–15.5)
WBC: 9.1 10*3/uL (ref 4.0–10.5)
nRBC: 0 % (ref 0.0–0.2)

## 2023-10-17 LAB — COMPREHENSIVE METABOLIC PANEL
ALT: 20 U/L (ref 0–44)
AST: 20 U/L (ref 15–41)
Albumin: 3.7 g/dL (ref 3.5–5.0)
Alkaline Phosphatase: 76 U/L (ref 38–126)
Anion gap: 8 (ref 5–15)
BUN: 16 mg/dL (ref 6–20)
CO2: 26 mmol/L (ref 22–32)
Calcium: 8.9 mg/dL (ref 8.9–10.3)
Chloride: 101 mmol/L (ref 98–111)
Creatinine, Ser: 0.74 mg/dL (ref 0.44–1.00)
GFR, Estimated: >60 mL/min (ref >60)
Glucose, Bld: 95 mg/dL (ref 70–99)
Potassium: 4.2 mmol/L (ref 3.5–5.1)
Sodium: 135 mmol/L (ref 135–145)
Total Bilirubin: 0.5 mg/dL (ref <1.2)
Total Protein: 7.1 g/dL (ref 6.5–8.1)

## 2023-10-17 LAB — LACTATE DEHYDROGENASE: LDH: 96 U/L — ABNORMAL LOW (ref 98–192)

## 2023-10-17 NOTE — Assessment & Plan Note (Addendum)
#   10th FEB 2024- Bilateral nonocclusive pulmonary emboli, most evident on the  left, where emboli extend from the lower lobe pulmonary artery into segmental branches. Small nonocclusive right upper lobe pulmonary embolism. FEB 10th, 2024- Occlusive DVT within the RIGHT distal femoral, popliteal and proximal posterior tibial veins.  Currently on Eliquis 2.5 mg twice daily.  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  Previous hypercoagulable workup 2022-negative for any obvious cause.  # Elevated HCT/Hb-hemoglobin 16 hematocrit 46-intermittent likely secondary rather than polycythemia vera.  Check EPO level JAK2 mutation status [especially given history of blood clots]; would not recommend a bone marrow biopsy at this time.  # ? Burger's disease involving upper extremities-currently asymptomatic [quit smoking]; on Pletal plus asprin- ? Worsened Covid-. Defer to PCP.   # DISPOSITION: # labs today-  # follow up in 2 weeks- MD- virtual; no labs-  Dr.B

## 2023-10-17 NOTE — Progress Notes (Unsigned)
     GYNECOLOGY OFFICE PROCEDURE NOTE  [name] is a 57 y.o. [name] here for Mirena IUD insertion and Endometrial Biopsy. No GYN concerns.  Last pap smear was on 07/04/2023 and was normal.  IUD Insertion Procedure Note Patient identified, informed consent performed, consent signed.   Discussed risks of irregular bleeding, cramping, infection, malpositioning or misplacement of the IUD outside the uterus which may require further procedure such as laparoscopy. Also discussed >99% contraception efficacy, increased risk of ectopic pregnancy with failure of method.   Emphasized that this did not protect against STIs, condoms recommended during all sexual encounters. Time out was performed.  Urine pregnancy test negative.  Speculum placed in the vagina.  Cervix visualized.  Cleaned with Betadine x 2.  Grasped anteriorly with a single tooth tenaculum.  Uterus sounded to *** cm.  *** IUD placed per manufacturer's recommendations.  Strings trimmed to 3 cm. Tenaculum was removed, good hemostasis noted.  Patient tolerated procedure well.   Patient was given post-procedure instructions.  She was advised to have backup contraception for one week.  Patient was also asked to check IUD strings periodically and follow up in 4 weeks for IUD check.    Endometrial Biopsy Procedure Note  The patient is positioned on the exam table in the dorsal lithotomy position. Bimanual exam confirms uterine position and size. A Graves speculum is placed into the vagina. A single toothed tenaculum is placed onto the anterior lip of the cervix. The pipette is placed into the endocervical canal and is advanced to the uterine fundus. Using a piston like technique, with vacuum created by withdrawing the stylus, the endometrial specimen is obtained and transferred to the biopsy container. Minimal bleeding is encountered. The procedure is well tolerated.   Uterine Position:mid anterior posterior   Uterine Length: 6 7 8 9 10   cm   Uterine Specimen: Scant Average Lush  Post procedure instructions are given. The patient is scheduled for follow up appointment.     Hildred Laser, MD [place] [facility]

## 2023-10-17 NOTE — Progress Notes (Signed)
Pt states she felt better while on blood thinners. Concerned blood counts were going up at some point then found blood clots. Concerned blood getting to thick, discuss eliquis dosing.

## 2023-10-18 ENCOUNTER — Encounter: Payer: Self-pay | Admitting: Obstetrics and Gynecology

## 2023-10-18 ENCOUNTER — Other Ambulatory Visit (HOSPITAL_COMMUNITY)
Admission: RE | Admit: 2023-10-18 | Discharge: 2023-10-18 | Disposition: A | Discharge status: Home / Self Care | Payer: 59 | Source: Ambulatory Visit | Attending: Obstetrics and Gynecology | Admitting: Obstetrics and Gynecology

## 2023-10-18 ENCOUNTER — Ambulatory Visit (INDEPENDENT_AMBULATORY_CARE_PROVIDER_SITE_OTHER): Payer: 59 | Admitting: Obstetrics and Gynecology

## 2023-10-18 VITALS — BP 118/71 | HR 83 | Resp 16 | Ht 67.5 in | Wt 305.0 lb

## 2023-10-18 DIAGNOSIS — Z7901 Long term (current) use of anticoagulants: Secondary | ICD-10-CM | POA: Diagnosis not present

## 2023-10-18 DIAGNOSIS — Z30430 Encounter for insertion of intrauterine contraceptive device: Secondary | ICD-10-CM

## 2023-10-18 DIAGNOSIS — N950 Postmenopausal bleeding: Secondary | ICD-10-CM

## 2023-10-18 DIAGNOSIS — N8501 Benign endometrial hyperplasia: Secondary | ICD-10-CM | POA: Diagnosis not present

## 2023-10-18 DIAGNOSIS — Z9229 Personal history of other drug therapy: Secondary | ICD-10-CM

## 2023-10-18 LAB — ERYTHROPOIETIN: Erythropoietin: 8.7 m[IU]/mL (ref 2.6–18.5)

## 2023-10-18 MED ORDER — LEVONORGESTREL 20 MCG/DAY IU IUD
1.0000 | INTRAUTERINE_SYSTEM | Freq: Once | INTRAUTERINE | Status: AC
Start: 2023-10-18 — End: 2023-10-18
  Administered 2023-10-18: 1 via INTRAUTERINE

## 2023-10-18 NOTE — Patient Instructions (Signed)
IUD PLACEMENT POST-PROCEDURE INSTRUCTIONS ° °You may take Ibuprofen, Aleve or Tylenol for pain if needed.  Cramping should resolve within in 24 hours. ° °You may have a small amount of spotting.  You should wear a mini pad for the next few days. ° °You may have intercourse after 24 hours.  If you using this for birth control, it is effective immediately. ° °You need to call if you have any pelvic pain, fever, heavy bleeding or foul smelling vaginal discharge.  Irregular bleeding is common the first several months after having an IUD placed. You do not need to call for this reason unless you are concerned. ° °Shower or bathe as normal ° °You should have a follow-up appointment in 4-8 weeks for a re-check to make sure you are not having any problems. °

## 2023-10-20 ENCOUNTER — Encounter: Payer: Self-pay | Admitting: Obstetrics and Gynecology

## 2023-10-20 LAB — SURGICAL PATHOLOGY

## 2023-10-23 LAB — JAK2 GENOTYPR

## 2023-11-01 ENCOUNTER — Encounter: Payer: Self-pay | Admitting: Internal Medicine

## 2023-11-08 NOTE — Telephone Encounter (Signed)
Pt has mychart visit with Dr. Leonard Schwartz tomorrow, he will address then. Looks like all labs are in.

## 2023-11-09 ENCOUNTER — Inpatient Hospital Stay (HOSPITAL_BASED_OUTPATIENT_CLINIC_OR_DEPARTMENT_OTHER): Payer: 59 | Admitting: Internal Medicine

## 2023-11-09 DIAGNOSIS — Z7901 Long term (current) use of anticoagulants: Secondary | ICD-10-CM

## 2023-11-09 DIAGNOSIS — I2699 Other pulmonary embolism without acute cor pulmonale: Secondary | ICD-10-CM

## 2023-11-09 NOTE — Assessment & Plan Note (Addendum)
#   10th FEB 2024- Bilateral nonocclusive pulmonary emboli, most evident on the  left, where emboli extend from the lower lobe pulmonary artery into segmental branches. Small nonocclusive right upper lobe pulmonary embolism. FEB 10th, 2024- Occlusive DVT within the RIGHT distal femoral, popliteal and proximal posterior tibial veins.  Currently on Eliquis 2.5 mg twice daily. Stable.   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  Previous hypercoagulable workup 2022-negative for any obvious cause. Continue 2.5 mg BID for now. Discussed with Dr.Dew.   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  NOV 2024-EPO level JAK2 mutation- NEGATIVE. [especially given history of blood clots]; would not recommend a bone marrow biopsy at this time. Recommend prn Blood donation  # ? Burger's disease involving upper extremities-currently asymptomatic [quit smoking]; on Pletal plus asprin- ? Worsened Covid-. Stable.   # DISPOSITION: # as needed- Dr.B

## 2023-11-09 NOTE — Progress Notes (Signed)
I connected with Hewitt Shorts on 11/09/23 at  8:30 AM EST by video enabled telemedicine visit and verified that I am speaking with the correct person using two identifiers.  I discussed the limitations, risks, security and privacy concerns of performing an evaluation and management service by telemedicine and the availability of in-person appointments. I also discussed with the patient that there may be a patient responsible charge related to this service. The patient expressed understanding and agreed to proceed.    Other persons participating in the visit and their role in the encounter: RN/medical reconciliation Patient's location: home Provider's location: office  CHIEF COMPLAINTS/PURPOSE OF CONSULTATION: DVT/PE  # 15th, DEC- 2021-severe abdominal pain CT scan- 1/3rd splenic infarct [facility]];lovenox SQ x1 week; NO ANTICOAGULATION at discharge. TEE-rule out endocarditis negative; coag negative staph-likely contaminant.  Vascular surgery/rheumatology evaluation-? PAN-CT negative [given hx of Buerger's disease]; protein C&S- WNL.  # FEB 2024- CTA- Bilateral nonocclusive pulmonary emboli, most evident on the left, where emboli extend from the lower lobe pulmonary artery into segmental branches. Small nonocclusive right upper lobe pulmonary embolism. 2. No other evidence of an acute abnormality.  # ?  Buerger's disease [Dr.Pandit; rheumatology [facility]; Dr.Dew-currently non-smoker] # 2011-prothrombin gene mutation/factor V Leiden normal. # COVID-19 [Oct 2020];   # UC s/p pancolectomy [as Teenager]; gastric sleeve surgery-2017.   Oncology History   No history exists.   Chief Complaint:  recurrent VTE-     History of present illness:[name] 57 y.o.  female with history of recurrent VTE and erythrocytosis  Observation/objective: Alert & oriented x 3. In No acute distress.   Patient denies any dyspnea or cough. Patient compliant with her anticoagulation with eliquis.    Assessment and plan: Acute pulmonary embolism without acute cor pulmonale [facility]) # 10th FEB 2024- Bilateral nonocclusive pulmonary emboli, most evident on the  left, where emboli extend from the lower lobe pulmonary artery into segmental branches. Small nonocclusive right upper lobe pulmonary embolism. FEB 10th, 2024- Occlusive DVT within the RIGHT distal femoral, popliteal and proximal posterior tibial veins.  Currently on Eliquis 2.5 mg twice daily. Stable.   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  Previous hypercoagulable workup 2022-negative for any obvious cause. Continue 2.5 mg BID for now. Discussed with Dr.Dew.   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  NOV 2024-EPO level JAK2 mutation- NEGATIVE. [especially given history of blood clots]; would not recommend a bone marrow biopsy at this time. Recommend prn Blood donation  # ? Burger's disease involving upper extremities-currently asymptomatic [quit smoking]; on Pletal plus asprin- ? Worsened Covid-. Stable.   # DISPOSITION: # as needed- Dr.B  Follow-up instructions:  I discussed the assessment and treatment plan with the patient.  The patient was provided an opportunity to ask questions and all were answered.  The patient agreed with the plan and demonstrated understanding of instructions.  The patient was advised to call back or seek an in person evaluation if the symptoms worsen or if the condition fails to improve as anticipated.   Dr. Louretta Shorten [facility] 11/09/2023 8:56 AM

## 2023-11-18 ENCOUNTER — Other Ambulatory Visit (INDEPENDENT_AMBULATORY_CARE_PROVIDER_SITE_OTHER): Payer: Self-pay | Admitting: Nurse Practitioner

## 2023-11-18 ENCOUNTER — Other Ambulatory Visit (INDEPENDENT_AMBULATORY_CARE_PROVIDER_SITE_OTHER): Payer: Self-pay

## 2023-11-18 ENCOUNTER — Encounter: Payer: Self-pay | Admitting: Internal Medicine

## 2023-11-18 ENCOUNTER — Encounter (INDEPENDENT_AMBULATORY_CARE_PROVIDER_SITE_OTHER): Payer: Self-pay | Admitting: Vascular Surgery

## 2023-11-18 MED ORDER — APIXABAN 2.5 MG PO TABS: 2.5000 mg | ORAL_TABLET | Freq: Two times a day (BID) | ORAL | 3 refills | Status: DC

## 2023-11-22 ENCOUNTER — Other Ambulatory Visit (INDEPENDENT_AMBULATORY_CARE_PROVIDER_SITE_OTHER): Payer: Self-pay | Admitting: Nurse Practitioner

## 2023-11-22 MED ORDER — APIXABAN 5 MG PO TABS: 5.0000 mg | ORAL_TABLET | Freq: Two times a day (BID) | ORAL | 3 refills | Status: DC

## 2023-12-20 ENCOUNTER — Ambulatory Visit: Admission: RE | Admit: 2023-12-20 | Payer: 59 | Source: Home / Self Care | Admitting: Internal Medicine

## 2023-12-20 HISTORY — DX: Ulcerative (chronic) pancolitis without complications: K51.00

## 2023-12-20 HISTORY — DX: Sleep apnea, unspecified: G47.30

## 2023-12-20 HISTORY — DX: Infarction of spleen: D73.5

## 2023-12-20 HISTORY — DX: Other abnormalities of gait and mobility: R26.89

## 2023-12-20 HISTORY — DX: Peripheral vascular disease, unspecified: I73.9

## 2023-12-20 HISTORY — DX: Vitamin D deficiency, unspecified: E55.9

## 2023-12-20 HISTORY — DX: Dyspnea, unspecified: R06.00

## 2023-12-20 SURGERY — ESOPHAGOGASTRODUODENOSCOPY (EGD) WITH PROPOFOL
Anesthesia: General

## 2023-12-26 ENCOUNTER — Encounter: Payer: Self-pay | Admitting: Internal Medicine

## 2023-12-26 ENCOUNTER — Other Ambulatory Visit
Admission: RE | Admit: 2023-12-26 | Discharge: 2023-12-26 | Disposition: A | Discharge status: Home / Self Care | Payer: 59 | Source: Ambulatory Visit | Attending: Internal Medicine | Admitting: Internal Medicine

## 2023-12-26 DIAGNOSIS — E039 Hypothyroidism, unspecified: Secondary | ICD-10-CM | POA: Insufficient documentation

## 2023-12-26 LAB — T4, FREE: Free T4: 0.79 ng/dL (ref 0.61–1.12)

## 2023-12-26 LAB — TSH: TSH: 41.307 u[IU]/mL — ABNORMAL HIGH (ref 0.350–4.500)

## 2023-12-28 ENCOUNTER — Other Ambulatory Visit: Payer: Self-pay | Admitting: Internal Medicine

## 2023-12-28 DIAGNOSIS — N8110 Cystocele, unspecified: Secondary | ICD-10-CM

## 2024-01-20 ENCOUNTER — Other Ambulatory Visit: Payer: Self-pay | Admitting: Student in an Organized Health Care Education/Training Program

## 2024-01-20 DIAGNOSIS — M150 Primary generalized (osteo)arthritis: Secondary | ICD-10-CM

## 2024-01-23 ENCOUNTER — Other Ambulatory Visit: Payer: Self-pay

## 2024-01-23 ENCOUNTER — Encounter: Payer: Self-pay | Admitting: Internal Medicine

## 2024-01-23 MED ORDER — FAMOTIDINE 20 MG PO TABS: 20.0000 mg | ORAL_TABLET | Freq: Two times a day (BID) | ORAL | 5 refills | Status: DC

## 2024-01-25 ENCOUNTER — Ambulatory Visit: Admission: RE | Admit: 2024-01-25 | Payer: 59 | Source: Ambulatory Visit

## 2024-01-27 ENCOUNTER — Ambulatory Visit
Admission: RE | Admit: 2024-01-27 | Discharge: 2024-01-27 | Disposition: A | Discharge status: Home / Self Care | Payer: 59 | Source: Ambulatory Visit | Attending: Student in an Organized Health Care Education/Training Program | Admitting: Student in an Organized Health Care Education/Training Program

## 2024-01-27 ENCOUNTER — Other Ambulatory Visit: Payer: Self-pay

## 2024-01-27 ENCOUNTER — Ambulatory Visit: Admission: RE | Admit: 2024-01-27 | Payer: 59 | Source: Ambulatory Visit

## 2024-01-27 DIAGNOSIS — M150 Primary generalized (osteo)arthritis: Secondary | ICD-10-CM | POA: Diagnosis present

## 2024-01-28 ENCOUNTER — Ambulatory Visit: Payer: 59

## 2024-02-06 ENCOUNTER — Other Ambulatory Visit: Payer: Self-pay | Admitting: Internal Medicine

## 2024-02-06 DIAGNOSIS — R748 Abnormal levels of other serum enzymes: Secondary | ICD-10-CM

## 2024-02-06 DIAGNOSIS — K1120 Sialoadenitis, unspecified: Secondary | ICD-10-CM

## 2024-02-08 ENCOUNTER — Ambulatory Visit: Admission: RE | Admit: 2024-02-08 | Payer: 59 | Source: Ambulatory Visit

## 2024-02-08 ENCOUNTER — Other Ambulatory Visit: Payer: Self-pay

## 2024-02-08 ENCOUNTER — Emergency Department: Payer: 59

## 2024-02-08 ENCOUNTER — Emergency Department
Admission: EM | Admit: 2024-02-08 | Discharge: 2024-02-08 | Disposition: A | Discharge status: Home / Self Care | Payer: 59 | Attending: Emergency Medicine | Admitting: Emergency Medicine

## 2024-02-08 DIAGNOSIS — Z7901 Long term (current) use of anticoagulants: Secondary | ICD-10-CM | POA: Diagnosis not present

## 2024-02-08 DIAGNOSIS — I10 Essential (primary) hypertension: Secondary | ICD-10-CM | POA: Diagnosis not present

## 2024-02-08 DIAGNOSIS — R079 Chest pain, unspecified: Secondary | ICD-10-CM | POA: Diagnosis present

## 2024-02-08 DIAGNOSIS — Z79899 Other long term (current) drug therapy: Secondary | ICD-10-CM | POA: Insufficient documentation

## 2024-02-08 DIAGNOSIS — E039 Hypothyroidism, unspecified: Secondary | ICD-10-CM | POA: Insufficient documentation

## 2024-02-08 LAB — URINALYSIS, ROUTINE W REFLEX MICROSCOPIC
Bacteria, UA: NONE SEEN
Bilirubin Urine: NEGATIVE
Glucose, UA: NEGATIVE mg/dL
Ketones, ur: 5 mg/dL — AB
Nitrite: NEGATIVE
Protein, ur: NEGATIVE mg/dL
Specific Gravity, Urine: 1.006 (ref 1.005–1.030)
pH: 5 (ref 5.0–8.0)

## 2024-02-08 LAB — CBC
HCT: 45.3 % (ref 36.0–46.0)
Hemoglobin: 15 g/dL (ref 12.0–15.0)
MCH: 31.4 pg (ref 26.0–34.0)
MCHC: 33.1 g/dL (ref 30.0–36.0)
MCV: 94.8 fL (ref 80.0–100.0)
Platelets: 173 10*3/uL (ref 150–400)
RBC: 4.78 MIL/uL (ref 3.87–5.11)
RDW: 13.7 % (ref 11.5–15.5)
WBC: 7.9 10*3/uL (ref 4.0–10.5)
nRBC: 0 % (ref 0.0–0.2)

## 2024-02-08 LAB — BASIC METABOLIC PANEL
Anion gap: 15 (ref 5–15)
BUN: 14 mg/dL (ref 6–20)
CO2: 16 mmol/L — ABNORMAL LOW (ref 22–32)
Calcium: 9 mg/dL (ref 8.9–10.3)
Chloride: 105 mmol/L (ref 98–111)
Creatinine, Ser: 0.75 mg/dL (ref 0.44–1.00)
GFR, Estimated: >60 mL/min (ref >60)
Glucose, Bld: 97 mg/dL (ref 70–99)
Potassium: 3.9 mmol/L (ref 3.5–5.1)
Sodium: 136 mmol/L (ref 135–145)

## 2024-02-08 LAB — TROPONIN I (HIGH SENSITIVITY)
Troponin I (High Sensitivity): 5 ng/L (ref <18)
Troponin I (High Sensitivity): 6 ng/L (ref <18)

## 2024-02-08 LAB — RESP PANEL BY RT-PCR (RSV, FLU A&B, COVID)  RVPGX2
Influenza A by PCR: NEGATIVE
Influenza B by PCR: NEGATIVE
Resp Syncytial Virus by PCR: NEGATIVE
SARS Coronavirus 2 by RT PCR: NEGATIVE

## 2024-02-08 LAB — AMYLASE: Amylase: 701 U/L — ABNORMAL HIGH (ref 28–100)

## 2024-02-08 LAB — MAGNESIUM: Magnesium: 1.7 mg/dL (ref 1.7–2.4)

## 2024-02-08 LAB — HCG, QUANTITATIVE, PREGNANCY: hCG, Beta Chain, Quant, S: 2 m[IU]/mL (ref <5)

## 2024-02-08 LAB — T4, FREE: Free T4: 1.67 ng/dL — ABNORMAL HIGH (ref 0.61–1.12)

## 2024-02-08 LAB — TSH: TSH: 0.276 u[IU]/mL — ABNORMAL LOW (ref 0.350–4.500)

## 2024-02-08 MED ORDER — IOHEXOL 350 MG/ML SOLN
100.0000 mL | Freq: Once | INTRAVENOUS | Status: AC | PRN
  Administered 2024-02-08: 100 mL via INTRAVENOUS

## 2024-02-08 MED ORDER — SODIUM CHLORIDE 0.9 % IV BOLUS (SEPSIS)
1000.0000 mL | Freq: Once | INTRAVENOUS | Status: AC
  Administered 2024-02-08: 1000 mL via INTRAVENOUS

## 2024-02-08 NOTE — ED Provider Notes (Signed)
 [facility] Provider Note    Event Date/Time   First MD Initiated Contact with Patient 02/08/24 [phone]     (approximate)   History   Chest Pain   HPI  [name] is a 58 y.o. female with history of hypertension, ulcerative colitis, hypothyroidism, Buerger's disease, PE and DVT on Eliquis who presents to the emergency department with complaints of intermittent left-sided chest tight that radiated into the shoulder, shortness of breath, rapid heart rate, elevated blood pressure.  States symptoms are intermittent.  No aggravating or alleviating factors.  No fevers, cough.  No lower extremity swelling or pain.  Reports compliance with her Eliquis.  Patient is on a keto diet currently.  She states her ketones were elevated when she checked tonight which concerned her.  She also states that she is supposed to have routine blood work today to check her amylase level and is requesting that we draw this today so that she does not have to come back to the hospital for a second visit.  She also reports she is supposed to have a CT of the soft tissues of her neck with contrast today and is asking if this can be done while she is in the ED as well.  This was ordered by her ENT physician.   History provided by patient, family.    Past Medical History:  Diagnosis Date   Berger's disease 2011   Buerger's disease (HCC)    Decreased functional mobility    Dyspnea    Family history of adverse reaction to anesthesia    sister and daughter difficult to wake up   Folic acid deficiency    GERD (gastroesophageal reflux disease)    Hypertension    Hypothyroidism    IDA (iron deficiency anemia)    Osteoarthritis    Peripheral vascular disease (HCC)    PVC's (premature ventricular contractions)    Sleep apnea    Splenic infarct    Thrombocytosis    Ulcerative (chronic) enterocolitis (HCC)    Vitamin D deficiency     Past Surgical History:  Procedure Laterality Date    ADENOIDECTOMY     Small child   APPENDECTOMY     Age 89   COLON SURGERY  1980   ileostomy and internal pouch age 54   CORONARY ARTERY BYPASS GRAFT     CORONARY/GRAFT ACUTE MI REVASCULARIZATION N/A 06/12/2021   Procedure: Coronary/Graft Acute MI Revascularization;  Surgeon: Marcina Millard, MD;  Location: [facility] INVASIVE CV LAB;  Service: Cardiovascular;  Laterality: N/A;   DILATATION & CURETTAGE/HYSTEROSCOPY WITH MYOSURE N/A 05/02/2015   Procedure: DILATATION & CURETTAGE/HYSTEROSCOPY WITH MYOSURE;  Surgeon: Vena Austria, MD;  Location: [facility] ORS;  Service: Gynecology;  Laterality: N/A;   DILATION AND CURETTAGE OF UTERUS     LAPAROSCOPIC GASTRIC SLEEVE RESECTION  07/06/2016   LEFT HEART CATH AND CORONARY ANGIOGRAPHY N/A 06/12/2021   Procedure: LEFT HEART CATH AND CORONARY ANGIOGRAPHY;  Surgeon: Marcina Millard, MD;  Location: [facility] INVASIVE CV LAB;  Service: Cardiovascular;  Laterality: N/A;    MEDICATIONS:  Prior to Admission medications   Medication Sig Start Date End Date Taking? Authorizing Provider  apixaban (ELIQUIS) 5 MG TABS tablet Take 1 tablet (5 mg total) by mouth 2 (two) times daily. 11/22/23   Georgiana Spinner, NP  Cholecalciferol 50 MCG (2000 UT) CAPS Take by mouth.    [provider]  Cranberry 1000 MG CAPS Take 1 tablet by mouth 2 (two) times daily.  [provider]  famotidine (PEPCID) 20 MG tablet Take 1 tablet (20 mg total) by mouth 2 (two) times daily. 01/23/24   Lyndon Code, MD  folic acid (FOLVITE) 1 MG tablet TAKE 1 TABLET BY MOUTH EVERY DAY 07/25/23   Lyndon Code, MD  ivermectin (STROMECTOL) 3 MG TABS tablet Take 24 mg by mouth daily.    [provider]  levothyroxine (SYNTHROID) 137 MCG tablet Take 125 mcg by mouth daily before breakfast.    [provider]  MAGNESIUM CHLORIDE PO Take 143 mg by mouth. Two tablets daily    [provider]  NON FORMULARY Take 1 tablet by mouth daily. Shaklee Mens daily  vitamin. NO IRON    [provider]  potassium chloride (KLOR-CON M) 10 MEQ tablet TAKE 1 TABLET BY MOUTH DAILY (TAKE 2 ON WEDNESDAY) 10/11/23   Lyndon Code, MD  potassium chloride (MICRO-K) 10 MEQ CR capsule Take 10 mEq by mouth daily. Takes two on weds    [provider]  Probiotic Product (PROBIOTIC ADVANCED PO) Take 1 capsule by mouth daily.    [provider]    Physical Exam   Triage Vital Signs: ED Triage Vitals  Encounter Vitals Group     BP 02/08/24 0446 (!) 154/82     Systolic BP Percentile --      Diastolic BP Percentile --      Pulse Rate 02/08/24 0446 97     Resp 02/08/24 0446 18     Temp 02/08/24 0446 98.5 F (36.9 C)     Temp Source 02/08/24 0446 Oral     SpO2 02/08/24 0446 100 %     Weight 02/08/24 0445 (!) 326 lb (147.9 kg)     Height 02/08/24 0445 5\' 7"  (1.702 m)     Head Circumference --      Peak Flow --      Pain Score 02/08/24 0445 2     Pain Loc --      Pain Education --      Exclude from Growth Chart --     Most recent vital signs: Vitals:   02/08/24 0446  BP: (!) 154/82  Pulse: 97  Resp: 18  Temp: 98.5 F (36.9 C)  SpO2: 100%    CONSTITUTIONAL: Alert, responds appropriately to questions. Well-appearing; well-nourished, extremely pleasant HEAD: Normocephalic, atraumatic EYES: Conjunctivae clear, pupils appear equal, sclera nonicteric ENT: normal nose; moist mucous membranes, no trismus or drooling, normal phonation NECK: Supple, normal ROM, trachea midline, no soft tissue swelling CARD: RRR; S1 and S2 appreciated RESP: Normal chest excursion without splinting or tachypnea; breath sounds clear and equal bilaterally; no wheezes, no rhonchi, no rales, no hypoxia or respiratory distress, speaking full sentences ABD/GI: Non-distended; soft, non-tender, no rebound, no guarding, no peritoneal signs BACK: The back appears normal EXT: Normal ROM in all joints; no deformity noted, no edema, no calf tenderness or calf  swelling SKIN: Normal color for age and race; warm; no rash on exposed skin NEURO: Moves all extremities equally, normal speech PSYCH: The patient's mood and manner are appropriate.   ED Results / Procedures / Treatments   LABS: (all labs ordered are listed, but only abnormal results are displayed) Labs Reviewed  BASIC METABOLIC PANEL - Abnormal; Notable for the following components:      Result Value   CO2 16 (*)    All other components within normal limits  RESP PANEL BY RT-PCR (RSV, FLU A&B, COVID)  RVPGX2  CBC  MAGNESIUM  TSH  AMYLASE  HCG, QUANTITATIVE, PREGNANCY  T4, FREE  TROPONIN I (HIGH SENSITIVITY)  TROPONIN I (HIGH SENSITIVITY)     EKG:  EKG Interpretation Date/Time:  Wednesday February 08 2024 04:42:00 EST Ventricular Rate:  104 PR Interval:  126 QRS Duration:  82 QT Interval:  342 QTC Calculation: 449 R Axis:   63  Text Interpretation: Sinus tachycardia Otherwise normal ECG When compared with ECG of 05-Mar-2023 11:43, No significant change was found Confirmed by Rochele Raring [phone]) on 02/08/2024 5:49:21 AM         RADIOLOGY: My personal review and interpretation of imaging: Chest x-ray concerning for left lower lobe pneumonia.  I have personally reviewed all radiology reports.   DG Chest 2 View Result Date: 02/08/2024 CLINICAL DATA:  Chest pain. EXAM: CHEST - 2 VIEW COMPARISON:  Chest x-ray 03/05/2023, CT chest 01/22/2023 FINDINGS: Limited evaluation due to overlapping osseous structures and overlying soft tissues. The heart and mediastinal contours are unchanged. Question left base airspace opacity. No pulmonary edema. No pleural effusion. No pneumothorax. No acute osseous abnormality. IMPRESSION: Question left base airspace opacity. Limited evaluation due to overlapping osseous structures and overlying soft tissues. Electronically Signed   By: Tish Frederickson M.D.   On: 02/08/2024 05:50     PROCEDURES:  Critical Care performed: No    .1-3  Lead EKG Interpretation  Performed by: Merrit Friesen, Layla Maw, DO Authorized by: Sharif Rendell, Layla Maw, DO     Interpretation: normal     ECG rate:  97   ECG rate assessment: normal     Rhythm: sinus rhythm     Ectopy: none     Conduction: normal       IMPRESSION / MDM / ASSESSMENT AND PLAN / ED COURSE  I reviewed the triage vital signs and the nursing notes.    Patient here with chest pain, shortness of breath, elevated heart rate and blood pressure.  The patient is on the cardiac monitor to evaluate for evidence of arrhythmia and/or significant heart rate changes.   DIFFERENTIAL DIAGNOSIS (includes but not limited to):   ACS, PE, electrolyte derangement, dehydration, anemia, less likely dissection, pneumonia, pneumothorax, CHF   Patient's presentation is most consistent with acute presentation with potential threat to life or bodily function.   PLAN: EKG is nonischemic.  Patient does have a bicarb of 16 with normal anion gap.  Suspect this is due to ketosis from strict low-carb diet.  Will give IV fluids.  First troponin negative.  Second pending.  Chest x-ray reviewed and interpreted by myself and the radiologist and is questioning a left lower lobe pneumonia.  Will obtain CTA of the chest to further evaluate but also to rule out PE given her history.  Patient is concerned because she is post have a CT of the soft tissues of her neck today as well.  Discussed with the radiology technician who states that they do have a protocol where they can do both scans with just 1 contrast load.  Will go ahead and order this CT scan now and it can be followed up by her ENT physician as an outpatient.   MEDICATIONS GIVEN IN ED: Medications  sodium chloride 0.9 % bolus 1,000 mL (1,000 mLs Intravenous New Bag/Given 02/08/24 0628)  iohexol (OMNIPAQUE) 350 MG/ML injection 100 mL (100 mLs Intravenous Contrast Given 02/08/24 0642)     ED COURSE: Second troponin, thyroid function studies, magnesium, CTA of  the chest pending.  Signed out the  oncoming ED physician.  Amylase level and CT of the soft tissues of the neck ordered as routine studies.  These test results can be followed up by her ENT as an outpatient.   CONSULTS: Dispo pending further workup.   OUTSIDE RECORDS REVIEWED: Reviewed recent ENT notes.       FINAL CLINICAL IMPRESSION(S) / ED DIAGNOSES   Final diagnoses:  Nonspecific chest pain     Rx / DC Orders   ED Discharge Orders     None        Note:  This document was prepared using Dragon voice recognition software and may include unintentional dictation errors.   Kacia Halley, Layla Maw, DO 02/08/24 [phone]

## 2024-02-08 NOTE — ED Triage Notes (Signed)
 Pt reports chest pain that began at 0230 this morning, pain is ache in center of chest that radiates into her back. Pt reports some shortness pf breath and noticed she had inc in heart rate.

## 2024-02-08 NOTE — ED Provider Notes (Signed)
 Care of this patient assumed from prior physician at 0700 pending CT imaging, completion of labs, and disposition. Please see prior physician note for further details.  Briefly this is a 58 year old female who presented with chest pain and rapid heart rate.  Initial labs notable for low CO2 at 16, but normal anion gap and glucose.  Amylase remains elevated, but not significantly changed from prior.  Second troponin returned . within normal limits.  TSH low at 0.276, free T4 high at 1.67.  CT a of her chest without evidence of PE or pneumonia.  CT soft tissue neck without abnormality of salivary gland.  Patient reassessed and updated on results of workup.  Did discuss that her elevated thyroid levels may be contributing to some of her symptoms and did recommend close follow-up with her primary care doctor discuss any needed medication adjustments.  Patient is comfortable with discharge home.  Strict return precautions provided.  Patient discharged stable condition.   Trinna Post, MD 02/08/24 [phone]

## 2024-02-08 NOTE — Discharge Instructions (Addendum)
 You were seen in the Emergency Department today for evaluation of your chest pain. Fortunately, your labs, EKG, and chest x-[name] were overall reassuring against a emergency cause for your pain.  As we discussed, your thyroid levels were abnormal today.  Please discuss this with your primary care doctor to see if they recommend any medication adjustments.  Return to the ER for any new or worsening symptoms including worsening chest pain, difficulty breathing, or any other new or concerning symptoms that you believe warrants immediate attention.   Labs 02/08/24:  TSH: 0.276 Free T4: 1.67

## 2024-02-22 ENCOUNTER — Other Ambulatory Visit: Payer: Self-pay | Admitting: Student in an Organized Health Care Education/Training Program

## 2024-02-22 DIAGNOSIS — G8929 Other chronic pain: Secondary | ICD-10-CM

## 2024-02-22 DIAGNOSIS — M150 Primary generalized (osteo)arthritis: Secondary | ICD-10-CM

## 2024-02-23 ENCOUNTER — Telehealth (INDEPENDENT_AMBULATORY_CARE_PROVIDER_SITE_OTHER): Payer: Self-pay | Admitting: Vascular Surgery

## 2024-02-23 NOTE — Telephone Encounter (Signed)
 Margerie from [facility] called stating they sent over a fax and Dr Gilda Crease signed off and sent fax back over but did not specify how many days pt needs to hold eliquis prior to her upper endoscopy she's having on 03/07/24.

## 2024-02-23 NOTE — Telephone Encounter (Signed)
 Fax has been re sent

## 2024-02-28 ENCOUNTER — Ambulatory Visit
Admission: RE | Admit: 2024-02-28 | Discharge: 2024-02-28 | Disposition: A | Discharge status: Home / Self Care | Source: Ambulatory Visit | Attending: Student in an Organized Health Care Education/Training Program | Admitting: Student in an Organized Health Care Education/Training Program

## 2024-02-28 ENCOUNTER — Other Ambulatory Visit
Admission: RE | Admit: 2024-02-28 | Discharge: 2024-02-28 | Disposition: A | Discharge status: Home / Self Care | Source: Ambulatory Visit | Attending: Obstetrics and Gynecology | Admitting: Obstetrics and Gynecology

## 2024-02-28 ENCOUNTER — Ambulatory Visit
Admission: RE | Admit: 2024-02-28 | Discharge: 2024-02-28 | Disposition: A | Discharge status: Home / Self Care | Payer: 59 | Source: Ambulatory Visit | Attending: Obstetrics and Gynecology | Admitting: Obstetrics and Gynecology

## 2024-02-28 ENCOUNTER — Ambulatory Visit: Admission: RE | Admit: 2024-02-28 | Source: Ambulatory Visit

## 2024-02-28 DIAGNOSIS — G8929 Other chronic pain: Secondary | ICD-10-CM

## 2024-02-28 DIAGNOSIS — M25561 Pain in right knee: Secondary | ICD-10-CM | POA: Insufficient documentation

## 2024-02-28 DIAGNOSIS — M150 Primary generalized (osteo)arthritis: Secondary | ICD-10-CM | POA: Insufficient documentation

## 2024-02-28 DIAGNOSIS — Z1231 Encounter for screening mammogram for malignant neoplasm of breast: Secondary | ICD-10-CM | POA: Diagnosis present

## 2024-03-01 ENCOUNTER — Encounter: Payer: Self-pay | Admitting: Obstetrics and Gynecology

## 2024-03-02 ENCOUNTER — Other Ambulatory Visit
Admission: RE | Admit: 2024-03-02 | Discharge: 2024-03-02 | Disposition: A | Discharge status: Home / Self Care | Source: Home / Self Care | Attending: Student in an Organized Health Care Education/Training Program | Admitting: Student in an Organized Health Care Education/Training Program

## 2024-03-02 ENCOUNTER — Other Ambulatory Visit
Admission: RE | Admit: 2024-03-02 | Discharge: 2024-03-02 | Disposition: A | Discharge status: Home / Self Care | Attending: Internal Medicine | Admitting: Internal Medicine

## 2024-03-02 DIAGNOSIS — K90829 Short bowel syndrome, unspecified: Secondary | ICD-10-CM | POA: Insufficient documentation

## 2024-03-02 DIAGNOSIS — R748 Abnormal levels of other serum enzymes: Secondary | ICD-10-CM | POA: Insufficient documentation

## 2024-03-02 DIAGNOSIS — M150 Primary generalized (osteo)arthritis: Secondary | ICD-10-CM | POA: Diagnosis not present

## 2024-03-02 LAB — CBC WITH DIFFERENTIAL/PLATELET
Abs Immature Granulocytes: 0.03 10*3/uL (ref 0.00–0.07)
Basophils Absolute: 0 10*3/uL (ref 0.0–0.1)
Basophils Relative: 0 %
Eosinophils Absolute: 0.1 10*3/uL (ref 0.0–0.5)
Eosinophils Relative: 1 %
HCT: 44.8 % (ref 36.0–46.0)
Hemoglobin: 14.9 g/dL (ref 12.0–15.0)
Immature Granulocytes: 0 %
Lymphocytes Relative: 36 %
Lymphs Abs: 2.7 10*3/uL (ref 0.7–4.0)
MCH: 31.4 pg (ref 26.0–34.0)
MCHC: 33.3 g/dL (ref 30.0–36.0)
MCV: 94.3 fL (ref 80.0–100.0)
Monocytes Absolute: 0.7 10*3/uL (ref 0.1–1.0)
Monocytes Relative: 10 %
Neutro Abs: 4 10*3/uL (ref 1.7–7.7)
Neutrophils Relative %: 53 %
Platelets: 220 10*3/uL (ref 150–400)
RBC: 4.75 MIL/uL (ref 3.87–5.11)
RDW: 14.2 % (ref 11.5–15.5)
WBC: 7.6 10*3/uL (ref 4.0–10.5)
nRBC: 0.3 % — ABNORMAL HIGH (ref 0.0–0.2)

## 2024-03-02 LAB — COMPREHENSIVE METABOLIC PANEL
ALT: 24 U/L (ref 0–44)
AST: 19 U/L (ref 15–41)
Albumin: 3.8 g/dL (ref 3.5–5.0)
Alkaline Phosphatase: 63 U/L (ref 38–126)
Anion gap: 12 (ref 5–15)
BUN: 9 mg/dL (ref 6–20)
CO2: 22 mmol/L (ref 22–32)
Calcium: 9.2 mg/dL (ref 8.9–10.3)
Chloride: 102 mmol/L (ref 98–111)
Creatinine, Ser: 0.63 mg/dL (ref 0.44–1.00)
GFR, Estimated: >60 mL/min (ref >60)
Glucose, Bld: 87 mg/dL (ref 70–99)
Potassium: 3.7 mmol/L (ref 3.5–5.1)
Sodium: 136 mmol/L (ref 135–145)
Total Bilirubin: 1.2 mg/dL (ref 0.0–1.2)
Total Protein: 7.4 g/dL (ref 6.5–8.1)

## 2024-03-02 LAB — VITAMIN B12: Vitamin B-12: 786 pg/mL (ref 180–914)

## 2024-03-02 LAB — IRON AND TIBC
Iron: 93 ug/dL (ref 28–170)
Saturation Ratios: 24 % (ref 10.4–31.8)
TIBC: 392 ug/dL (ref 250–450)
UIBC: 299 ug/dL

## 2024-03-02 LAB — VITAMIN D 25 HYDROXY (VIT D DEFICIENCY, FRACTURES): Vit D, 25-Hydroxy: 77.54 ng/mL (ref 30–100)

## 2024-03-02 LAB — MAGNESIUM: Magnesium: 1.8 mg/dL (ref 1.7–2.4)

## 2024-03-02 LAB — LIPASE, BLOOD: Lipase: 33 U/L (ref 11–51)

## 2024-03-02 LAB — FERRITIN: Ferritin: 141 ng/mL (ref 11–307)

## 2024-03-02 LAB — FOLATE: Folate: >40 ng/mL (ref >5.9)

## 2024-03-02 LAB — AMYLASE: Amylase: 669 U/L — ABNORMAL HIGH (ref 28–100)

## 2024-03-05 LAB — MISC LABCORP TEST (SEND OUT)
Labcorp test code: 123111
Labcorp test code: 716910

## 2024-03-06 LAB — VITAMIN B1: Vitamin B1 (Thiamine): 120.2 nmol/L (ref 66.5–200.0)

## 2024-03-06 LAB — COPPER, SERUM: Copper: 86 ug/dL (ref 80–158)

## 2024-03-06 LAB — ZINC: Zinc: 87 ug/dL (ref 44–115)

## 2024-03-07 ENCOUNTER — Ambulatory Visit: Admit: 2024-03-07 | Payer: 59 | Admitting: Internal Medicine

## 2024-03-07 LAB — VITAMIN B6: Vitamin B6: 11.8 ug/L (ref 3.4–65.2)

## 2024-03-07 SURGERY — ESOPHAGOGASTRODUODENOSCOPY (EGD) WITH PROPOFOL
Anesthesia: General

## 2024-03-08 ENCOUNTER — Telehealth: Payer: Self-pay

## 2024-03-08 NOTE — Telephone Encounter (Signed)
 Lmom to see who ordered lab work. Waiting on a return call.

## 2024-03-09 NOTE — Telephone Encounter (Signed)
 Call addressed. See documentation in lab result note.

## 2024-03-09 NOTE — Telephone Encounter (Signed)
 Pt returning call

## 2024-03-10 ENCOUNTER — Other Ambulatory Visit (INDEPENDENT_AMBULATORY_CARE_PROVIDER_SITE_OTHER): Payer: Self-pay | Admitting: Nurse Practitioner

## 2024-03-17 ENCOUNTER — Other Ambulatory Visit (INDEPENDENT_AMBULATORY_CARE_PROVIDER_SITE_OTHER): Payer: Self-pay | Admitting: Nurse Practitioner

## 2024-03-23 ENCOUNTER — Encounter (INDEPENDENT_AMBULATORY_CARE_PROVIDER_SITE_OTHER): Payer: Self-pay

## 2024-03-26 ENCOUNTER — Ambulatory Visit (INDEPENDENT_AMBULATORY_CARE_PROVIDER_SITE_OTHER): Payer: Self-pay | Admitting: Urology

## 2024-03-26 DIAGNOSIS — N1330 Unspecified hydronephrosis: Secondary | ICD-10-CM

## 2024-03-26 DIAGNOSIS — N8110 Cystocele, unspecified: Secondary | ICD-10-CM

## 2024-03-26 LAB — URINALYSIS, COMPLETE
Bilirubin, UA: NEGATIVE
Glucose, UA: NEGATIVE
Nitrite, UA: NEGATIVE
Protein,UA: NEGATIVE
RBC, UA: NEGATIVE
Specific Gravity, UA: >1.03 — ABNORMAL HIGH (ref 1.005–1.030)
Urobilinogen, Ur: 0.2 mg/dL (ref 0.2–1.0)
pH, UA: 5.5 (ref 5.0–7.5)

## 2024-03-26 LAB — MICROSCOPIC EXAMINATION: Epithelial Cells (non renal): >10 /HPF — AB (ref 0–10)

## 2024-03-26 NOTE — Progress Notes (Signed)
 03/26/2024 10:19 AM   Hewitt Shorts 04-03-1966 [id number]  Referring provider: Lyndon Code, MD [address] [place],  [place] [address]  Chief Complaint  Patient presents with   Establish Care    Bladder prolapse, female, acquired     HPI: I was consulted to assess the patient's voiding dysfunction.  She has a change in her flow and that she cannot make it come out faster.  It was a bit of a nonspecific complaint.  She can double and triple void of varying amount.  She voids every 90 minutes to 3 hours at night and does not have ankle edema and does not take a diuretic.  She voids every 2-3 hours during the day.  She is continent unless she tries to hold it too long.  She is generally bedridden and can have urge incontinence she holds it.  She generally quite wear a pad for confidence.  She thinks she can walk about 20 feet but she needs both knees replaced and she is morbidly obese  Patient has not had a recent abdominal or renal ultrasound.  It appears she has a pelvic ultrasound pending and had 1 in August 2024.  No history of kidney stones bladder surgery or bladder infections.  No neurologic issues.   PMH: Past Medical History:  Diagnosis Date   Berger's disease 2011   Buerger's disease (HCC)    Decreased functional mobility    Dyspnea    Family history of adverse reaction to anesthesia    sister and daughter difficult to wake up   Folic acid deficiency    GERD (gastroesophageal reflux disease)    Hypertension    Hypothyroidism    IDA (iron deficiency anemia)    Osteoarthritis    Peripheral vascular disease (HCC)    PVC's (premature ventricular contractions)    Sleep apnea    Splenic infarct    Thrombocytosis    Ulcerative (chronic) enterocolitis (HCC)    Vitamin D deficiency     Surgical History: Past Surgical History:  Procedure Laterality Date   ADENOIDECTOMY     Small child   APPENDECTOMY     Age 16   COLON SURGERY  1980   ileostomy and internal  pouch age 58   CORONARY ARTERY BYPASS GRAFT     CORONARY/GRAFT ACUTE MI REVASCULARIZATION N/A 06/12/2021   Procedure: Coronary/Graft Acute MI Revascularization;  Surgeon: Marcina Millard, MD;  Location: [facility];  Service: Cardiovascular;  Laterality: N/A;   DILATATION & CURETTAGE/HYSTEROSCOPY WITH MYOSURE N/A 05/02/2015   Procedure: DILATATION & CURETTAGE/HYSTEROSCOPY WITH MYOSURE;  Surgeon: Vena Austria, MD;  Location: [facility] ORS;  Service: Gynecology;  Laterality: N/A;   DILATION AND CURETTAGE OF UTERUS     LAPAROSCOPIC GASTRIC SLEEVE RESECTION  07/06/2016   LEFT HEART CATH AND CORONARY ANGIOGRAPHY N/A 06/12/2021   Procedure: LEFT HEART CATH AND CORONARY ANGIOGRAPHY;  Surgeon: Marcina Millard, MD;  Location: [facility];  Service: Cardiovascular;  Laterality: N/A;    Home Medications:  Allergies as of 03/26/2024       Reactions   Tape Rash   Some tapes cause a rash, possible the paper tape.        Medication List        Accurate as of March 26, 2024 10:19 AM. If you have any questions, ask your nurse or doctor.          Cholecalciferol 50 MCG (2000 UT) Caps Take by mouth.   Cranberry 1000  MG Caps Take 1 tablet by mouth 2 (two) times daily.   Eliquis 5 MG Tabs tablet Generic drug: apixaban TAKE 1 TABLET BY MOUTH TWICE A DAY   famotidine 20 MG tablet Commonly known as: PEPCID Take 1 tablet (20 mg total) by mouth 2 (two) times daily.   folic acid 1 MG tablet Commonly known as: FOLVITE TAKE 1 TABLET BY MOUTH EVERY DAY   ivermectin 3 MG Tabs tablet Commonly known as: STROMECTOL Take 24 mg by mouth daily.   levonorgestrel 20 MCG/DAY Iud Commonly known as: MIRENA by Intrauterine route.   levothyroxine 137 MCG tablet Commonly known as: SYNTHROID Take 125 mcg by mouth daily before breakfast.   MAGNESIUM CHLORIDE PO Take 143 mg by mouth. Two tablets daily   NON FORMULARY Take 1 tablet by mouth daily. Shaklee Mens daily  vitamin. NO IRON   potassium chloride 10 MEQ CR capsule Commonly known as: MICRO-K Take 10 mEq by mouth daily. Takes two on weds   potassium chloride 10 MEQ tablet Commonly known as: KLOR-CON M TAKE 1 TABLET BY MOUTH DAILY (TAKE 2 ON WEDNESDAY)   PROBIOTIC ADVANCED PO Take 1 capsule by mouth daily.        Allergies:  Allergies  Allergen Reactions   Tape Rash    Some tapes cause a rash, possible the paper tape.    Family History: Family History  Problem Relation Age of Onset   Clotting disorder Mother    Hypertension Mother    Congestive Heart Failure Father    Hypertension Father    Stroke Sister    Rheum arthritis Sister    Skin cancer Maternal Aunt        Basal cell   Skin cancer Maternal Uncle        Basal cell   Congestive Heart Failure Maternal Grandmother    Colon cancer Maternal Grandfather 85   Skin cancer Cousin        Basal Cell   Breast cancer Neg Hx     Social History:  reports that she quit smoking about 13 years ago. Her smoking use included cigarettes. She has never used smokeless tobacco. She reports that she does not drink alcohol and does not use drugs.  ROS:                                        Physical Exam: There were no vitals taken for this visit.    Laboratory Data: Lab Results  Component Value Date   WBC 7.6 03/02/2024   HGB 14.9 03/02/2024   HCT 44.8 03/02/2024   MCV 94.3 03/02/2024   PLT 220 03/02/2024    Lab Results  Component Value Date   CREATININE 0.63 03/02/2024    No results found for: "PSA"  No results found for: "TESTOSTERONE"  Lab Results  Component Value Date   HGBA1C 5.0 09/15/2023    Urinalysis    Component Value Date/Time   COLORURINE STRAW (A) 02/08/2024 0556   APPEARANCEUR CLEAR (A) 02/08/2024 0556   APPEARANCEUR Clear 09/23/2022 1030   LABSPEC 1.006 02/08/2024 0556   PHURINE 5.0 02/08/2024 0556   GLUCOSEU NEGATIVE 02/08/2024 0556   HGBUR SMALL (A) 02/08/2024 0556    BILIRUBINUR NEGATIVE 02/08/2024 0556   BILIRUBINUR negative 07/20/2023 1349   BILIRUBINUR Negative 09/23/2022 1030   KETONESUR 5 (A) 02/08/2024 0556   PROTEINUR NEGATIVE 02/08/2024 0556   UROBILINOGEN  0.2 07/20/2023 1349   NITRITE NEGATIVE 02/08/2024 0556   LEUKOCYTESUR MODERATE (A) 02/08/2024 0556    Pertinent Imaging: Chart reviewed  Assessment & Plan: Pathophysiology of flow symptoms discussed.  Pathophysiology of nighttime frequency discussed.  I talked about the role of a screening ultrasound.  We decided not to try to do pelvic examination and cystoscopy which would likely almost for certain be normal.  No blood in urine.  Call if culture positive.  Patient was really looking for reassurance and want to make sure Kidneys are healthy.  She did not want to try Flomax.  She will get a renal ultrasound.  If there is any question she would get a CT scan based on body habitus.  Otherwise watchful waiting for nonspecific symptoms follow-up with extender with ultrasound  1. Bladder prolapse, female, acquired (Primary)  - Urinalysis, Complete   No follow-ups on file.  Devorah Fonder, MD  [facility] [address] [place], [place] [address] [phone]

## 2024-03-27 ENCOUNTER — Other Ambulatory Visit: Payer: Self-pay

## 2024-03-27 MED ORDER — POTASSIUM CHLORIDE CRYS ER 10 MEQ PO TBCR: EXTENDED_RELEASE_TABLET | ORAL | 1 refills | Status: DC

## 2024-03-28 ENCOUNTER — Encounter: Payer: Self-pay | Admitting: Urology

## 2024-03-29 LAB — CULTURE, URINE COMPREHENSIVE

## 2024-03-30 ENCOUNTER — Ambulatory Visit
Admission: RE | Admit: 2024-03-30 | Discharge: 2024-03-30 | Disposition: A | Discharge status: Home / Self Care | Source: Ambulatory Visit | Attending: Urology | Admitting: Urology

## 2024-03-30 DIAGNOSIS — N1330 Unspecified hydronephrosis: Secondary | ICD-10-CM | POA: Diagnosis present

## 2024-04-02 ENCOUNTER — Other Ambulatory Visit: Payer: Self-pay

## 2024-04-02 MED ORDER — CEPHALEXIN 500 MG PO CAPS: 500.0000 mg | ORAL_CAPSULE | Freq: Three times a day (TID) | ORAL | 0 refills | Status: AC

## 2024-04-02 NOTE — Telephone Encounter (Signed)
 Patient advised that message earlier was in regards to her urine culture and abx was sent in.

## 2024-05-01 ENCOUNTER — Encounter (INDEPENDENT_AMBULATORY_CARE_PROVIDER_SITE_OTHER): Payer: Self-pay

## 2024-05-01 ENCOUNTER — Encounter: Payer: Self-pay | Admitting: Urology

## 2024-05-08 ENCOUNTER — Ambulatory Visit: Admitting: Physician Assistant

## 2024-05-09 ENCOUNTER — Other Ambulatory Visit
Admission: RE | Admit: 2024-05-09 | Discharge: 2024-05-09 | Disposition: A | Discharge status: Home / Self Care | Attending: Internal Medicine | Admitting: Internal Medicine

## 2024-05-09 DIAGNOSIS — Z6841 Body Mass Index (BMI) 40.0 and over, adult: Secondary | ICD-10-CM | POA: Diagnosis not present

## 2024-05-09 DIAGNOSIS — E039 Hypothyroidism, unspecified: Secondary | ICD-10-CM | POA: Insufficient documentation

## 2024-05-09 LAB — HEPATIC FUNCTION PANEL
ALT: 21 U/L (ref 0–44)
AST: 22 U/L (ref 15–41)
Albumin: 4 g/dL (ref 3.5–5.0)
Alkaline Phosphatase: 72 U/L (ref 38–126)
Bilirubin, Direct: 0.1 mg/dL (ref 0.0–0.2)
Indirect Bilirubin: 1.3 mg/dL — ABNORMAL HIGH (ref 0.3–0.9)
Total Bilirubin: 1.4 mg/dL — ABNORMAL HIGH (ref 0.0–1.2)
Total Protein: 7.9 g/dL (ref 6.5–8.1)

## 2024-05-09 LAB — LIPID PANEL
Cholesterol: 114 mg/dL (ref 0–200)
HDL: 45 mg/dL (ref >40)
LDL Cholesterol: 59 mg/dL (ref 0–99)
Total CHOL/HDL Ratio: 2.5 ratio
Triglycerides: 48 mg/dL (ref <150)
VLDL: 10 mg/dL (ref 0–40)

## 2024-05-09 LAB — HEMOGLOBIN A1C
Hgb A1c MFr Bld: 4.4 % — ABNORMAL LOW (ref 4.8–5.6)
Mean Plasma Glucose: 79.58 mg/dL

## 2024-05-09 LAB — TSH: TSH: 0.17 u[IU]/mL — ABNORMAL LOW (ref 0.350–4.500)

## 2024-06-05 ENCOUNTER — Other Ambulatory Visit (INDEPENDENT_AMBULATORY_CARE_PROVIDER_SITE_OTHER): Payer: Self-pay

## 2024-06-05 MED ORDER — APIXABAN 5 MG PO TABS: 5.0000 mg | ORAL_TABLET | Freq: Two times a day (BID) | ORAL | 3 refills | Status: DC

## 2024-06-07 ENCOUNTER — Other Ambulatory Visit
Admission: RE | Admit: 2024-06-07 | Discharge: 2024-06-07 | Disposition: A | Discharge status: Home / Self Care | Source: Ambulatory Visit | Attending: Internal Medicine | Admitting: Internal Medicine

## 2024-06-07 DIAGNOSIS — E039 Hypothyroidism, unspecified: Secondary | ICD-10-CM | POA: Insufficient documentation

## 2024-06-07 DIAGNOSIS — Z6841 Body Mass Index (BMI) 40.0 and over, adult: Secondary | ICD-10-CM | POA: Insufficient documentation

## 2024-06-07 LAB — LIPID PANEL
Cholesterol: 132 mg/dL (ref 0–200)
HDL: 47 mg/dL (ref >40)
LDL Cholesterol: 74 mg/dL (ref 0–99)
Total CHOL/HDL Ratio: 2.8 ratio
Triglycerides: 54 mg/dL (ref <150)
VLDL: 11 mg/dL (ref 0–40)

## 2024-06-07 LAB — HEPATIC FUNCTION PANEL
ALT: 28 U/L (ref 0–44)
AST: 26 U/L (ref 15–41)
Albumin: 3.5 g/dL (ref 3.5–5.0)
Alkaline Phosphatase: 59 U/L (ref 38–126)
Bilirubin, Direct: <0.1 mg/dL (ref 0.0–0.2)
Total Bilirubin: 0.8 mg/dL (ref 0.0–1.2)
Total Protein: 7.1 g/dL (ref 6.5–8.1)

## 2024-06-07 LAB — TSH: TSH: 0.035 u[IU]/mL — ABNORMAL LOW (ref 0.350–4.500)

## 2024-07-10 ENCOUNTER — Encounter: Payer: Self-pay | Admitting: Internal Medicine

## 2024-07-10 NOTE — Telephone Encounter (Signed)
 Lmom that we don't have any opening  this week and she need call office and make appt when available and also need face to face visit other option she need buy

## 2024-07-12 ENCOUNTER — Other Ambulatory Visit: Payer: Self-pay | Admitting: Internal Medicine

## 2024-07-26 ENCOUNTER — Other Ambulatory Visit: Payer: Self-pay | Admitting: Internal Medicine

## 2024-07-26 DIAGNOSIS — E538 Deficiency of other specified B group vitamins: Secondary | ICD-10-CM

## 2024-08-22 DIAGNOSIS — J449 Chronic obstructive pulmonary disease, unspecified: Secondary | ICD-10-CM | POA: Diagnosis not present

## 2024-08-31 ENCOUNTER — Encounter: Payer: Self-pay | Admitting: Urology

## 2024-08-31 ENCOUNTER — Telehealth: Payer: Self-pay

## 2024-08-31 ENCOUNTER — Encounter: Payer: Self-pay | Admitting: Internal Medicine

## 2024-08-31 DIAGNOSIS — N390 Urinary tract infection, site not specified: Secondary | ICD-10-CM

## 2024-08-31 DIAGNOSIS — N8110 Cystocele, unspecified: Secondary | ICD-10-CM

## 2024-08-31 MED ORDER — CEPHALEXIN 500 MG PO CAPS: 500.0000 mg | ORAL_CAPSULE | Freq: Three times a day (TID) | ORAL | 0 refills | Status: AC

## 2024-08-31 NOTE — Telephone Encounter (Signed)
 Spoke with advised her to call her urology

## 2024-08-31 NOTE — Telephone Encounter (Signed)
 Pt called in with c/o frequecy and urgency, and pain with urination that feels like electrical shocks times one day. She was up every 30 mins to an hour last night and she states having to stay on the toilet because its so frequent and not being able to completely drain all at once. After speaking with Sam the PA about her home situation of being home bound we are sending in antibiotics but if these antibiotics do not help we need to have a family member of hers to come put up a urine cup for her so they can bring us  a sample back for culture. I will mychart message the patient to let her know the plan.

## 2024-09-07 ENCOUNTER — Other Ambulatory Visit: Payer: Self-pay | Admitting: Internal Medicine

## 2024-10-04 ENCOUNTER — Other Ambulatory Visit: Payer: Self-pay | Admitting: Internal Medicine

## 2024-10-22 DIAGNOSIS — J449 Chronic obstructive pulmonary disease, unspecified: Secondary | ICD-10-CM | POA: Diagnosis not present

## 2024-10-31 ENCOUNTER — Telehealth: Payer: Self-pay

## 2024-10-31 ENCOUNTER — Other Ambulatory Visit: Payer: Self-pay | Admitting: Internal Medicine

## 2024-10-31 NOTE — Telephone Encounter (Signed)
 Lmom that pt need appt for physical

## 2024-11-19 ENCOUNTER — Encounter: Payer: Self-pay | Admitting: Internal Medicine

## 2024-11-19 ENCOUNTER — Telehealth: Admitting: Internal Medicine

## 2024-11-19 ENCOUNTER — Telehealth (INDEPENDENT_AMBULATORY_CARE_PROVIDER_SITE_OTHER): Payer: Self-pay

## 2024-11-19 VITALS — Ht 67.5 in

## 2024-11-19 DIAGNOSIS — Z6841 Body Mass Index (BMI) 40.0 and over, adult: Secondary | ICD-10-CM

## 2024-11-19 DIAGNOSIS — I10 Essential (primary) hypertension: Secondary | ICD-10-CM

## 2024-11-19 DIAGNOSIS — G4733 Obstructive sleep apnea (adult) (pediatric): Secondary | ICD-10-CM | POA: Diagnosis not present

## 2024-11-19 DIAGNOSIS — Z0001 Encounter for general adult medical examination with abnormal findings: Secondary | ICD-10-CM | POA: Diagnosis not present

## 2024-11-19 DIAGNOSIS — E038 Other specified hypothyroidism: Secondary | ICD-10-CM | POA: Diagnosis not present

## 2024-11-19 MED ORDER — ZEPBOUND 2.5 MG/0.5ML ~~LOC~~ SOAJ: 2.5000 mg | SUBCUTANEOUS | 1 refills | Status: DC

## 2024-11-19 NOTE — Telephone Encounter (Signed)
 She can hold but she should discuss with her OBGYN any vaginal bleeding as we have discussed extensively before

## 2024-11-19 NOTE — Telephone Encounter (Signed)
 Patient was notified with medical recommendations and verbalized understanding

## 2024-11-19 NOTE — Telephone Encounter (Signed)
 Patient left a message stating that she has been experiencing vaginal bleeding for few days and asking of she could stop taking the medication for one day and restart the next day.She previously experience the same situation and bleeding did stop. Please Advise

## 2024-11-19 NOTE — Progress Notes (Signed)
 [facility] [address] [place], [place] [address]  Internal MEDICINE  Telephone Visit  Patient Name: [name]  [id number]  [id number]  Date of Service: 11/19/2024  I connected with the patient at 1 by telephone and verified the patients identity using two identifiers.   I discussed the limitations, risks, security and privacy concerns of performing an evaluation and management service by telephone and the availability of in person appointments. I also discussed with the patient that there may be a patient responsible charge related to the service.  The patient expressed understanding and agrees to proceed.    Chief Complaint  Patient presents with   Annual Exam        Telephone Screen    [id number]   Telephone Assessment    HPI  Pt is connected for AWV, she has chronic and complicated medical history, sees multiple specialist, she is concerned about the following at this visit  Pt is trying Keto diet, one meal a day. Consuming about 1300 kCAL Difficulty losing weight  Has problem with cpap machine, thinks it might be broke Seen by Endo for Hashimoto and is on Synthroid   Seen by urology  Seen by ortho, as unable to perform ADL   Current Medication: Outpatient Encounter Medications as of 11/19/2024  Medication Sig   apixaban  (ELIQUIS ) 5 MG TABS tablet Take 1 tablet (5 mg total) by mouth 2 (two) times daily.   Cholecalciferol 50 MCG (2000 UT) CAPS Take by mouth.   Cranberry 1000 MG CAPS Take 1 tablet by mouth 2 (two) times daily.   famotidine  (PEPCID ) 20 MG tablet TAKE 1 TABLET BY MOUTH TWICE A DAY   folic acid  (FOLVITE ) 1 MG tablet TAKE 1 TABLET BY MOUTH EVERY DAY   ivermectin  (STROMECTOL ) 3 MG TABS tablet Take 24 mg by mouth daily.   levonorgestrel  (MIRENA ) 20 MCG/DAY IUD by Intrauterine route.   levothyroxine  (SYNTHROID ) 137 MCG tablet Take 125 mcg by mouth daily before breakfast.   MAGNESIUM CHLORIDE PO Take 143 mg by mouth. Two tablets daily   NON  FORMULARY Take 1 tablet by mouth daily. Shaklee Mens daily vitamin. NO IRON   potassium chloride  (KLOR-CON  M) 10 MEQ tablet TAKE 1 TABLET BY MOUTH DAILY AND TAKE 2 TABLETS ON WEDNESDAYS   potassium chloride  (MICRO-K ) 10 MEQ CR capsule Take 10 mEq by mouth daily. Takes two on weds   Probiotic Product (PROBIOTIC ADVANCED PO) Take 1 capsule by mouth daily.   tirzepatide  (ZEPBOUND ) 2.5 MG/0.5ML Pen Inject 2.5 mg into the skin once a week.   No facility-administered encounter medications on file as of 11/19/2024.    Surgical History: Past Surgical History:  Procedure Laterality Date   ADENOIDECTOMY     Small child   APPENDECTOMY     Age 8   COLON SURGERY  1980   ileostomy and internal pouch age 52   CORONARY ARTERY BYPASS GRAFT     CORONARY/GRAFT ACUTE MI REVASCULARIZATION N/A 06/12/2021   Procedure: Coronary/Graft Acute MI Revascularization;  Surgeon: Ammon Blunt, MD;  Location: [facility];  Service: Cardiovascular;  Laterality: N/A;   DILATATION & CURETTAGE/HYSTEROSCOPY WITH MYOSURE N/A 05/02/2015   Procedure: DILATATION & CURETTAGE/HYSTEROSCOPY WITH MYOSURE;  Surgeon: Glory High, MD;  Location: [facility] ORS;  Service: Gynecology;  Laterality: N/A;   DILATION AND CURETTAGE OF UTERUS     LAPAROSCOPIC GASTRIC SLEEVE RESECTION  07/06/2016   LEFT HEART CATH AND CORONARY ANGIOGRAPHY N/A 06/12/2021   Procedure: LEFT HEART CATH AND  CORONARY ANGIOGRAPHY;  Surgeon: Ammon Blunt, MD;  Location: [facility];  Service: Cardiovascular;  Laterality: N/A;    Medical History: Past Medical History:  Diagnosis Date   Berger's disease 2011   Buerger's disease    Decreased functional mobility    Dyspnea    Family history of adverse reaction to anesthesia    sister and daughter difficult to wake up   Folic acid  deficiency    GERD (gastroesophageal reflux disease)    Hypertension    Hypothyroidism    IDA (iron deficiency anemia)    Osteoarthritis    Peripheral  vascular disease    PVC's (premature ventricular contractions)    Sleep apnea    Splenic infarct    Thrombocytosis    Ulcerative (chronic) enterocolitis (HCC)    Vitamin D  deficiency     Family History: Family History  Problem Relation Age of Onset   Clotting disorder Mother    Hypertension Mother    Congestive Heart Failure Father    Hypertension Father    Stroke Sister    Rheum arthritis Sister    Skin cancer Maternal Aunt        Basal cell   Skin cancer Maternal Uncle        Basal cell   Congestive Heart Failure Maternal Grandmother    Colon cancer Maternal Grandfather 85   Skin cancer Cousin        Basal Cell   Breast cancer Neg Hx     Social History   Socioeconomic History   Marital status: Married    Spouse name: Not on file   Number of children: Not on file   Years of education: Not on file   Highest education level: Not on file  Occupational History   Not on file  Tobacco Use   Smoking status: Former    Current packs/day: 0.00    Types: Cigarettes    Quit date: 04/28/2010    Years since quitting: 14.5   Smokeless tobacco: Never  Vaping Use   Vaping status: Never Used  Substance and Sexual Activity   Alcohol use: No    Alcohol/week: 0.0 standard drinks of alcohol   Drug use: No   Sexual activity: Yes    Birth control/protection: None, I.U.D.  Other Topics Concern   Not on file  Social History Narrative   Not on file   Social Drivers of Health   Financial Resource Strain: Not on file  Food Insecurity: Not on file  Transportation Needs: Not on file  Physical Activity: Not on file  Stress: Not on file  Social Connections: Not on file  Intimate Partner Violence: Not on file      Review of Systems  Constitutional:  Negative for fatigue and fever.  HENT:  Negative for congestion, mouth sores and postnasal drip.   Respiratory:  Negative for cough.   Cardiovascular:  Negative for chest pain.  Genitourinary:  Negative for flank pain.   Psychiatric/Behavioral: Negative.      Vital Signs: Ht 5' 7.5 (1.715 m)   BMI 50.31 kg/m    Observation/Objective: Pt is awake and alert, NAD     Assessment/Plan: 1. Encounter for general adult medical examination with abnormal findings (Primary) All labs are updated  - CBC with Differential/Platelet - Lipid Panel With LDL/HDL Ratio - TSH - T4, free - Comprehensive metabolic panel with GFR  2. BMI 50.0-59.9, adult (HCC) Will start Zepbound , not sure if insurance will approve   3. Other  specified hypothyroidism As per endocrinology  Continue synthroid   - TSH - T4, free   4. Essential hypertension Controlled    5. OSA (obstructive sleep apnea) Will refer her to sleep clinic to look into her pressure/ might need a new machine  - For home use only DME continuous positive airway pressure (CPAP)   General Counseling: [place] understanding of the findings of today's phone visit and agrees with plan of treatment. I have discussed any further diagnostic evaluation that may be needed or ordered today. We also reviewed her medications today. she has been encouraged to call the office with any questions or concerns that should arise related to todays visit.    Orders Placed This Encounter  Procedures   CBC with Differential/Platelet   Lipid Panel With LDL/HDL Ratio   TSH   T4, free   Comprehensive metabolic panel with GFR    Meds ordered this encounter  Medications   tirzepatide  (ZEPBOUND ) 2.5 MG/0.5ML Pen    Sig: Inject 2.5 mg into the skin once a week.    Dispense:  2 mL    Refill:  1    Time spent:30 Minutes    Dr Sigrid CHRISTELLA [facility]

## 2024-11-20 ENCOUNTER — Telehealth: Payer: Self-pay | Admitting: Internal Medicine

## 2024-11-20 ENCOUNTER — Encounter: Payer: Self-pay | Admitting: Advanced Practice Midwife

## 2024-11-20 NOTE — Telephone Encounter (Signed)
 Order for possible cpap replacement emailed to [name]

## 2024-11-20 NOTE — Telephone Encounter (Signed)
 TRIAGE VOICEMAIL: Patient states she has finished menopause. She has been bleeding for a week. The soonest appointment is 12/12/24. She is inquiring if this is ok or if she needs to be seen sooner.

## 2024-11-20 NOTE — Telephone Encounter (Signed)
 This encounter was created in error - please disregard.

## 2024-11-21 DIAGNOSIS — J449 Chronic obstructive pulmonary disease, unspecified: Secondary | ICD-10-CM | POA: Diagnosis not present

## 2024-11-21 NOTE — Telephone Encounter (Signed)
 Will she be fine to wait until 12/31? Should she see a DR or does Slater see postmenopausal bleeding?

## 2024-11-21 NOTE — Telephone Encounter (Signed)
 Does Roby or Starla have anything sooner then 12/31? To see this pt for postmenopausal bleeding?

## 2024-11-23 NOTE — Progress Notes (Unsigned)
 GYNECOLOGY PROGRESS NOTE  Subjective:  PCP: Fernand Sigrid HERO, MD  Patient ID: [name], female    DOB: 01/07/1966, 58 y.o.   MRN: [id number]  HPI  Patient is a 58 y.o. G27P2002 female who presents for postmenopausal bleeding.  Last saw Dr. Connell last year in November. 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 Staebler.  Follow up EMB 05/2018 negative. Patient reports she had an IUD for 10 years with no bleeding.  It expired and was removed and at that time she was told she was menopausal with an FSH of 52.8. In Oct '24, after about 3 months without the IUD, she noted blood on her toilet paper and had an ultrasound, EMB and IUD was reinserted.  TVUS showed endometrium 5mm with a small fibroid and nabothian cysts.  EMB was negative for atypia/hyperplasia.    Currently, also having some intermittent vaginal bleeding for a few weeks along with a little vaginal itching.  She used an over the counter yeast infection medication which didn't really help.  She is still taking Eliquis .   {Common ambulatory SmartLinks:19316}  Review of Systems {ros; complete:30496}   Objective:   Blood pressure (!) 138/54, pulse 91. There is no height or weight on file to calculate BMI. Pt in wheelchair  General appearance: {general exam:16600} Abdomen: {abdominal exam:16834} Pelvic: {pelvic exam:16852::cervix normal in appearance,external genitalia normal,no adnexal masses or tenderness,no cervical motion tenderness,rectovaginal septum normal,uterus normal size, shape, and consistency,vagina normal without discharge} Extremities: {extremity exam:5109} Neurologic: {neuro exam:17854}  Endometrial Biopsy Procedure Note  The patient was positioned on the exam table in the dorsal lithotomy position. Bimanual exam confirmed uterine position and size. A Graves speculum was placed into the vagina. A single toothed tenaculum was placed onto the anterior lip  of the cervix. The pipette was placed into the endocervical canal and advanced to the uterine fundus. Using a piston like technique, with vacuum created by withdrawing the stylus, the endometrial specimen was obtained and transferred to the biopsy container. Minimal bleeding encountered. The procedure was well tolerated.   Uterine Position: mid   Uterine Length: 7cm   Uterine Specimen: Scant   Ultrasound 10/11/23 EXAM: TRANSABDOMINAL AND TRANSVAGINAL ULTRASOUND OF PELVIS   TECHNIQUE: Both transabdominal and transvaginal ultrasound examinations of the pelvis were performed. Transabdominal technique was performed for global imaging of the pelvis including uterus, ovaries, adnexal regions, and pelvic cul-de-sac. It was necessary to proceed with endovaginal exam following the transabdominal exam to visualize the uterus, endometrium, ovaries and adnexal regions.   COMPARISON:  07/13/2023.   FINDINGS: Uterus   Measurements: 8.3 x 3.6 x 3.3 cm = volume: 52 mL. Possible small 4 mm fibroid in the uterine body. Nabothian cysts.   Endometrium   Thickness: 5 mm.  No focal abnormality visualized.   Right ovary   Measurements: 1.7 x 0.9 x 1.6 cm = volume: 1.3 mL. Normal appearance/no adnexal mass.   Left ovary   Measurements: 2.5 x 1.1 x 1.8 cm = volume: 2.6 mL. Normal appearance/no adnexal mass.   Other findings   No abnormal free fluid.   IMPRESSION: Endometrium measures 5 mm, within normal limits for a postmenopausal female in the absence of abnormal uterine bleeding. In the setting of post-menopausal bleeding, endometrial sampling is indicated to exclude carcinoma. If results are benign, sonohysterogram should be considered for focal lesion work-up.  SURGICAL PATHOLOGY  CASE: [id number]  PATIENT: [name]  Surgical Pathology Report  Clinical History: PMB (cm)   FINAL MICROSCOPIC DIAGNOSIS:   A. ENDOMETRIUM, BIOPSY:  -  Scant strips of atrophic-appearing  endometrium and blood.  -  Negative for atypia/hyperplasia on limited material.    Assessment/Plan:   1. Postmenopausal bleeding   2. Vaginal itching      There are no diagnoses linked to this encounter.     [name], DO [facility]

## 2024-11-25 ENCOUNTER — Other Ambulatory Visit: Payer: Self-pay | Admitting: Internal Medicine

## 2024-11-26 ENCOUNTER — Other Ambulatory Visit: Payer: Self-pay | Admitting: Internal Medicine

## 2024-11-26 ENCOUNTER — Other Ambulatory Visit: Payer: Self-pay

## 2024-11-26 MED ORDER — TIRZEPATIDE-WEIGHT MANAGEMENT 2.5 MG/0.5ML ~~LOC~~ SOLN: 2.5000 mg | SUBCUTANEOUS | 0 refills | Status: AC

## 2024-11-28 ENCOUNTER — Other Ambulatory Visit (HOSPITAL_COMMUNITY)
Admission: RE | Admit: 2024-11-28 | Discharge: 2024-11-28 | Disposition: A | Source: Ambulatory Visit | Attending: Obstetrics | Admitting: Obstetrics

## 2024-11-28 ENCOUNTER — Encounter: Payer: Self-pay | Admitting: Obstetrics

## 2024-11-28 ENCOUNTER — Ambulatory Visit: Admitting: Obstetrics

## 2024-11-28 VITALS — BP 138/54 | HR 91

## 2024-11-28 DIAGNOSIS — N950 Postmenopausal bleeding: Secondary | ICD-10-CM

## 2024-11-28 DIAGNOSIS — N8501 Benign endometrial hyperplasia: Secondary | ICD-10-CM

## 2024-11-28 DIAGNOSIS — N898 Other specified noninflammatory disorders of vagina: Secondary | ICD-10-CM

## 2024-11-28 DIAGNOSIS — E66813 Obesity, class 3: Secondary | ICD-10-CM | POA: Diagnosis not present

## 2024-11-28 DIAGNOSIS — Z9229 Personal history of other drug therapy: Secondary | ICD-10-CM

## 2024-11-28 DIAGNOSIS — N858 Other specified noninflammatory disorders of uterus: Secondary | ICD-10-CM

## 2024-11-29 ENCOUNTER — Other Ambulatory Visit (INDEPENDENT_AMBULATORY_CARE_PROVIDER_SITE_OTHER): Payer: Self-pay | Admitting: Nurse Practitioner

## 2024-11-29 ENCOUNTER — Encounter: Payer: Self-pay | Admitting: Internal Medicine

## 2024-11-29 MED ORDER — APIXABAN 5 MG PO TABS: 5.0000 mg | ORAL_TABLET | Freq: Two times a day (BID) | ORAL | 3 refills | Status: AC

## 2024-11-30 LAB — SURGICAL PATHOLOGY

## 2024-12-03 ENCOUNTER — Other Ambulatory Visit: Payer: Self-pay

## 2024-12-03 ENCOUNTER — Encounter: Payer: Self-pay | Admitting: Obstetrics

## 2024-12-03 ENCOUNTER — Encounter: Payer: Self-pay | Admitting: Internal Medicine

## 2024-12-03 ENCOUNTER — Telehealth: Payer: Self-pay

## 2024-12-03 MED ORDER — AMOXICILLIN 500 MG PO CAPS: 500.0000 mg | ORAL_CAPSULE | Freq: Every day | ORAL | 0 refills | Status: AC

## 2024-12-03 NOTE — Telephone Encounter (Signed)
 Pt called that she having pain in her throat right side and gland and neck  swollen and no cough as per dr fernand sent amoxicillin  and advised not feeling better need appt

## 2024-12-04 ENCOUNTER — Other Ambulatory Visit: Payer: Self-pay

## 2024-12-04 ENCOUNTER — Other Ambulatory Visit: Payer: Self-pay | Admitting: Internal Medicine

## 2024-12-12 ENCOUNTER — Ambulatory Visit: Admitting: Advanced Practice Midwife

## 2024-12-17 NOTE — Telephone Encounter (Signed)
 Patient will come 12/20/24 for repeat EMB.

## 2024-12-17 NOTE — Telephone Encounter (Signed)
 TRIAGE VOICEMIAL: Patient returning Katie's phone call.

## 2024-12-17 NOTE — Telephone Encounter (Signed)
 Patient has been scheduled for 12/20/24.  Attempted to call but no answer.  Left message asking her to return the call to clinic.

## 2024-12-20 ENCOUNTER — Ambulatory Visit: Admitting: Obstetrics

## 2024-12-20 ENCOUNTER — Encounter: Payer: Self-pay | Admitting: Obstetrics

## 2024-12-20 ENCOUNTER — Ambulatory Visit

## 2024-12-20 ENCOUNTER — Other Ambulatory Visit (HOSPITAL_COMMUNITY)
Admission: RE | Admit: 2024-12-20 | Discharge: 2024-12-20 | Disposition: A | Source: Ambulatory Visit | Attending: Obstetrics | Admitting: Obstetrics

## 2024-12-20 VITALS — BP 123/55 | HR 79

## 2024-12-20 DIAGNOSIS — N950 Postmenopausal bleeding: Secondary | ICD-10-CM

## 2024-12-20 DIAGNOSIS — N8501 Benign endometrial hyperplasia: Secondary | ICD-10-CM | POA: Insufficient documentation

## 2024-12-20 DIAGNOSIS — E66813 Obesity, class 3: Secondary | ICD-10-CM

## 2024-12-20 DIAGNOSIS — N858 Other specified noninflammatory disorders of uterus: Secondary | ICD-10-CM | POA: Diagnosis not present

## 2024-12-20 DIAGNOSIS — Z9229 Personal history of other drug therapy: Secondary | ICD-10-CM

## 2024-12-20 NOTE — Progress Notes (Unsigned)
" ° ° °  GYNECOLOGY PROGRESS NOTE  Subjective:  PCP: Fernand Sigrid HERO, MD  Patient ID: [name], female    DOB: Jan 10, 1966, 59 y.o.   MRN: [id number]  HPI  Patient is a 59 y.o. G56P2002 female who presents for repeat endometrial biopsy.  She was seen 11/28/24 for PMB and an endometrial biopsy was done but no endometrial tissue was found in the specimen.  {Common ambulatory SmartLinks:19316}  Review of Systems {ros; complete:30496}   Objective:   Blood pressure (!) 123/55, pulse 79. There is no height or weight on file to calculate BMI.  General appearance: {general exam:16600} Abdomen: {abdominal exam:16834} Pelvic: {pelvic exam:16852::cervix normal in appearance,external genitalia normal,no adnexal masses or tenderness,no cervical motion tenderness,rectovaginal septum normal,uterus normal size, shape, and consistency,vagina normal without discharge} Extremities: {extremity exam:5109} Neurologic: {neuro exam:17854}  Pathology 11/28/24 FINAL MICROSCOPIC DIAGNOSIS:   A. ENDOMETRIUM, BIOPSY:  -  Predominantly mucus and acute inflammatory cells with strips of  endocervix and ectocervix, negative for dysplasia.  -  No endometrial tissue is present in the biopsy specimen.   Assessment/Plan:   1. Endometrial hyperplasia without atypia, simple   2. Postmenopausal bleeding      There are no diagnoses linked to this encounter.     Joanne Mangle, DO [facility] "

## 2024-12-24 LAB — SURGICAL PATHOLOGY

## 2024-12-25 ENCOUNTER — Telehealth: Admitting: Internal Medicine

## 2024-12-25 ENCOUNTER — Ambulatory Visit: Payer: Self-pay | Admitting: Obstetrics

## 2025-01-02 ENCOUNTER — Other Ambulatory Visit
Admission: RE | Admit: 2025-01-02 | Discharge: 2025-01-02 | Disposition: A | Discharge status: Home / Self Care | Source: Ambulatory Visit | Attending: Internal Medicine | Admitting: Internal Medicine

## 2025-01-02 ENCOUNTER — Other Ambulatory Visit: Payer: Self-pay | Admitting: Internal Medicine

## 2025-01-02 ENCOUNTER — Ambulatory Visit: Payer: Self-pay | Admitting: Obstetrics

## 2025-01-02 DIAGNOSIS — E039 Hypothyroidism, unspecified: Secondary | ICD-10-CM | POA: Diagnosis not present

## 2025-01-02 DIAGNOSIS — I10 Essential (primary) hypertension: Secondary | ICD-10-CM | POA: Insufficient documentation

## 2025-01-02 DIAGNOSIS — Z6841 Body Mass Index (BMI) 40.0 and over, adult: Secondary | ICD-10-CM | POA: Insufficient documentation

## 2025-01-02 DIAGNOSIS — E538 Deficiency of other specified B group vitamins: Secondary | ICD-10-CM

## 2025-01-02 LAB — LIPID PANEL
Cholesterol: 122 mg/dL (ref 0–200)
HDL: 42 mg/dL
LDL Cholesterol: 69 mg/dL (ref 0–99)
Total CHOL/HDL Ratio: 2.9 ratio
Triglycerides: 56 mg/dL
VLDL: 11 mg/dL (ref 0–40)

## 2025-01-02 LAB — CBC WITH DIFFERENTIAL/PLATELET
Abs Immature Granulocytes: 0.03 K/uL (ref 0.00–0.07)
Basophils Absolute: 0 K/uL (ref 0.0–0.1)
Basophils Relative: 0 %
Eosinophils Absolute: 0.2 K/uL (ref 0.0–0.5)
Eosinophils Relative: 2 %
HCT: 44.2 % (ref 36.0–46.0)
Hemoglobin: 14.7 g/dL (ref 12.0–15.0)
Immature Granulocytes: 0 %
Lymphocytes Relative: 38 %
Lymphs Abs: 3.2 K/uL (ref 0.7–4.0)
MCH: 31.5 pg (ref 26.0–34.0)
MCHC: 33.3 g/dL (ref 30.0–36.0)
MCV: 94.6 fL (ref 80.0–100.0)
Monocytes Absolute: 0.8 K/uL (ref 0.1–1.0)
Monocytes Relative: 10 %
Neutro Abs: 4.1 K/uL (ref 1.7–7.7)
Neutrophils Relative %: 50 %
Platelets: 212 K/uL (ref 150–400)
RBC: 4.67 MIL/uL (ref 3.87–5.11)
RDW: 14.5 % (ref 11.5–15.5)
WBC: 8.3 K/uL (ref 4.0–10.5)
nRBC: 0 % (ref 0.0–0.2)

## 2025-01-02 LAB — COMPREHENSIVE METABOLIC PANEL WITH GFR
ALT: 9 U/L (ref 0–44)
AST: 14 U/L — ABNORMAL LOW (ref 15–41)
Albumin: 4.1 g/dL (ref 3.5–5.0)
Alkaline Phosphatase: 101 U/L (ref 38–126)
Anion gap: 16 — ABNORMAL HIGH (ref 5–15)
BUN: 7 mg/dL (ref 6–20)
CO2: 25 mmol/L (ref 22–32)
Calcium: 9.6 mg/dL (ref 8.9–10.3)
Chloride: 98 mmol/L (ref 98–111)
Creatinine, Ser: 0.71 mg/dL (ref 0.44–1.00)
GFR, Estimated: >60 mL/min
Glucose, Bld: 87 mg/dL (ref 70–99)
Potassium: 3.7 mmol/L (ref 3.5–5.1)
Sodium: 140 mmol/L (ref 135–145)
Total Bilirubin: 0.9 mg/dL (ref 0.0–1.2)
Total Protein: 7.5 g/dL (ref 6.5–8.1)

## 2025-01-02 LAB — TSH: TSH: <0.1 u[IU]/mL — ABNORMAL LOW (ref 0.350–4.500)

## 2025-01-02 LAB — T4, FREE: Free T4: 2.27 ng/dL — ABNORMAL HIGH (ref 0.80–2.00)

## 2025-01-08 ENCOUNTER — Ambulatory Visit: Payer: Self-pay | Admitting: Internal Medicine

## 2025-01-08 NOTE — Progress Notes (Signed)
 Can you find out who is managing her thyroid  disorder, her dose of synthroid  needs to be reduced

## 2025-01-09 NOTE — Progress Notes (Signed)
 Thank you :)
